# Patient Record
Sex: Female | Born: 1980 | Race: White | Hispanic: No | Smoking: Never smoker
Health system: Southern US, Community
[De-identification: ages and names within clinical notes are randomized; demographics above are authoritative.]

## PROBLEM LIST (undated history)

## (undated) DIAGNOSIS — E861 Hypovolemia: Secondary | ICD-10-CM

## (undated) DIAGNOSIS — G901 Familial dysautonomia [Riley-Day]: Secondary | ICD-10-CM

## (undated) DIAGNOSIS — E869 Volume depletion, unspecified: Secondary | ICD-10-CM

## (undated) DIAGNOSIS — K219 Gastro-esophageal reflux disease without esophagitis: Secondary | ICD-10-CM

## (undated) DIAGNOSIS — D649 Anemia, unspecified: Secondary | ICD-10-CM

## (undated) DIAGNOSIS — Z950 Presence of cardiac pacemaker: Secondary | ICD-10-CM

## (undated) DIAGNOSIS — R55 Syncope and collapse: Secondary | ICD-10-CM

## (undated) DIAGNOSIS — E538 Deficiency of other specified B group vitamins: Secondary | ICD-10-CM

## (undated) DIAGNOSIS — I10 Essential (primary) hypertension: Secondary | ICD-10-CM

## (undated) HISTORY — DX: Gastro-esophageal reflux disease without esophagitis: K21.9

## (undated) HISTORY — PX: LAPAROSCOPIC ROUX-EN-Y GASTRIC BYPASS WITH UPPER ENDOSCOPY AND REMOVAL OF LAP BAND: SHX6505

## (undated) HISTORY — DX: Essential (primary) hypertension: I10

## (undated) HISTORY — DX: Presence of cardiac pacemaker: Z95.0

## (undated) HISTORY — DX: Deficiency of other specified B group vitamins: E53.8

## (undated) HISTORY — PX: CHOLECYSTECTOMY: SHX55

## (undated) HISTORY — PX: OTHER SURGICAL HISTORY: SHX169

## (undated) HISTORY — PX: TONSILLECTOMY AND ADENOIDECTOMY: SHX28

## (undated) HISTORY — PX: LAPAROSCOPIC GASTRIC BANDING: SHX1100

## (undated) HISTORY — PX: ABDOMINAL HYSTERECTOMY: SHX81

## (undated) HISTORY — DX: Syncope and collapse: R55

## (undated) HISTORY — PX: TUBAL LIGATION: SHX77

## (undated) HISTORY — PX: PACEMAKER INSERTION: SHX728

---
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
## 2001-09-29 DIAGNOSIS — G901 Familial dysautonomia [Riley-Day]: Secondary | ICD-10-CM

## 2001-09-29 DIAGNOSIS — R55 Syncope and collapse: Secondary | ICD-10-CM

## 2001-09-29 HISTORY — DX: Syncope and collapse: R55

## 2001-09-29 HISTORY — DX: Familial dysautonomia (riley-day): G90.1

## 2002-02-02 ENCOUNTER — Emergency Department (HOSPITAL_COMMUNITY): Admission: EM | Admit: 2002-02-02 | Discharge: 2002-02-02 | Payer: Self-pay | Admitting: Emergency Medicine

## 2004-04-27 ENCOUNTER — Emergency Department (HOSPITAL_COMMUNITY): Admission: EM | Admit: 2004-04-27 | Discharge: 2004-04-27 | Payer: Self-pay | Admitting: *Deleted

## 2006-03-26 ENCOUNTER — Ambulatory Visit: Payer: Self-pay | Admitting: Internal Medicine

## 2009-12-11 ENCOUNTER — Ambulatory Visit: Payer: Self-pay | Admitting: Diagnostic Radiology

## 2009-12-11 ENCOUNTER — Emergency Department (HOSPITAL_BASED_OUTPATIENT_CLINIC_OR_DEPARTMENT_OTHER): Admission: EM | Admit: 2009-12-11 | Discharge: 2009-12-11 | Payer: Self-pay | Admitting: Emergency Medicine

## 2010-03-05 ENCOUNTER — Emergency Department (HOSPITAL_BASED_OUTPATIENT_CLINIC_OR_DEPARTMENT_OTHER): Admission: EM | Admit: 2010-03-05 | Discharge: 2010-03-05 | Payer: Self-pay | Admitting: Emergency Medicine

## 2010-03-05 ENCOUNTER — Ambulatory Visit: Payer: Self-pay | Admitting: Diagnostic Radiology

## 2010-12-16 LAB — CBC
HCT: 40.2 % (ref 36.0–46.0)
Hemoglobin: 13.7 g/dL (ref 12.0–15.0)
MCV: 89.4 fL (ref 78.0–100.0)
Platelets: 188 10*3/uL (ref 150–400)
RBC: 4.49 MIL/uL (ref 3.87–5.11)
RDW: 11.7 % (ref 11.5–15.5)
WBC: 9 10*3/uL (ref 4.0–10.5)

## 2010-12-16 LAB — DIFFERENTIAL
Basophils Absolute: 0.2 10*3/uL — ABNORMAL HIGH (ref 0.0–0.1)
Lymphs Abs: 3.2 10*3/uL (ref 0.7–4.0)

## 2010-12-16 LAB — BASIC METABOLIC PANEL
BUN: 13 mg/dL (ref 6–23)
CO2: 28 mEq/L (ref 19–32)
Calcium: 9.1 mg/dL (ref 8.4–10.5)
Chloride: 104 mEq/L (ref 96–112)
Potassium: 3.8 mEq/L (ref 3.5–5.1)

## 2010-12-16 LAB — D-DIMER, QUANTITATIVE: D-Dimer, Quant: 0.43 ug/mL-FEU (ref 0.00–0.48)

## 2010-12-23 LAB — BASIC METABOLIC PANEL WITH GFR
BUN: 11 mg/dL (ref 6–23)
GFR calc Af Amer: >60 mL/min (ref >60)
GFR calc non Af Amer: >60 mL/min (ref >60)

## 2010-12-23 LAB — URINALYSIS, ROUTINE W REFLEX MICROSCOPIC
Bilirubin Urine: NEGATIVE
Glucose, UA: NEGATIVE mg/dL
Hgb urine dipstick: NEGATIVE
Ketones, ur: NEGATIVE mg/dL
Nitrite: NEGATIVE
Protein, ur: NEGATIVE mg/dL
Specific Gravity, Urine: 1.017 (ref 1.005–1.030)
Urobilinogen, UA: 0.2 mg/dL (ref 0.0–1.0)
pH: 7 (ref 5.0–8.0)

## 2010-12-23 LAB — CBC
HCT: 42.4 % (ref 36.0–46.0)
Hemoglobin: 14.5 g/dL (ref 12.0–15.0)
MCHC: 34.2 g/dL (ref 30.0–36.0)
MCV: 90.3 fL (ref 78.0–100.0)
Platelets: 217 10*3/uL (ref 150–400)
RBC: 4.7 MIL/uL (ref 3.87–5.11)
RDW: 12.1 % (ref 11.5–15.5)
WBC: 6.7 K/uL (ref 4.0–10.5)

## 2010-12-23 LAB — DIFFERENTIAL
Basophils Absolute: 0.1 10*3/uL (ref 0.0–0.1)
Basophils Relative: 1 % (ref 0–1)
Eosinophils Absolute: 0.2 K/uL (ref 0.0–0.7)
Eosinophils Relative: 3 % (ref 0–5)
Lymphocytes Relative: 41 % (ref 12–46)
Lymphs Abs: 2.7 K/uL (ref 0.7–4.0)
Monocytes Absolute: 0.4 K/uL (ref 0.1–1.0)
Monocytes Relative: 6 % (ref 3–12)
Neutro Abs: 3.2 10*3/uL (ref 1.7–7.7)
Neutrophils Relative %: 50 % (ref 43–77)

## 2010-12-23 LAB — BASIC METABOLIC PANEL
CO2: 30 mEq/L (ref 19–32)
Calcium: 8.6 mg/dL (ref 8.4–10.5)
Chloride: 107 mEq/L (ref 96–112)
Creatinine, Ser: 0.6 mg/dL (ref 0.4–1.2)
Glucose, Bld: 111 mg/dL — ABNORMAL HIGH (ref 70–99)
Potassium: 4 mEq/L (ref 3.5–5.1)
Sodium: 146 mEq/L — ABNORMAL HIGH (ref 135–145)

## 2011-02-12 ENCOUNTER — Emergency Department (HOSPITAL_BASED_OUTPATIENT_CLINIC_OR_DEPARTMENT_OTHER)
Admission: EM | Admit: 2011-02-12 | Discharge: 2011-02-12 | Disposition: A | Discharge status: Home / Self Care | Payer: 59 | Attending: Emergency Medicine | Admitting: Emergency Medicine

## 2011-02-12 ENCOUNTER — Emergency Department (INDEPENDENT_AMBULATORY_CARE_PROVIDER_SITE_OTHER): Payer: 59

## 2011-02-12 DIAGNOSIS — N390 Urinary tract infection, site not specified: Secondary | ICD-10-CM | POA: Insufficient documentation

## 2011-02-12 DIAGNOSIS — R142 Eructation: Secondary | ICD-10-CM

## 2011-02-12 DIAGNOSIS — R110 Nausea: Secondary | ICD-10-CM

## 2011-02-12 DIAGNOSIS — E669 Obesity, unspecified: Secondary | ICD-10-CM | POA: Insufficient documentation

## 2011-02-12 DIAGNOSIS — R509 Fever, unspecified: Secondary | ICD-10-CM

## 2011-02-12 DIAGNOSIS — R109 Unspecified abdominal pain: Secondary | ICD-10-CM | POA: Insufficient documentation

## 2011-02-12 DIAGNOSIS — I4891 Unspecified atrial fibrillation: Secondary | ICD-10-CM | POA: Insufficient documentation

## 2011-02-12 LAB — DIFFERENTIAL
Basophils Absolute: 0 10*3/uL (ref 0.0–0.1)
Basophils Relative: 0 % (ref 0–1)
Eosinophils Absolute: 0.1 10*3/uL (ref 0.0–0.7)
Monocytes Relative: 8 % (ref 3–12)
Neutrophils Relative %: 46 % (ref 43–77)

## 2011-02-12 LAB — BASIC METABOLIC PANEL
Calcium: 9.1 mg/dL (ref 8.4–10.5)
Chloride: 102 mEq/L (ref 96–112)
Potassium: 4 mEq/L (ref 3.5–5.1)

## 2011-02-12 LAB — CBC
Hemoglobin: 14.2 g/dL (ref 12.0–15.0)
WBC: 6.2 10*3/uL (ref 4.0–10.5)

## 2011-02-12 LAB — URINE MICROSCOPIC-ADD ON

## 2011-02-12 LAB — URINALYSIS, ROUTINE W REFLEX MICROSCOPIC
Bilirubin Urine: NEGATIVE
Ketones, ur: NEGATIVE mg/dL
Nitrite: NEGATIVE
Protein, ur: 30 mg/dL — AB
Urobilinogen, UA: 0.2 mg/dL (ref 0.0–1.0)

## 2011-02-12 MED ORDER — IOHEXOL 300 MG/ML  SOLN
100.0000 mL | Freq: Once | INTRAMUSCULAR | Status: AC | PRN
  Administered 2011-02-12: 100 mL via INTRAVENOUS

## 2011-05-03 ENCOUNTER — Encounter: Payer: Self-pay | Admitting: Emergency Medicine

## 2011-05-03 ENCOUNTER — Emergency Department (HOSPITAL_BASED_OUTPATIENT_CLINIC_OR_DEPARTMENT_OTHER)
Admission: EM | Admit: 2011-05-03 | Discharge: 2011-05-03 | Disposition: A | Discharge status: Home / Self Care | Payer: 59 | Attending: Emergency Medicine | Admitting: Emergency Medicine

## 2011-05-03 DIAGNOSIS — L089 Local infection of the skin and subcutaneous tissue, unspecified: Secondary | ICD-10-CM | POA: Insufficient documentation

## 2011-05-03 DIAGNOSIS — IMO0002 Reserved for concepts with insufficient information to code with codable children: Secondary | ICD-10-CM | POA: Insufficient documentation

## 2011-05-03 DIAGNOSIS — L0390 Cellulitis, unspecified: Secondary | ICD-10-CM

## 2011-05-03 HISTORY — DX: Syncope and collapse: R55

## 2011-05-03 HISTORY — DX: Hypovolemia: E86.1

## 2011-05-03 HISTORY — DX: Volume depletion, unspecified: E86.9

## 2011-05-03 MED ORDER — DOXYCYCLINE HYCLATE 100 MG PO CAPS: 100.0000 mg | ORAL_CAPSULE | Freq: Two times a day (BID) | ORAL | Status: AC

## 2011-05-03 NOTE — ED Provider Notes (Signed)
History     CSN: [id number] Arrival date & time: 05/03/2011  1:01 PM  Chief Complaint  Patient presents with  . Wound Infection   HPI Comments: Pt states that her husband was recently hospitalized with mrsa/pt states that she was seen at her at her pcp office 2 days ago and they told her not anbiotics were needed at this time:pt states there was a white head on top on the area, but she picked it  Patient is a 30 y.o. female presenting with abscess. The history is provided by the patient. No language interpreter was used.  Abscess  This is a new problem. The current episode started less than one week ago. The onset was gradual. The problem has been gradually worsening. Affected Location: left elbow. The problem is moderate. The abscess is characterized by painfulness, redness and swelling. The abscess first occurred at home. Pertinent negatives include no fever. Her past medical history does not include atopy in family. Recently, medical care has been given by the PCP.    Past Medical History  Diagnosis Date  . Neurologic cardiac syncope   . Depletion of volume of extracellular fluid     History reviewed. No pertinent past surgical history.  History reviewed. No pertinent family history.  History  Substance Use Topics  . Smoking status: Never Smoker   . Smokeless tobacco: Not on file  . Alcohol Use: No    OB History    Grav Para Term Preterm Abortions TAB SAB Ect Mult Living                  Review of Systems  Constitutional: Negative for fever.  All other systems reviewed and are negative.    Physical Exam  BP 126/82  Pulse 96  Temp(Src) 99.6 F (37.6 C) (Oral)  Resp 16  SpO2 100%  Physical Exam  Nursing note and vitals reviewed. Constitutional: She is oriented to person, place, and time. She appears well-nourished.  HENT:  Head: Normocephalic.  Cardiovascular: Normal rate and regular rhythm.   Pulmonary/Chest: Effort normal and breath sounds normal.    Neurological: She is alert and oriented to person, place, and time. Coordination normal.  Skin:       ED Course  Procedures  MDM Pt requesting antibiotics without opening the area at this time      Teressa Lower, NP 05/03/11 1336

## 2011-05-03 NOTE — ED Provider Notes (Signed)
Medical screening examination/treatment/procedure(s) were performed by non-physician practitioner and as supervising physician I was immediately available for consultation/collaboration.   Yago Ludvigsen B. Bernette Mayers, MD 05/03/11 1339

## 2011-05-03 NOTE — ED Notes (Signed)
Pt has wound to left elbow.  Pt having pain and swelling to area.  No known fever.

## 2012-03-29 DIAGNOSIS — R9409 Abnormal results of other function studies of central nervous system: Secondary | ICD-10-CM | POA: Insufficient documentation

## 2012-04-05 DIAGNOSIS — Z950 Presence of cardiac pacemaker: Secondary | ICD-10-CM

## 2012-04-05 HISTORY — DX: Presence of cardiac pacemaker: Z95.0

## 2012-05-20 ENCOUNTER — Telehealth: Payer: Self-pay | Admitting: Internal Medicine

## 2012-05-20 NOTE — Telephone Encounter (Signed)
Forwarding to Dr. Klein. 

## 2012-05-20 NOTE — Telephone Encounter (Signed)
New msg Pt works for [employer] and wants to talk to you about her pacemaker. She cant get into her cardiologist until [phone]. She said her hr is in 140's please call

## 2012-05-28 NOTE — Telephone Encounter (Signed)
Dr. Klein spoke with the patient. 

## 2013-04-18 ENCOUNTER — Encounter: Payer: Self-pay | Admitting: Internal Medicine

## 2013-04-18 ENCOUNTER — Ambulatory Visit (INDEPENDENT_AMBULATORY_CARE_PROVIDER_SITE_OTHER): Payer: 59 | Admitting: Internal Medicine

## 2013-04-18 VITALS — BP 141/91 | HR 95 | Ht 70.5 in | Wt 257.2 lb

## 2013-04-18 DIAGNOSIS — Z950 Presence of cardiac pacemaker: Secondary | ICD-10-CM

## 2013-04-18 DIAGNOSIS — I10 Essential (primary) hypertension: Secondary | ICD-10-CM

## 2013-04-18 DIAGNOSIS — R55 Syncope and collapse: Secondary | ICD-10-CM

## 2013-04-18 NOTE — Progress Notes (Signed)
Slight dizziness.

## 2013-04-18 NOTE — Progress Notes (Signed)
Little dizziness @ 3 mins.

## 2013-04-18 NOTE — Patient Instructions (Addendum)
Your physician wants you to follow-up in: 4 MONTHS WITH DR Logan Bores will receive a reminder letter in the mail two months in advance. If you don't receive a letter, please call our office to schedule the follow-up appointment.

## 2013-04-18 NOTE — Progress Notes (Signed)
Little dizzy 

## 2013-04-18 NOTE — Progress Notes (Signed)
Little dizziness @ 2 mins.

## 2013-04-20 ENCOUNTER — Encounter: Payer: Self-pay | Admitting: Internal Medicine

## 2013-04-20 DIAGNOSIS — R55 Syncope and collapse: Secondary | ICD-10-CM | POA: Insufficient documentation

## 2013-04-20 DIAGNOSIS — Z950 Presence of cardiac pacemaker: Secondary | ICD-10-CM | POA: Insufficient documentation

## 2013-04-20 DIAGNOSIS — I10 Essential (primary) hypertension: Secondary | ICD-10-CM

## 2013-04-20 HISTORY — DX: Essential (primary) hypertension: I10

## 2013-04-20 NOTE — Assessment & Plan Note (Signed)
Long standing problem;  Will continue current course and arrange for consultation with the autonomic neurology service at [facility]

## 2013-04-20 NOTE — Assessment & Plan Note (Signed)
Will discontinue to infusion protocol; follow for now

## 2013-04-20 NOTE — Progress Notes (Signed)
ELECTROPHYSIOLOGY CONSULT NOTE  Patient ID: Erin Casey, MRN: [id number], DOB/AGE: 1981-08-14 32 y.o. Admit date: (Not on file) Date of Consult: 04/20/2013  Primary Physician: No primary provider on file. Primary Cardiologist: prev [facility]  Chief Complaint:  Neurocardiogenic syncope   HPI Erin Casey is a 32 y.o. female  witrh a 10 yr history of syncope with + tHUT 10 yrs ago and repeated last year with mixed response. She has been on multiple medications over the years as well as monthly Hespan+ 2L infusions She has shower intolerance heat intolerance; s/p hysterectomy ( I think i remember correctly) She underwent Biotronik CLS pacing last year following the HUT which showed significant bradycardia.  This was assoc with transient elimination of symptoms,  Even now with recurrence, however she has sufficient prodrome that she is able to lie herself down and avoid syncope  BP have become elevated  Past Medical History  Diagnosis Date  . Neurologic cardiac syncope   . Depletion of volume of extracellular fluid       Surgical History: No past surgical history on file.   Home Meds: Prior to Admission medications   Medication Sig Start Date End Date Taking? Authorizing Provider  methylphenidate (CONCERTA) 54 MG CR tablet Take 54 mg by mouth every morning.     Yes Historical Provider, MD  pantoprazole (PROTONIX) 20 MG tablet Take 20 mg by mouth daily.     Yes Historical Provider, MD    Allergies:  Allergies  Allergen Reactions  . Sulfa Antibiotics Rash    History   Social History  . Marital Status: Married    Spouse Name: N/A    Number of Children: N/A  . Years of Education: N/A   Occupational History  . Not on file.   Social History Main Topics  . Smoking status: Never Smoker   . Smokeless tobacco: Not on file  . Alcohol Use: No  . Drug Use:   . Sexually Active:    Other Topics Concern  . Not on file   Social History Narrative  . No narrative on file      No family history on file.   ROS:  Please see the history of present illness.     All other systems reviewed and negative.    Physical Exam:   Blood pressure 141/91, pulse 95, height 5' 10.5" (1.791 m), weight 257 lb 3.2 oz (116.665 kg). General: Well developed, well nourished female in no acute distress. Head: Normocephalic, atraumatic, sclera non-icteric, no xanthomas, nares are without discharge. EENT: normal Lymph Nodes:  none Back: without scoliosis/kyphosis , no CVA tendersness Neck: Negative for carotid bruits. JVD not elevated. Lungs: Clear bilaterally to auscultation without wheezes, rales, or rhonchi. Breathing is unlabored. Device pocket well healed; without hematoma or erythema.  There is no tethering Heart: RRR with S1 S2. No   murmur , rubs, or gallops appreciated. Abdomen: Soft, non-tender, non-distended with normoactive bowel sounds. No hepatomegaly. No rebound/guarding. No obvious abdominal masses. Msk:  Strength and tone appear normal for age. Extremities: No clubbing or cyanosis. No  edema.  Distal pedal pulses are 2+ and equal bilaterally. Skin: Warm and Dry Neuro: Alert and oriented X 3. CN III-XII intact Grossly normal sensory and motor function . Psych:  Responds to questions appropriately with a normal affect.      Labs: Cardiac Enzymes No results found for this basename: CKTOTAL, CKMB, TROPONINI,  in the last 72 hours CBC Lab Results  Component Value Date  WBC 6.2 02/12/2011   HGB 14.2 02/12/2011   HCT 40.2 02/12/2011   MCV 86.3 02/12/2011   PLT 202 02/12/2011   Chemistry   Radiology/Studies:  No results found.  EKG:  Normal sinus   Assessment and Plan:    Erin Casey

## 2013-04-20 NOTE — Assessment & Plan Note (Signed)
The patient's device was interrogated.  The information was reviewed. No changes were made in the programming.    

## 2013-04-21 LAB — PACEMAKER DEVICE OBSERVATION
AL AMPLITUDE: 5.8 mv
AL IMPEDENCE PM: 526 Ohm
AL THRESHOLD: 0.6 V
ATRIAL PACING PM: 51
DEVICE MODEL PM: 66303845
RV LEAD IMPEDENCE PM: 624 Ohm
VENTRICULAR PACING PM: 0

## 2013-04-22 ENCOUNTER — Other Ambulatory Visit: Payer: Self-pay | Admitting: *Deleted

## 2013-04-22 DIAGNOSIS — R55 Syncope and collapse: Secondary | ICD-10-CM

## 2013-05-09 ENCOUNTER — Encounter: Payer: 59 | Admitting: Internal Medicine

## 2013-05-10 ENCOUNTER — Encounter: Payer: Self-pay | Admitting: Cardiovascular Disease

## 2013-05-31 ENCOUNTER — Telehealth: Payer: Self-pay | Admitting: *Deleted

## 2013-05-31 NOTE — Telephone Encounter (Signed)
Spoke with Dr. Purnell Shoemaker office - pt having EEG on 10/1, after that they will determine scheduling office visit

## 2013-06-13 ENCOUNTER — Telehealth: Payer: Self-pay | Admitting: *Deleted

## 2013-06-13 NOTE — Telephone Encounter (Signed)
Patient states a cardiologist at [facility], Luella Cook, MD ordered her concerta 54 mg cr 3 years ago and she's been on it since then, she is no longer seeing that MD and is in process of having records transferred here, she is seeing Dr Graciela Husbands, she states she takes this medication to increase B/P and heart rate NOT for ADHD, she has 2 pills left and asks if Dr Graciela Husbands will refill this medication for her, I will route note to him.  Dr Graciela Husbands let me know and I will call her, order med if approved. Thanks, Addison Lank, RN Patient Advocate

## 2013-06-13 NOTE — Telephone Encounter (Signed)
Patient called in requesting refill on Concerta. She stated that she spoke to a nurse last week and was told that Dr. Graciela Husbands normally doesn't prescribe this, but that they would ask him and see. She never heard anything back, and she is down to 2 pills.

## 2013-06-14 NOTE — Telephone Encounter (Signed)
Hand written prescriptions left at front desk per pt request.

## 2013-06-30 ENCOUNTER — Telehealth: Payer: Self-pay | Admitting: Internal Medicine

## 2013-06-30 NOTE — Telephone Encounter (Signed)
New problem   Pt need a note stated she couldn't take her meds on 9/29 and 9/30 to prep for test she had done on 10/1. Please fax to [phone].

## 2013-07-01 ENCOUNTER — Encounter: Payer: Self-pay | Admitting: *Deleted

## 2013-07-01 NOTE — Telephone Encounter (Signed)
Follow up    Status of letter to be fax ,.

## 2013-07-01 NOTE — Telephone Encounter (Signed)
Faxed work excuse letter per request. Patient aware.

## 2013-07-04 ENCOUNTER — Telehealth: Payer: Self-pay | Admitting: *Deleted

## 2013-07-04 NOTE — Telephone Encounter (Signed)
Faxed work excuse to [phone] per patient request

## 2013-07-12 ENCOUNTER — Telehealth: Payer: Self-pay | Admitting: Internal Medicine

## 2013-07-12 NOTE — Telephone Encounter (Signed)
New Problem  Pt wants to make sure that the test results are in from [facility].

## 2013-07-14 NOTE — Telephone Encounter (Signed)
Let patient know that Dr Graciela Husbands will take a look at the records next week and we would be in touch. Patient agreeable with plan.

## 2013-07-20 ENCOUNTER — Telehealth: Payer: Self-pay | Admitting: *Deleted

## 2013-07-20 NOTE — Telephone Encounter (Signed)
I called patient to let her know that we received her test results from [facility]. Patient shared with me her disappointment in the doctor and his attitude. She states that he was very condescending to her and didn't even ask her about her medications. She states that when he first entered the room the first thing he says to her is that she is too young for a pacemaker. She says that she is cancelling her appointment on 10/31 because she does not want to visit with this doctor again. I explained that I would pass this along to Dr. Graciela Husbands and see about scheduling her an appointment for next month for follow up. Patient verbalized understanding and agreeable to plan.

## 2013-07-26 NOTE — Telephone Encounter (Signed)
LMTCB to schedule follow up office visit with Dr. Graciela Husbands in November.

## 2013-08-05 ENCOUNTER — Telehealth: Payer: Self-pay | Admitting: Internal Medicine

## 2013-08-05 NOTE — Telephone Encounter (Signed)
ROI faxed to [facility] at [phone]  08/05/13/Km

## 2013-08-09 ENCOUNTER — Encounter: Payer: Self-pay | Admitting: *Deleted

## 2013-08-18 ENCOUNTER — Ambulatory Visit (INDEPENDENT_AMBULATORY_CARE_PROVIDER_SITE_OTHER): Payer: 59 | Admitting: Internal Medicine

## 2013-08-18 ENCOUNTER — Encounter: Payer: Self-pay | Admitting: Internal Medicine

## 2013-08-18 ENCOUNTER — Encounter (INDEPENDENT_AMBULATORY_CARE_PROVIDER_SITE_OTHER): Payer: Self-pay

## 2013-08-18 ENCOUNTER — Encounter: Payer: Self-pay | Admitting: *Deleted

## 2013-08-18 VITALS — BP 117/79 | HR 88 | Ht 70.0 in | Wt 257.0 lb

## 2013-08-18 DIAGNOSIS — R55 Syncope and collapse: Secondary | ICD-10-CM

## 2013-08-18 DIAGNOSIS — Z950 Presence of cardiac pacemaker: Secondary | ICD-10-CM

## 2013-08-18 DIAGNOSIS — I10 Essential (primary) hypertension: Secondary | ICD-10-CM

## 2013-08-18 LAB — MDC_IDC_ENUM_SESS_TYPE_INCLINIC
Brady Statistic RV Percent Paced: 0 %
Lead Channel Impedance Value: 565 Ohm
Lead Channel Impedance Value: 604 Ohm
Lead Channel Pacing Threshold Amplitude: 0.5 V
Lead Channel Pacing Threshold Amplitude: 0.9 V
Lead Channel Pacing Threshold Pulse Width: 0.4 ms
Lead Channel Pacing Threshold Pulse Width: 0.4 ms

## 2013-08-18 NOTE — Progress Notes (Signed)
.  sk   ELECTROPHYSIOLOGY CONSULT NOTE  Patient ID: Erin Casey, MRN: [id number], DOB/AGE: 03-10-1981 32 y.o. Admit date: (Not on file) Date of Consult: 08/18/2013  Primary Physician: Brooke Bonito, MD Primary Cardiologist: prev [facility]  Chief Complaint:  Neurocardiogenic syncope   HPI Erin Casey is a 32 y.o. female  Seen in followup for neurocardiogenic syncope for which she recently underwent Biotronik pacemaker implantation. She has been treated with multiple different medications. She notes recently that she has developed alopecia which is identified and 10% of patients taking methylphenidate.  She is also significant problems with worsening dizziness without obvious extrinsic trigger. There has been no problems with intercurrent infection, psychosocial stress etc.  Her urine volume is copious and clear. She is not sure about her salt intake. It is her impression that she feels better with her blood pressure 130-40 range and most recently has been in the 110 range.   Past Medical History  Diagnosis Date  . Neurologic cardiac syncope   . Depletion of volume of extracellular fluid   . 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 Essential hypertension 04/20/2013      Surgical History: No past surgical history on file.   Home Meds: Prior to Admission medications   Medication Sig Start Date End Date Taking? Authorizing Provider  methylphenidate (CONCERTA) 54 MG CR tablet Take 54 mg by mouth every morning.     Yes Historical Provider, MD  pantoprazole (PROTONIX) 20 MG tablet Take 20 mg by mouth daily.     Yes Historical Provider, MD    Allergies:  Allergies  Allergen Reactions  . Sulfa Antibiotics Rash    History   Social History  . Marital Status: Married    Spouse Name: N/A    Number of Children: N/A  . Years of Education: N/A   Occupational History  . Not on file.   Social History Main Topics  . Smoking status: Never Smoker   . Smokeless tobacco: Not on file  .  Alcohol Use: No  . Drug Use:   . Sexual Activity:    Other Topics Concern  . Not on file   Social History Narrative  . No narrative on file     No family history on file.   ROS:  Please see the history of present illness.     All other systems reviewed and negative.    Physical Exam:   Blood pressure 117/79, pulse 88, height 5\' 10"  (1.778 m), weight 257 lb (116.574 kg). Well developed and nourished in no acute distress HENT normal Neck supple with JVP-flat Clear Regular rate and rhythm, no murmurs or gallops Abd-soft with active BS No Clubbing cyanosis edema Skin-warm and dry A & Oriented  Grossly normal sensory and motor function     Labs: Cardiac Enzymes No results found for this basename: CKTOTAL, CKMB, TROPONINI,  in the last 72 hours CBC Lab Results  Component Value Date   WBC 6.2 02/12/2011   HGB 14.2 02/12/2011   HCT 40.2 02/12/2011   MCV 86.3 02/12/2011   PLT 202 02/12/2011   Chemistry   Radiology/Studies:  No results found.  EKG:  Normal sinus   Assessment and Plan:    Sherryl Manges

## 2013-08-18 NOTE — Assessment & Plan Note (Signed)
As above.

## 2013-08-18 NOTE — Assessment & Plan Note (Signed)
The patient's device was interrogated.  The information was reviewed. No changes were made in the programming.    

## 2013-08-18 NOTE — Assessment & Plan Note (Signed)
She has been having more symptoms of lightheadedness. She notes in association with her blood pressure is in the 110 range versus the 1 30 range. She is taking methylphenidate for blood pressure as well as heart rate. Her pacemaker with CLS obviously need for heart rate. Alternatives may be used for methylphenidate as it seems to be associated with alopecia. We will leave her off of it.  We'll begin her ProAmatine 2.5 mg 3 times a day. We will also measure urine sodium excretion. She sounds like she is well-hydrated.

## 2013-08-18 NOTE — Patient Instructions (Addendum)
Your physician has recommended you make the following change in your medication:  1) Decrease Methylphenidate to 36 mg daily 2) Start Proamatine 2.5 mg three times a day   Remote monitoring is used to monitor your Pacemaker of ICD from home. This monitoring reduces the number of office visits required to check your device to one time per year. It allows [place] to keep an eye on the functioning of your device to ensure it is working properly. You are scheduled for a device check from home on 11/24/2013. You may send your transmission at any time that day. If you have a wireless device, the transmission will be sent automatically. After your physician reviews your transmission, you will receive a postcard with your next transmission date.  Your physician wants you to follow-up in: one year with Dr. Graciela Husbands.  You will receive a reminder letter in the mail two months in advance. If you don't receive a letter, please call our office to schedule the follow-up appointment.   Your physician recommends that you return for lab work in: 24 hour urine sodium

## 2013-08-19 ENCOUNTER — Telehealth: Payer: Self-pay | Admitting: Internal Medicine

## 2013-08-19 NOTE — Telephone Encounter (Signed)
Records REc From [facility] gave to scheduling Dept 08/19/13/KM

## 2013-08-23 ENCOUNTER — Other Ambulatory Visit: Payer: 59

## 2013-08-31 ENCOUNTER — Telehealth: Payer: Self-pay | Admitting: Internal Medicine

## 2013-08-31 NOTE — Telephone Encounter (Signed)
Pt was told that dr/nurse were out of the office today and will receive a call back tomorrow. Pt agreed to plan.

## 2013-08-31 NOTE — Telephone Encounter (Signed)
New Message   Pt called states that she was being weaned off of her medication/// She believes that she should stay on her medication because with out it she feels sick most of the time/// please call back to discuss.

## 2013-09-02 NOTE — Telephone Encounter (Signed)
Spoke with patient and advised her to resume previous dosage of Methylphenidate 54 mg daily. Patient states she began resumption of this two days ago. She is requesting new prescriptions to be written. I explained that we would get these to her next week. Patient agreeable to plan.

## 2013-09-02 NOTE — Telephone Encounter (Signed)
Follow Up:  Pt states she is still waiting on Sherri's call.

## 2013-09-09 NOTE — Telephone Encounter (Signed)
3 handwritten prescriptions mailed to patient. Dated 09/07/13, 10/08/2013, & 11/08/2013. Patient requested I mail them.

## 2013-10-04 ENCOUNTER — Telehealth: Payer: Self-pay | Admitting: *Deleted

## 2013-10-04 NOTE — Telephone Encounter (Signed)
Called pt to discuss 24 hour urine Dr. Caryl Comes recommended as last office visit. She states she will do it on a Sunday and drop off at our office Monday.

## 2013-11-03 ENCOUNTER — Telehealth: Payer: Self-pay | Admitting: Internal Medicine

## 2013-11-03 NOTE — Telephone Encounter (Signed)
Left message informing pt that short stay orders faxed over for 2L NS (1000cc/hr) once monthly prn.

## 2013-11-03 NOTE — Telephone Encounter (Signed)
New Problem:  Pt is requesting to have IV Fluids. Pt would like to speak to Sherri.

## 2013-11-21 ENCOUNTER — Encounter: Payer: 59 | Admitting: *Deleted

## 2013-12-06 ENCOUNTER — Encounter: Payer: Self-pay | Admitting: *Deleted

## 2014-03-01 ENCOUNTER — Encounter: Payer: Self-pay | Admitting: Cardiology

## 2014-07-07 ENCOUNTER — Encounter: Payer: Self-pay | Admitting: Cardiology

## 2014-08-23 ENCOUNTER — Encounter: Payer: Self-pay | Admitting: *Deleted

## 2014-09-26 ENCOUNTER — Encounter: Payer: Self-pay | Admitting: *Deleted

## 2014-10-27 ENCOUNTER — Encounter: Payer: Self-pay | Admitting: *Deleted

## 2014-11-23 ENCOUNTER — Encounter: Payer: Self-pay | Admitting: *Deleted

## 2014-12-28 ENCOUNTER — Encounter: Payer: Self-pay | Admitting: *Deleted

## 2015-01-24 ENCOUNTER — Encounter: Payer: Self-pay | Admitting: *Deleted

## 2015-11-16 ENCOUNTER — Encounter: Payer: Self-pay | Admitting: Physician Assistant

## 2015-11-16 DIAGNOSIS — G43009 Migraine without aura, not intractable, without status migrainosus: Secondary | ICD-10-CM | POA: Insufficient documentation

## 2015-11-16 DIAGNOSIS — E6609 Other obesity due to excess calories: Secondary | ICD-10-CM | POA: Insufficient documentation

## 2015-12-29 ENCOUNTER — Encounter: Payer: Self-pay | Admitting: Physician Assistant

## 2016-01-01 ENCOUNTER — Telehealth: Payer: Self-pay | Admitting: Internal Medicine

## 2016-01-01 NOTE — Telephone Encounter (Signed)
New Message  Pt stated that her pacer is keeping her HR very high  And SOB and wanted to know if she can come in before her appt w/ Renee. Please call back and discuss.

## 2016-01-01 NOTE — Telephone Encounter (Signed)
Spoke with [name]- she says that she has been SOB and having an increased HR since Saturday. She reports that she went to [facility] on Saturday where they checked her PPM and she says that they reported normal PPM function/ no changes made. Our office has not seen her since 07/2013. She says that she works for [employer] and coworkers put her on the monitor and she noticed a 3 second pause. She is working on getting the pause episode printed and will fax it to [place] and I can review it with Dr. Lovena Le. I have advised her that if her symptoms persist she should go to the ED- she is concerned that ED staff are not educated as to how to treat her PPM- I informed her that they can always contact a Biotron[employer] representative to the ED. She has an appt 01/03/16 at 8am with Tommye Standard, PA.  I will call her back if any recommendations are made after pause reviewed. She verbalizes understanding.

## 2016-01-02 NOTE — Progress Notes (Signed)
Cardiology Office Note Date:  01/03/2016  Patient ID:  Erin Casey, DOB 29-Dec-1980, MRN [id number] PCP:  Reita Cliche, MD  Cardiologist/Electrophysiologist: Dr. Caryl Comes    Chief Complaint:  Palpitations, reports of pause on a monitor, SOB  History of Present Illness: Erin Casey is a 35 y.o. female with history of neurocardiogenic syncopeawith PPM/CLS device, nd HTN comes to the office to seen for Dr. Caryl Comes.  She last saw him Nov 2014, at that time she was started on proamatine with worsening dizziness.  2.5mg  TID, though was subsequently stopped. Records note historically treated with many different medications including monthly Hespan and IVF infusions.  She has been followed with Dr. Tobie Poet since her last visit here secondary to her insurance coverage but would prefer to resume care here.  She was being seen and having her pacer checks regularly, last seen there sometime in the fall and has been doing quite well until this past weekend.  She comes to the office today c/o fast heart rates, increased dizziness and a near syncopal event.  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  She noted when she would feel like her heart was racing she put the monitor on and was atrial paced in 130's, this would persistent then she would feel lightheaded and SOB.  It is her perception that this is much different then her historical symptoms, in that the tachycardia is the provoking or first symptom and has noted this being pace rhythm when it happens.  She was evaluated last weekend at [facility] and reportedly had her pacer checked with normal function. She has not had CP and no syncope.  She keeps adequately hydrated, and until this past weekend had been doing very well, no changes in any medications, foods, no illness or identifiable trigger.  Past Medical History  Diagnosis Date  . Neurologic  cardiac syncope   . Depletion of volume of extracellular fluid   . 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 Essential hypertension 04/20/2013    Past Surgical History  Procedure Laterality Date  . Hysterectomy      Current Outpatient Prescriptions  Medication Sig Dispense Refill  . ketorolac (TORADOL) 10 MG tablet Take 10 mg by mouth daily as needed. Migraine    . methylphenidate (CONCERTA) 54 MG CR tablet Take 54 mg by mouth every morning.      . pantoprazole (PROTONIX) 20 MG tablet Take 20 mg by mouth daily.      . prochlorperazine (COMPAZINE) 10 MG tablet Take 10 mg by mouth daily as needed. Migraine     No current facility-administered medications for this visit.    Allergies:   Sulfa antibiotics; Amoxicillin-pot clavulanate; and Sulfamethoxazole   Social History:  The patient  reports that she has never smoked. She does not have any smokeless tobacco history on file. She reports that she does not drink alcohol.   Family History:  The patient's Grandparents with heart disease, her Mom hs DM, her father has HTN  ROS:  Please see the history of present illness.    All other systems are reviewed and otherwise negative.   PHYSICAL EXAM:  VS:  BP 124/70 mmHg  Pulse 78  Ht 5\' 10"  (1.778 m)  Wt 224 lb 3.2 oz (101.696 kg)  BMI 32.17 kg/m2 BMI: Body mass index is 32.17 kg/(m^2). Well nourished, well developed, in no acute distress HEENT: normocephalic, atraumatic Neck:  no JVD, carotid bruits or masses Cardiac:  normal S1, S2; RRR; no significant murmurs, no rubs, or gallops Lungs:  clear to auscultation bilaterally, no wheezing, rhonchi or rales Abd: soft, nontender MS: no deformity or atrophy Ext: no edema Skin: warm and dry, no rash Neuro:  No gross deficits appreciated Psych: euthymic mood, full affect  PPM site is stable, no tethering or discomfort   EKG:  Done today and reviewed by myself shows Atrial paced rhythm PPM interrogation today shows: done by industry, normal  device function, she had an SVT in February that she does not recall any symptoms with and in Dec one NSVT of 11 beats, also without reported symptoms.  Battery status is OK.  Recent Labs: LABS from from [facility]: 11/16/15: UA wnl, lipid profile OK, Trop <0.006, K+ 3.9, BUN/Creat 11/0.69, BS 103, CBC OK, TSH wnl and CXR without acute process   Wt Readings from Last 3 Encounters:  01/03/16 224 lb 3.2 oz (101.696 kg)  08/18/13 257 lb (116.574 kg)  04/18/13 257 lb 3.2 oz (116.665 kg)     Other studies reviewed: Additional studies/records reviewed today include: summarized above  DEVICE information: [employer] Sci, dual chamber PPM, CLS device, implanted 04/05/12 by Dr. Rosita Fire  ASSESSMENT AND PLAN:  1. Neurocardiogenic syncope      hx of very remote + HUT with significant bradycardic response     Orthostatic vitals are negative today     Pacer check today shows normal function, as noted above  2. HTN ?     Normotensive today  3. Palpitations, as decribed above     Plan as below   Disposition: She is asked to keep a BP log/symtptom diary to try and identify any triggers/patterns, reviewed the case with Dr. Curt Bears, her CLS response is on very high setting, recommended decrease to high and monitor her symptoms.  We will get an echo doppler and see her back in 4-6 weeks, sooner if needed.  Current medicines are reviewed at length with the patient today.  The patient did not have any concerns regarding medicines.  Haywood Lasso, PA-C 01/03/2016 8:53 AM     [address] [place] [address] [phone] (office)  [phone] (fax)

## 2016-01-03 ENCOUNTER — Ambulatory Visit (INDEPENDENT_AMBULATORY_CARE_PROVIDER_SITE_OTHER): Payer: 59 | Admitting: Physician Assistant

## 2016-01-03 ENCOUNTER — Encounter: Payer: Self-pay | Admitting: Physician Assistant

## 2016-01-03 VITALS — BP 124/70 | HR 78 | Ht 70.0 in | Wt 224.2 lb

## 2016-01-03 DIAGNOSIS — R55 Syncope and collapse: Secondary | ICD-10-CM

## 2016-01-03 DIAGNOSIS — R002 Palpitations: Secondary | ICD-10-CM | POA: Diagnosis not present

## 2016-01-03 NOTE — Patient Instructions (Signed)
Medication Instructions:   Your physician recommends that you continue on your current medications as directed. Please refer to the Current Medication list given to you today.   If you need a refill on your cardiac medications before your next appointment, please call your pharmacy.  Labwork: NONE ORDER TODAY    Testing/Procedures:   Your physician has requested that you have an echocardiogram. Echocardiography is a painless test that uses sound waves to create images of your heart. It provides your doctor with information about the size and shape of your heart and how well your heart's chambers and valves are working. This procedure takes approximately one hour. There are no restrictions for this procedure.   Follow-Up: IN 4 TO 6 WEEKS WITH KLEIN OR RENEE ON A DAY KLEIN IN OFFICE   Any Other Special Instructions Will Be Listed Below (If Applicable).

## 2016-01-18 ENCOUNTER — Other Ambulatory Visit: Payer: Self-pay

## 2016-01-18 ENCOUNTER — Ambulatory Visit (HOSPITAL_COMMUNITY): Payer: 59 | Attending: Cardiology

## 2016-01-18 DIAGNOSIS — I071 Rheumatic tricuspid insufficiency: Secondary | ICD-10-CM | POA: Diagnosis not present

## 2016-01-18 DIAGNOSIS — I10 Essential (primary) hypertension: Secondary | ICD-10-CM | POA: Diagnosis not present

## 2016-01-18 DIAGNOSIS — R002 Palpitations: Secondary | ICD-10-CM | POA: Diagnosis not present

## 2016-01-22 ENCOUNTER — Telehealth: Payer: Self-pay | Admitting: Internal Medicine

## 2016-01-22 NOTE — Telephone Encounter (Signed)
New Message Pt calling to discuss ECHO results. Please call back and discuss.

## 2016-01-22 NOTE — Telephone Encounter (Signed)
Echo was ordered by Joseph Art, will forward to her to please review.

## 2016-01-24 ENCOUNTER — Telehealth: Payer: Self-pay | Admitting: *Deleted

## 2016-01-24 NOTE — Telephone Encounter (Signed)
spoke to patient about results and pt verbalized understanding

## 2016-02-05 ENCOUNTER — Encounter: Payer: Self-pay | Admitting: Internal Medicine

## 2016-02-05 ENCOUNTER — Ambulatory Visit (INDEPENDENT_AMBULATORY_CARE_PROVIDER_SITE_OTHER): Payer: 59 | Admitting: Internal Medicine

## 2016-02-05 VITALS — BP 110/60 | HR 89 | Ht 70.0 in | Wt 229.6 lb

## 2016-02-05 DIAGNOSIS — R55 Syncope and collapse: Secondary | ICD-10-CM

## 2016-02-05 DIAGNOSIS — R002 Palpitations: Secondary | ICD-10-CM

## 2016-02-05 LAB — CUP PACEART INCLINIC DEVICE CHECK
Date Time Interrogation Session: 20170509180800
Implantable Lead Implant Date: 20130708
Implantable Lead Implant Date: 20130708
Implantable Lead Location: 753859
Implantable Lead Model: 350
Implantable Lead Serial Number: 29231638
Lead Channel Impedance Value: 546 Ohm
Lead Channel Pacing Threshold Amplitude: 1.1 V
Lead Channel Pacing Threshold Amplitude: 1.1 V
Lead Channel Pacing Threshold Pulse Width: 0.4 ms
Lead Channel Sensing Intrinsic Amplitude: 15.7 mV
Lead Channel Sensing Intrinsic Amplitude: 3.9 mV
Lead Channel Sensing Intrinsic Amplitude: 3.9 mV
Lead Channel Setting Pacing Amplitude: 2 V
Lead Channel Setting Pacing Amplitude: 2.4 V
Lead Channel Setting Pacing Pulse Width: 0.4 ms
MDC IDC LEAD LOCATION: 753860
MDC IDC LEAD SERIAL: 29222139
MDC IDC MSMT LEADCHNL RA PACING THRESHOLD AMPLITUDE: 0.5 V
MDC IDC MSMT LEADCHNL RA PACING THRESHOLD AMPLITUDE: 0.5 V
MDC IDC MSMT LEADCHNL RA PACING THRESHOLD PULSEWIDTH: 0.4 ms
MDC IDC MSMT LEADCHNL RA PACING THRESHOLD PULSEWIDTH: 0.4 ms
MDC IDC MSMT LEADCHNL RV IMPEDANCE VALUE: 546 Ohm
MDC IDC MSMT LEADCHNL RV PACING THRESHOLD PULSEWIDTH: 0.4 ms
MDC IDC PG SERIAL: 66303845

## 2016-02-05 NOTE — Patient Instructions (Signed)
Your physician recommends that you continue on your current medications as directed. Please refer to the Current Medication list given to you today.  Your physician wants you to follow-up in: 6 months with Marinus Maw.   You will receive a reminder letter in the mail two months in advance. If you don't receive a letter, please call our office to schedule the follow-up appointment.  Your physician wants you to follow-up in: 12 months with Dr. Caryl Comes.   You will receive a reminder letter in the mail two months in advance. If you don't receive a letter, please call our office to schedule the follow-up appointment.  Remote monitoring is used to monitor your Pacemaker of ICD from home. This monitoring reduces the number of office visits required to check your device to one time per year. It allows [place] to keep an eye on the functioning of your device to ensure it is working properly. You are scheduled for a device check from home on 05/06/16. You may send your transmission at any time that day. If you have a wireless device, the transmission will be sent automatically. After your physician reviews your transmission, you will receive a postcard with your next transmission date.

## 2016-02-05 NOTE — Progress Notes (Signed)
      Patient Care Team: Reita Cliche, MD as PCP - General (Internal Medicine)   HPI  Erin Casey is a 35 y.o. 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  She has struggled variably in the past with fluid responsive hypotension.  She's been seen mostly by Dr. Tobie Poet.  She is transferring her care here  Dizziness has not been too much of an issue. She has been exercising. This has helped significantly. She is down 40+ pounds.  Fluid can be a come and go problem sometimes she feels volume overloaded;  Echo 4/17 was normal  Records and Results Reviewed from [facility]  Past Medical History  Diagnosis Date  . Neurologic cardiac syncope   . Depletion of volume of extracellular fluid   . 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 Essential hypertension 04/20/2013    Past Surgical History  Procedure Laterality Date  . Hysterectomy      Current Outpatient Prescriptions  Medication Sig Dispense Refill  . ketorolac (TORADOL) 10 MG tablet Take 10 mg by mouth daily as needed. Migraine    . methylphenidate (CONCERTA) 54 MG CR tablet Take 54 mg by mouth every morning.      . pantoprazole (PROTONIX) 40 MG tablet Take 40 mg by mouth daily.     . prochlorperazine (COMPAZINE) 10 MG tablet Take 10 mg by mouth daily as needed. Migraine     No current facility-administered medications for this visit.    Allergies  Allergen Reactions  . Sulfa Antibiotics Rash and Hives    Nausea  . Amoxicillin-Pot Clavulanate Other (See Comments)    [Derm] [GI]  . Sulfamethoxazole Rash    vomiting      Review of Systems negative except from HPI and PMH  Physical Exam BP 110/60 mmHg  Pulse 89  Ht 5\' 10"  (1.778 m)  Wt 229 lb 9.6 oz (104.146 kg)  BMI 32.94 kg/m2  SpO2 97% Well developed and well nourished in no acute distress HENT normal E scleral and icterus clear Neck Supple JVP flat; carotids brisk and full Clear  to ausculation Device pocket well healed; without hematoma or erythema.  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 There is no VA linking; this is most consistent with sinus versus atrial tachycardia. Electrograms were quite close to sinus. Likely sinus tachycardia and not withstanding our  Blood pressures are reasonably controlled on labetalol  She manages her salt and water quite well  She is continuing to exercise with weight loss  We'll arrange for the transfer of remote monitoring from [facility]

## 2016-05-06 ENCOUNTER — Encounter: Payer: 59 | Admitting: *Deleted

## 2016-05-12 ENCOUNTER — Telehealth: Payer: Self-pay | Admitting: *Deleted

## 2016-05-12 NOTE — Telephone Encounter (Signed)
I guess the concerat was started at [facility]  It can be refilled thanks

## 2016-05-12 NOTE — Telephone Encounter (Signed)
Pt requesting written prescription for concerta, she states she takes it for low BP. Pt states she took her last pill today and needs written prescription today.  I reviewed with Dr Marlou Porch ([facility])-he would not be able to provide prescription for concerta.  Pt advised I will forward to Dr Caryl Comes for review, pt is aware Dr Caryl Comes not in office today.

## 2016-05-12 NOTE — Telephone Encounter (Signed)
No answer, no voicemail.

## 2016-05-12 NOTE — Telephone Encounter (Signed)
Patient called and requested a hand written/printed script for the concerta. She would like to pick this up today. She can be reached at [phone] or [phone]. Thanks, MI

## 2016-05-13 NOTE — Telephone Encounter (Signed)
Pt advised Dr Caryl Comes will refill Concerta. Pt advised she can come to office this afternoon and pick up written prescription.

## 2016-05-13 NOTE — Telephone Encounter (Signed)
RX refilled by Dr. Caryl Comes- Concerta CR 54 mg one tab by mouth once daily. 3 separtate RX's written and placed at the front desk.

## 2016-08-14 ENCOUNTER — Other Ambulatory Visit: Payer: Self-pay | Admitting: *Deleted

## 2016-08-14 MED ORDER — METHYLPHENIDATE HCL ER (OSM) 54 MG PO TBCR: 54.0000 mg | EXTENDED_RELEASE_TABLET | ORAL | Status: DC

## 2016-08-14 NOTE — Telephone Encounter (Signed)
Prescriptions for Concerta CR 54 mg one tab po daily written #30 w/ no refills for November, December, & January. The patient is aware that these will be placed at the front desk for pick up.

## 2016-08-14 NOTE — Telephone Encounter (Signed)
Patient called and requested a refill on the concerta. She states that she is out of the medication and would like to pick this up today. She can be reached at [phone] or [phone]. Thanks, MI

## 2016-10-13 ENCOUNTER — Ambulatory Visit (INDEPENDENT_AMBULATORY_CARE_PROVIDER_SITE_OTHER): Payer: 59 | Admitting: Family Medicine

## 2016-10-13 VITALS — BP 120/82 | HR 117 | Temp 97.9°F | Resp 18 | Ht 70.0 in | Wt 236.0 lb

## 2016-10-13 DIAGNOSIS — R059 Cough, unspecified: Secondary | ICD-10-CM

## 2016-10-13 DIAGNOSIS — R69 Illness, unspecified: Secondary | ICD-10-CM | POA: Diagnosis not present

## 2016-10-13 DIAGNOSIS — J111 Influenza due to unidentified influenza virus with other respiratory manifestations: Secondary | ICD-10-CM

## 2016-10-13 DIAGNOSIS — R05 Cough: Secondary | ICD-10-CM | POA: Diagnosis not present

## 2016-10-13 DIAGNOSIS — R197 Diarrhea, unspecified: Secondary | ICD-10-CM

## 2016-10-13 MED ORDER — BENZONATATE 100 MG PO CAPS: 100.0000 mg | ORAL_CAPSULE | Freq: Three times a day (TID) | ORAL | 0 refills | Status: DC | PRN

## 2016-10-13 MED ORDER — HYDROCODONE-HOMATROPINE 5-1.5 MG/5ML PO SYRP: ORAL_SOLUTION | ORAL | 0 refills | Status: DC

## 2016-10-13 NOTE — Patient Instructions (Addendum)
Your symptoms are likely due to a virus. Tessalon Perles up to 3 times per day as needed for cough, or hydrocodone up to every 6 hours, but typically use this at bedtime. Small sips of fluids frequently, and if diarrhea is not improving in the next 2-3 days, may need to look at other testing.  If your cough is not improving by the end of the week, let me know and I can possibly send in an antibiotic at that time.  Sign up for my chart today for easiest way to email [place] with your updates.  Return to the clinic or go to the nearest emergency room if any of your symptoms worsen or new symptoms occur.   Diarrhea, Adult Diarrhea is frequent loose and watery bowel movements. Diarrhea can make you feel weak and cause you to become dehydrated. Dehydration can make you tired and thirsty, cause you to have a dry mouth, and decrease how often you urinate. Diarrhea typically lasts 2-3 days. However, it can last longer if it is a sign of something more serious. It is important to treat your diarrhea as told by your health care provider. Follow these instructions at home: Eating and drinking Follow these recommendations as told by your health care provider:  Take an oral rehydration solution (ORS). This is a drink that is sold at pharmacies and retail stores.  Drink clear fluids, such as water, ice chips, diluted fruit juice, and low-calorie sports drinks.  Eat bland, easy-to-digest foods in small amounts as you are able. These foods include bananas, applesauce, rice, lean meats, toast, and crackers.  Avoid drinking fluids that contain a lot of sugar or caffeine, such as energy drinks, sports drinks, and soda.  Avoid alcohol.  Avoid spicy or fatty foods. General instructions  Drink enough fluid to keep your urine clear or pale yellow.  Wash your hands often. If soap and water are not available, use hand sanitizer.  Make sure that all people in your household wash their hands well and often.  Take  over-the-counter and prescription medicines only as told by your health care provider.  Rest at home while you recover.  Watch your condition for any changes.  Take a warm bath to relieve any burning or pain from frequent diarrhea episodes.  Keep all follow-up visits as told by your health care provider. This is important. Contact a health care provider if:  You have a fever.  Your diarrhea gets worse.  You have new symptoms.  You cannot keep fluids down.  You feel light-headed or dizzy.  You have a headache  You have muscle cramps. Get help right away if:  You have chest pain.  You feel extremely weak or you faint.  You have bloody or black stools or stools that look like tar.  You have severe pain, cramping, or bloating in your abdomen.  You have trouble breathing or you are breathing very quickly.  Your heart is beating very quickly.  Your skin feels cold and clammy.  You feel confused.  You have signs of dehydration, such as:  Dark urine, very little urine, or no urine.  Cracked lips.  Dry mouth.  Sunken eyes.  Sleepiness.  Weakness. This information is not intended to replace advice given to you by your health care provider. Make sure you discuss any questions you have with your health care provider. Document Released: 09/05/2002 Document Revised: 01/24/2016 Document Reviewed: 05/22/2015 Elsevier Interactive Patient Education  2017 [employer]  Influenza, Adult Influenza,  more commonly known as "the flu," is a viral infection that primarily affects the respiratory tract. The respiratory tract includes organs that help you breathe, such as the lungs, nose, and throat. The flu causes many common cold symptoms, as well as a high fever and body aches. The flu spreads easily from person to person (is contagious). Getting a flu shot (influenza vaccination) every year is the best way to prevent influenza. What are the causes? Influenza is caused by a  virus. You can catch the virus by:  Breathing in droplets from an infected person's cough or sneeze.  Touching something that was recently contaminated with the virus and then touching your mouth, nose, or eyes. What increases the risk? The following factors may make you more likely to get the flu:  Not cleaning your hands frequently with soap and water or alcohol-based hand sanitizer.  Having close contact with many people during cold and flu season.  Touching your mouth, eyes, or nose without washing or sanitizing your hands first.  Not drinking enough fluids or not eating a healthy diet.  Not getting enough sleep or exercise.  Being under a high amount of stress.  Not getting a yearly (annual) flu shot. You may be at a higher risk of complications from the flu, such as a severe lung infection (pneumonia), if you:  Are over the age of 97.  Are pregnant.  Have a weakened disease-fighting system (immune system). You may have a weakened immune system if you:  Have HIV or AIDS.  Are undergoing chemotherapy.  Aretaking medicines that reduce the activity of (suppress) the immune system.  Have a long-term (chronic) illness, such as heart disease, kidney disease, diabetes, or lung disease.  Have a liver disorder.  Are obese.  Have anemia. What are the signs or symptoms? Symptoms of this condition typically last 4-10 days and may include:  Fever.  Chills.  Headache, body aches, or muscle aches.  Sore throat.  Cough.  Runny or congested nose.  Chest discomfort and cough.  Poor appetite.  Weakness or tiredness (fatigue).  Dizziness.  Nausea or vomiting. How is this diagnosed? This condition may be diagnosed based on your medical history and a physical exam. Your health care provider may do a nose or throat swab test to confirm the diagnosis. How is this treated? If influenza is detected early, you can be treated with antiviral medicine that can reduce the  length of your illness and the severity of your symptoms. This medicine may be given by mouth (orally) or through an IV tube that is inserted in one of your veins. The goal of treatment is to relieve symptoms by taking care of yourself at home. This may include taking over-the-counter medicines, drinking plenty of fluids, and adding humidity to the air in your home. In some cases, influenza goes away on its own. Severe influenza or complications from influenza may be treated in a hospital. Follow these instructions at home:  Take over-the-counter and prescription medicines only as told by your health care provider.  Use a cool mist humidifier to add humidity to the air in your home. This can make breathing easier.  Rest as needed.  Drink enough fluid to keep your urine clear or pale yellow.  Cover your mouth and nose when you cough or sneeze.  Wash your hands with soap and water often, especially after you cough or sneeze. If soap and water are not available, use hand sanitizer.  Stay home from work or  school as told by your health care provider. Unless you are visiting your health care provider, try to avoid leaving home until your fever has been gone for 24 hours without the use of medicine.  Keep all follow-up visits as told by your health care provider. This is important. How is this prevented?  Getting an annual flu shot is the best way to avoid getting the flu. You may get the flu shot in late summer, fall, or winter. Ask your health care provider when you should get your flu shot.  Wash your hands often or use hand sanitizer often.  Avoid contact with people who are sick during cold and flu season.  Eat a healthy diet, drink plenty of fluids, get enough sleep, and exercise regularly. Contact a health care provider if:  You develop new symptoms.  You have:  Chest pain.  Diarrhea.  A fever.  Your cough gets worse.  You produce more mucus.  You feel nauseous or you  vomit. Get help right away if:  You develop shortness of breath or difficulty breathing.  Your skin or nails turn a bluish color.  You have severe pain or stiffness in your neck.  You develop a sudden headache or sudden pain in your face or ear.  You cannot stop vomiting. This information is not intended to replace advice given to you by your health care provider. Make sure you discuss any questions you have with your health care provider. Document Released: 09/12/2000 Document Revised: 02/21/2016 Document Reviewed: 07/10/2015 Elsevier Interactive Patient Education  2017 [employer].    IF you received an x-ray today, you will receive an invoice from [facility]. Please contact [facility] at [phone] with questions or concerns regarding your invoice.   IF you received labwork today, you will receive an invoice from [facility]. Please contact [facility] at [phone] with questions or concerns regarding your invoice.   Our billing staff will not be able to assist you with questions regarding bills from these companies.  You will be contacted with the lab results as soon as they are available. The fastest way to get your results is to activate your My Chart account. Instructions are located on the last page of this paperwork. If you have not heard from [place] regarding the results in 2 weeks, please contact this office.

## 2016-10-13 NOTE — Progress Notes (Addendum)
Subjective:  By signing my name below, I, Erin Casey, attest that this documentation has been prepared under the direction and in the presence of Erin Ray, MD.  Electronically Signed: Thea Alken, ED Scribe. 10/13/2016. 2:00 PM.   Patient ID: Erin Casey, female    DOB: Jul 15, 1981, 36 y.o.   MRN: [id number]  HPI Chief Complaint  Patient presents with  . Cough    x 1 week   . Chest Pain    congestion   . Nasal Congestion    hardly anything comes up     HPI Comments: Erin Casey is a 36 y.o. female who presents to the [facility] complaining of a cough onset 1 week. Pt reports symptoms started with dry cough, nasal congestion, HA, body weakness and feeling as if her ears are clogged 6 days ago. Cough gradually worsened 2-3 days ago with productive cough consisting of green yellow sputum, chest tightness, SOB and  5-6 episodes of diarrhea. Today, she reports some improvement with cough and diarrhea decreasing to 3 episodes. She tried mucinex and delsym without relief to symptoms. She reports some sick contacts at work. She denies recent abx use, hospitalization and recent travel. She denies measured fever  Pt has been prescribed hydrocodone cough syrup in the past. She denies trouble coming off medication or past hx of addiction.    Patient Active Problem List   Diagnosis Date Noted  . Heart palpitations 01/03/2016  . 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 Essential hypertension 04/20/2013  . 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 Depletion of volume of extracellular fluid   . Essential hypertension 04/20/2013  . Neurologic cardiac syncope   . 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 hysterectomy    . PACEMAKER INSERTION     Allergies  Allergen Reactions  . Sulfa Antibiotics Rash and Hives    Nausea  . Amoxicillin-Pot Clavulanate Other (See Comments)    [Derm] [GI]  .  Sulfamethoxazole Rash    vomiting   Prior to Admission medications   Medication Sig Start Date End Date Taking? Authorizing Provider  ketorolac (TORADOL) 10 MG tablet Take 10 mg by mouth daily as needed. Migraine 10/14/15   Historical Provider, MD  methylphenidate 54 MG PO CR tablet Take 1 tablet (54 mg total) by mouth every morning. 08/14/16   Deboraha Sprang, MD  pantoprazole (PROTONIX) 40 MG tablet Take 40 mg by mouth daily.  02/03/16   Historical Provider, MD  prochlorperazine (COMPAZINE) 10 MG tablet Take 10 mg by mouth daily as needed. Migraine 10/14/15   Historical Provider, MD   Social History   Social History  . Marital status: Married    Spouse name: N/A  . Number of children: N/A  . Years of education: N/A   Occupational History  . Not on file.   Social History Main Topics  . Smoking status: Never Smoker  . Smokeless tobacco: Never Used  . Alcohol use No  . Drug use: Unknown  . Sexual activity: Not on file   Other Topics Concern  . Not on file   Social History Narrative  . No narrative on file   Review of Systems  Constitutional: Negative for fever.  HENT: Positive for congestion.   Respiratory: Positive for cough, chest tightness and shortness of breath.   Gastrointestinal: Positive for diarrhea and nausea. Negative for vomiting.  Neurological: Positive for headaches.   Objective:  Physical Exam  Constitutional: She is oriented to person, place, and time. She appears well-developed and well-nourished. No distress.  HENT:  Head: Normocephalic and atraumatic.  Right Ear: Hearing, tympanic membrane, external ear and ear canal normal.  Left Ear: Hearing, tympanic membrane, external ear and ear canal normal.  Nose: Nose normal.  Mouth/Throat: Oropharynx is clear and moist. No oropharyngeal exudate.  Eyes: Conjunctivae and EOM are normal. Pupils are equal, round, and reactive to light.  Cardiovascular: Normal rate, regular rhythm, normal heart sounds and intact  distal pulses.   No murmur heard. Pulmonary/Chest: Effort normal and breath sounds normal. No respiratory distress. She has no wheezes. She has no rhonchi. She has no rales.  Neurological: She is alert and oriented to person, place, and time.  Skin: Skin is warm and dry. No rash noted.  Psychiatric: She has a normal mood and affect. Her behavior is normal.  Vitals reviewed.  Vitals:   10/13/16 1347  BP: 120/82  Pulse: (!) 117  Resp: 18  Temp: 97.9 F (36.6 C)  TempSrc: Oral  SpO2: 97%  Weight: 236 lb (107 kg)  Height: 5\' 10"  (1.778 m)    Assessment & Plan:    Erin Casey is a 36 y.o. female Cough - Plan: HYDROcodone-homatropine (HYCODAN) 5-1.5 MG/5ML syrup, benzonatate (TESSALON) 100 MG capsule  Influenza-like illness  Diarrhea, unspecified type  Suspected viral illness, somewhat improved today. Symptomatic care discussed, RTC precautions if persistent diarrhea. If cough not improving later this week, consider azithromycin to cover for atypicals including pertussis, but unlikely at this time.  -Hycodan for cough if needed, Tessalon Perles. Casey sips of fluids frequently, slow resumption of normal diet. RTC precautions   Meds ordered this encounter  Medications  . HYDROcodone-homatropine (HYCODAN) 5-1.5 MG/5ML syrup    Sig: 52m by mouth a bedtime as needed for cough.    Dispense:  120 mL    Refill:  0  . benzonatate (TESSALON) 100 MG capsule    Sig: Take 1 capsule (100 mg total) by mouth 3 (three) times daily as needed for cough.    Dispense:  20 capsule    Refill:  0   Patient Instructions   Your symptoms are likely due to a virus. Tessalon Perles up to 3 times per day as needed for cough, or hydrocodone up to every 6 hours, but typically use this at bedtime. Casey sips of fluids frequently, and if diarrhea is not improving in the next 2-3 days, may need to look at other testing.  If your cough is not improving by the end of the week, let me know and I can possibly  send in an antibiotic at that time.  Sign up for my chart today for easiest way to email [place] with your updates.  Return to the clinic or go to the nearest emergency room if any of your symptoms worsen or new symptoms occur.   Diarrhea, Adult Diarrhea is frequent loose and watery bowel movements. Diarrhea can make you feel weak and cause you to become dehydrated. Dehydration can make you tired and thirsty, cause you to have a dry mouth, and decrease how often you urinate. Diarrhea typically lasts 2-3 days. However, it can last longer if it is a sign of something more serious. It is important to treat your diarrhea as told by your health care provider. Follow these instructions at home: Eating and drinking Follow these recommendations as told by your health care provider:  Take an oral rehydration solution (ORS).  This is a drink that is sold at pharmacies and retail stores.  Drink clear fluids, such as water, ice chips, diluted fruit juice, and low-calorie sports drinks.  Eat bland, easy-to-digest foods in Casey amounts as you are able. These foods include bananas, applesauce, rice, lean meats, toast, and crackers.  Avoid drinking fluids that contain a lot of sugar or caffeine, such as energy drinks, sports drinks, and soda.  Avoid alcohol.  Avoid spicy or fatty foods. General instructions  Drink enough fluid to keep your urine clear or pale yellow.  Wash your hands often. If soap and water are not available, use hand sanitizer.  Make sure that all people in your household wash their hands well and often.  Take over-the-counter and prescription medicines only as told by your health care provider.  Rest at home while you recover.  Watch your condition for any changes.  Take a warm bath to relieve any burning or pain from frequent diarrhea episodes.  Keep all follow-up visits as told by your health care provider. This is important. Contact a health care provider if:  You have a  fever.  Your diarrhea gets worse.  You have new symptoms.  You cannot keep fluids down.  You feel light-headed or dizzy.  You have a headache  You have muscle cramps. Get help right away if:  You have chest pain.  You feel extremely weak or you faint.  You have bloody or black stools or stools that look like tar.  You have severe pain, cramping, or bloating in your abdomen.  You have trouble breathing or you are breathing very quickly.  Your heart is beating very quickly.  Your skin feels cold and clammy.  You feel confused.  You have signs of dehydration, such as:  Dark urine, very little urine, or no urine.  Cracked lips.  Dry mouth.  Sunken eyes.  Sleepiness.  Weakness. This information is not intended to replace advice given to you by your health care provider. Make sure you discuss any questions you have with your health care provider. Document Released: 09/05/2002 Document Revised: 01/24/2016 Document Reviewed: 05/22/2015 Elsevier Interactive Patient Education  2017 [place].  Influenza, Adult Influenza, more commonly known as "the flu," is a viral infection that primarily affects the respiratory tract. The respiratory tract includes organs that help you breathe, such as the lungs, nose, and throat. The flu causes many common cold symptoms, as well as a high fever and body aches. The flu spreads easily from person to person (is contagious). Getting a flu shot (influenza vaccination) every year is the best way to prevent influenza. What are the causes? Influenza is caused by a virus. You can catch the virus by:  Breathing in droplets from an infected person's cough or sneeze.  Touching something that was recently contaminated with the virus and then touching your mouth, nose, or eyes. What increases the risk? The following factors may make you more likely to get the flu:  Not cleaning your hands frequently with soap and water or alcohol-based hand  sanitizer.  Having close contact with many people during cold and flu season.  Touching your mouth, eyes, or nose without washing or sanitizing your hands first.  Not drinking enough fluids or not eating a healthy diet.  Not getting enough sleep or exercise.  Being under a high amount of stress.  Not getting a yearly (annual) flu shot. You may be at a higher risk of complications from the flu, such as  a severe lung infection (pneumonia), if you:  Are over the age of 35.  Are pregnant.  Have a weakened disease-fighting system (immune system). You may have a weakened immune system if you:  Have HIV or AIDS.  Are undergoing chemotherapy.  Aretaking medicines that reduce the activity of (suppress) the immune system.  Have a long-term (chronic) illness, such as heart disease, kidney disease, diabetes, or lung disease.  Have a liver disorder.  Are obese.  Have anemia. What are the signs or symptoms? Symptoms of this condition typically last 4-10 days and may include:  Fever.  Chills.  Headache, body aches, or muscle aches.  Sore throat.  Cough.  Runny or congested nose.  Chest discomfort and cough.  Poor appetite.  Weakness or tiredness (fatigue).  Dizziness.  Nausea or vomiting. How is this diagnosed? This condition may be diagnosed based on your medical history and a physical exam. Your health care provider may do a nose or throat swab test to confirm the diagnosis. How is this treated? If influenza is detected early, you can be treated with antiviral medicine that can reduce the length of your illness and the severity of your symptoms. This medicine may be given by mouth (orally) or through an IV tube that is inserted in one of your veins. The goal of treatment is to relieve symptoms by taking care of yourself at home. This may include taking over-the-counter medicines, drinking plenty of fluids, and adding humidity to the air in your home. In some cases,  influenza goes away on its own. Severe influenza or complications from influenza may be treated in a hospital. Follow these instructions at home:  Take over-the-counter and prescription medicines only as told by your health care provider.  Use a cool mist humidifier to add humidity to the air in your home. This can make breathing easier.  Rest as needed.  Drink enough fluid to keep your urine clear or pale yellow.  Cover your mouth and nose when you cough or sneeze.  Wash your hands with soap and water often, especially after you cough or sneeze. If soap and water are not available, use hand sanitizer.  Stay home from work or school as told by your health care provider. Unless you are visiting your health care provider, try to avoid leaving home until your fever has been gone for 24 hours without the use of medicine.  Keep all follow-up visits as told by your health care provider. This is important. How is this prevented?  Getting an annual flu shot is the best way to avoid getting the flu. You may get the flu shot in late summer, fall, or winter. Ask your health care provider when you should get your flu shot.  Wash your hands often or use hand sanitizer often.  Avoid contact with people who are sick during cold and flu season.  Eat a healthy diet, drink plenty of fluids, get enough sleep, and exercise regularly. Contact a health care provider if:  You develop new symptoms.  You have:  Chest pain.  Diarrhea.  A fever.  Your cough gets worse.  You produce more mucus.  You feel nauseous or you vomit. Get help right away if:  You develop shortness of breath or difficulty breathing.  Your skin or nails turn a bluish color.  You have severe pain or stiffness in your neck.  You develop a sudden headache or sudden pain in your face or ear.  You cannot stop vomiting. This  information is not intended to replace advice given to you by your health care provider. Make sure  you discuss any questions you have with your health care provider. Document Released: 09/12/2000 Document Revised: 02/21/2016 Document Reviewed: 07/10/2015 Elsevier Interactive Patient Education  2017 [employer].    IF you received an x-Casey today, you will receive an invoice from [facility]. Please contact [facility] at [phone] with questions or concerns regarding your invoice.   IF you received labwork today, you will receive an invoice from [facility]. Please contact [facility] at [phone] with questions or concerns regarding your invoice.   Our billing staff will not be able to assist you with questions regarding bills from these companies.  You will be contacted with the lab results as soon as they are available. The fastest way to get your results is to activate your My Chart account. Instructions are located on the last page of this paperwork. If you have not heard from [place] regarding the results in 2 weeks, please contact this office.      I personally performed the services described in this documentation, which was scribed in my presence. The recorded information has been reviewed and considered, and addended by me as needed.   Signed,   Erin Ray, MD [facility] [place].  10/13/16 3:35 PM

## 2016-10-17 ENCOUNTER — Telehealth: Payer: Self-pay

## 2016-10-17 MED ORDER — AZITHROMYCIN 250 MG PO TABS: ORAL_TABLET | ORAL | 0 refills | Status: DC

## 2016-10-17 NOTE — Telephone Encounter (Signed)
Z-Pak sent to pharmacy. Please advise her and return to clinic if worsening.

## 2016-10-17 NOTE — Telephone Encounter (Signed)
Pt was told that if she was not any better by Friday to call back and ask for a antobodic   Best number [phone]

## 2016-10-17 NOTE — Telephone Encounter (Signed)
See  Ov note and advised

## 2016-10-20 ENCOUNTER — Telehealth: Payer: Self-pay | Admitting: Family Medicine

## 2016-10-20 NOTE — Telephone Encounter (Signed)
Pt called stating that her lt ear has been throbbing since Friday nad the pain is getting worst plus she started the antibiotic that day please give pt a call back

## 2016-10-22 NOTE — Telephone Encounter (Signed)
Azithromycin is only taken for 5 days, but is a very long acting medication so is in the blood stream at a treatment level for at least 10 days. It is similar to taking a different antibiotic for a full 10 days. She is still under treatment with azithromycin, so would not add any other medications at this time. If fevers or other worsening symptoms, can return to clinic for recheck.

## 2016-10-22 NOTE — Telephone Encounter (Signed)
Spoke with patient today she states she doesn't feel like she has the flu anymore, but the pressure in her sinus is still there.  States she is not blowing anything out her nose.  Has no cough.  She just finished her last zpack pill.    Wanted another prescription called in Please advise

## 2016-10-23 NOTE — Telephone Encounter (Signed)
Pt advised.

## 2016-11-19 ENCOUNTER — Telehealth: Payer: Self-pay | Admitting: *Deleted

## 2016-11-19 NOTE — Telephone Encounter (Signed)
Patient requesting a prescription be written for concerta. Patient made aware that Dr. Caryl Comes will be in the office tomorrow and message will be routed to his RN. Patient verbalized understanding.

## 2016-11-19 NOTE — Telephone Encounter (Signed)
Patient called and requested a refill on concerta. She stated that she needs to pick this up today as she is out of the medication. Please call patient at [phone] when the rx is ready. Thanks, MI

## 2016-11-20 NOTE — Telephone Encounter (Signed)
The patient is aware that her RX's for her Concerta CR 54 mg- take one tablet by mouth once daily for the next 3 months are ready and will be at the front desk for pick up.

## 2017-02-20 ENCOUNTER — Encounter: Payer: Self-pay | Admitting: Internal Medicine

## 2017-02-20 ENCOUNTER — Ambulatory Visit (INDEPENDENT_AMBULATORY_CARE_PROVIDER_SITE_OTHER): Payer: 59 | Admitting: Internal Medicine

## 2017-02-20 ENCOUNTER — Encounter (INDEPENDENT_AMBULATORY_CARE_PROVIDER_SITE_OTHER): Payer: Self-pay

## 2017-02-20 VITALS — BP 127/86 | HR 81 | Ht 70.0 in | Wt 244.0 lb

## 2017-02-20 DIAGNOSIS — Z950 Presence of cardiac pacemaker: Secondary | ICD-10-CM | POA: Diagnosis not present

## 2017-02-20 DIAGNOSIS — R55 Syncope and collapse: Secondary | ICD-10-CM | POA: Diagnosis not present

## 2017-02-20 DIAGNOSIS — I472 Ventricular tachycardia: Secondary | ICD-10-CM

## 2017-02-20 DIAGNOSIS — I4729 Other ventricular tachycardia: Secondary | ICD-10-CM

## 2017-02-20 DIAGNOSIS — I471 Supraventricular tachycardia, unspecified: Secondary | ICD-10-CM

## 2017-02-20 LAB — CUP PACEART INCLINIC DEVICE CHECK
Implantable Lead Implant Date: 20130708
Implantable Lead Location: 753859
Implantable Lead Model: 350
Implantable Pulse Generator Implant Date: 20130708
MDC IDC LEAD IMPLANT DT: 20130708
MDC IDC LEAD LOCATION: 753860
MDC IDC LEAD SERIAL: 29222139
MDC IDC LEAD SERIAL: 29231638
MDC IDC SESS DTM: 20180525133124
Pulse Gen Serial Number: 66303845

## 2017-02-20 NOTE — Progress Notes (Signed)
      Patient Care Team: Reita Cliche, MD as PCP - General (Internal Medicine)   HPI  Erin Casey is a 36 y.o. female Seen in follow-up for a Biotronik CLS pacemaker([facility]) implanted for symptomatic neurocardiogenic syncope.  She's had no interval dizziness. She's had no interval syncope.  Most recently she has had a day of episodic chest pain that she describes as heaviness with radiation to the left chest. Spells lasted 5-15 minutes. They've not been associated with exercise.  Exercises been going pretty well. Apart from running and elliptical, she is able to do what she wants with her trainer.   Echo 4/17 was normal  Records and Results Reviewed from [facility]  Past Medical History:  Diagnosis Date  . Depletion of volume of extracellular fluid   . Essential hypertension 04/20/2013  . GERD (gastroesophageal reflux disease)   . Neurologic cardiac syncope   . 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 ABDOMINAL HYSTERECTOMY    . CESAREAN SECTION    . CHOLECYSTECTOMY    . hysterectomy    . PACEMAKER INSERTION    . TUBAL LIGATION      Current Outpatient Prescriptions  Medication Sig Dispense Refill  . amoxicillin (AMOXIL) 500 MG tablet Take 500 mg by mouth every 6 (six) hours.    Marland Kitchen ketorolac (TORADOL) 10 MG tablet Take 10 mg by mouth daily as needed. Migraine    . methylphenidate 54 MG PO CR tablet Take 1 tablet (54 mg total) by mouth every morning.    . pantoprazole (PROTONIX) 40 MG tablet Take 40 mg by mouth daily.     . prochlorperazine (COMPAZINE) 10 MG tablet Take 10 mg by mouth daily as needed. Migraine     No current facility-administered medications for this visit.     Allergies  Allergen Reactions  . Sulfa Antibiotics Rash and Hives    Nausea  . Amoxicillin-Pot Clavulanate Other (See Comments)    [Derm] [GI]  . Sulfamethoxazole Rash    vomiting      Review of Systems negative except from HPI and  PMH  Physical Exam BP 127/86   Pulse 81   Ht 5\' 10"  (1.778 m)   Wt 244 lb (110.7 kg)   SpO2 98%   BMI 35.01 kg/m  Well developed and nourished in no acute distress HENT normal Neck supple with JVP-flat Device pocket well healed; without hematoma or erythema.  There is no tethering  Clear Regular rate and rhythm, no murmurs or gallops Abd-soft with active BS without hepatomegaly No Clubbing cyanosis edema Skin-warm and dry A & Oriented  Grossly normal sensory and motor function   ECG demonstrates atrial pacing at 99 intervals 16/09/36 Otherwise normal  Assessment and  Plan  SVT  VT- NS  Pacemaker -Biotronik/CLS The patient's device was interrogated.  The information was reviewed. No changes were made in the programming except to change the mode switch rate  Neurocardiogenic syncope/dysautonomia  Chest pain   Brief episodes of interval tachycardia. Mechanism is not clear. Not to discombobulated. We'll hold off on medications.  No interval ventricular tachycardia  No interval syncope. Dysautonomic symptoms are relatively quiescent.  Chest pain syndrome is unlikely cardiac given the patient's age and degree of fitness. It is likely related to dysautonomia. She will let us know if she has more symptoms particularly she gets back to exercise. In the event that this is true, we will undertake standard treadmill testing

## 2017-02-20 NOTE — Patient Instructions (Signed)

## 2017-05-04 ENCOUNTER — Telehealth: Payer: Self-pay | Admitting: Internal Medicine

## 2017-05-04 NOTE — Telephone Encounter (Signed)
Erin Casey is calling because the quanity was left off the prescription for Concerta and the pharmacy needs to know how many pills is  Needed . Please call the pharmacy ( [facility] on [address] [phone] . She has been out of medication since Friday . Thanks

## 2017-05-12 ENCOUNTER — Telehealth: Payer: Self-pay | Admitting: Internal Medicine

## 2017-05-12 NOTE — Telephone Encounter (Signed)
I spoke with the patient.  She states that over the last 2 weeks she is up 12-15 lbs. She denies swelling to her lower extremities, feels like she is carrying this in her belly.  Today she woke up with swelling in her face/ eyes/ lips. She denies any medication/ dietary changes.  She is able to breath/ swallow without difficulty, although when she exercises, she does feel somewhat SOB.  BP this morning was 148/110. She feels like she has had decreased urine output since late last week/ through the weekend, but her urine is not concentrated.  I advised I will review with Dr. Caryl Comes and call her back.  She is agreeable.

## 2017-05-12 NOTE — Telephone Encounter (Signed)
New message    Pt c/o swelling: STAT is pt has developed SOB within 24 hours  1. How long have you been experiencing swelling? A week  2. Where is the swelling located? Face, stomach  3.  Are you currently taking a "fluid pill"? No-pt states that she can't  4.  Are you currently SOB? No   5.  Have you traveled recently? Went to [place] a month ago.

## 2017-05-12 NOTE — Telephone Encounter (Signed)
I spoke with Dr. Caryl Comes regarding the patient's symptoms. He advised that the patient's reported symptoms do not seen cardiac in nature- especially with swelling of the facial area. He advises that the patient follow up with her PCP for further evaluation and testing. I have notified the patient and she voices understanding.

## 2017-05-25 ENCOUNTER — Ambulatory Visit (INDEPENDENT_AMBULATORY_CARE_PROVIDER_SITE_OTHER): Payer: 59 | Admitting: *Deleted

## 2017-05-25 DIAGNOSIS — R55 Syncope and collapse: Secondary | ICD-10-CM

## 2017-05-25 NOTE — Progress Notes (Signed)
Remote pacemaker transmission.   

## 2017-06-02 ENCOUNTER — Telehealth: Payer: Self-pay | Admitting: Internal Medicine

## 2017-06-02 NOTE — Telephone Encounter (Signed)
°*  STAT* If patient is at the pharmacy, call can be transferred to refill team.   1. Which medications need to be refilled? (please list name of each medication and dose if known) Concerta-needs a written prescription for this-please call when it is ready  2. Which pharmacy/location (including street and city if local pharmacy) is medication to be sent to? 3. Do they need a 30 day or 90 day supply?  She needs 3 different prescriptions for #30 please

## 2017-06-04 NOTE — Telephone Encounter (Signed)
Late entry- 3 separate prescriptions for Concerta CR 54 mg once daily mailed to the patient on 06/02/17.

## 2017-06-05 ENCOUNTER — Encounter: Payer: Self-pay | Admitting: Cardiology

## 2017-06-17 LAB — CUP PACEART REMOTE DEVICE CHECK
Implantable Lead Implant Date: 20130708
Implantable Lead Location: 753859
Implantable Lead Model: 350
Implantable Lead Serial Number: 29222139
Implantable Lead Serial Number: 29231638
Implantable Pulse Generator Implant Date: 20130708
MDC IDC LEAD IMPLANT DT: 20130708
MDC IDC LEAD LOCATION: 753860
MDC IDC PG SERIAL: 66303845
MDC IDC SESS DTM: 20180919082636

## 2017-08-17 ENCOUNTER — Telehealth: Payer: Self-pay | Admitting: Internal Medicine

## 2017-08-17 NOTE — Telephone Encounter (Signed)
Spoke with pt informed her that a transmission had been received over the night and there were no high heart rates, pt would like device checked in clinic b/c last time " there was some VT that didn't transmit and no one knew about it until the device was checked in office". Pt agreeable to apt on 08/18/2017 at 9:00am. Industry aware of apt.

## 2017-08-17 NOTE — Telephone Encounter (Signed)
Erin Casey is calling because she went through a [employer] on Friday 08/14/17 and has been having weird heart rates . On Friday she was having a lot of PVC's and last night it felt like her heart was shaking. She wants to come in and have her pacemaker checked  to see if her Pacemaker is still programmed correctly.

## 2017-08-24 ENCOUNTER — Ambulatory Visit: Payer: 59 | Admitting: *Deleted

## 2017-08-25 NOTE — Progress Notes (Signed)
Remote pacemaker transmission.   

## 2017-08-28 ENCOUNTER — Encounter: Payer: Self-pay | Admitting: Cardiology

## 2017-09-01 DIAGNOSIS — I493 Ventricular premature depolarization: Secondary | ICD-10-CM | POA: Insufficient documentation

## 2017-12-01 DIAGNOSIS — E778 Other disorders of glycoprotein metabolism: Secondary | ICD-10-CM | POA: Insufficient documentation

## 2017-12-01 DIAGNOSIS — K219 Gastro-esophageal reflux disease without esophagitis: Secondary | ICD-10-CM | POA: Insufficient documentation

## 2018-01-25 ENCOUNTER — Encounter: Payer: Self-pay | Admitting: Internal Medicine

## 2018-01-25 ENCOUNTER — Ambulatory Visit: Payer: 59 | Admitting: Internal Medicine

## 2018-01-25 VITALS — BP 120/70 | HR 77 | Ht 70.0 in | Wt 258.0 lb

## 2018-01-25 DIAGNOSIS — Z950 Presence of cardiac pacemaker: Secondary | ICD-10-CM

## 2018-01-25 DIAGNOSIS — I471 Supraventricular tachycardia: Secondary | ICD-10-CM | POA: Diagnosis not present

## 2018-01-25 DIAGNOSIS — R55 Syncope and collapse: Secondary | ICD-10-CM | POA: Diagnosis not present

## 2018-01-25 DIAGNOSIS — I472 Ventricular tachycardia: Secondary | ICD-10-CM | POA: Diagnosis not present

## 2018-01-25 DIAGNOSIS — I4729 Other ventricular tachycardia: Secondary | ICD-10-CM

## 2018-01-25 MED ORDER — DRONEDARONE HCL 400 MG PO TABS: 400.0000 mg | ORAL_TABLET | Freq: Two times a day (BID) | ORAL | 6 refills | Status: DC

## 2018-01-25 NOTE — Progress Notes (Signed)
Patient Care Team: Reita Cliche, MD as PCP - General (Internal Medicine)   HPI  Erin Casey is a 37 y.o. female Seen in follow-up for a Biotronik CLS pacemaker(WF B with [facility]) implanted for symptomatic neurocardiogenic syncope.  With this, she has significant improvement in her overall symptoms.  There is no interval dizziness or syncope.  Last fall she developed lightheadedness and palpitations.  She was seen at [facility] by Dr. Rosita Fire who noted PVCs.  There are a couple of arrhythmic sequelae which she tried to address by decreasing the PVC triggers.  She has not tolerated the flecainide well.  She underwent Holter monitoring which demonstrated "multifocal PVCs "however, on reading the monitor today all the triggering beats remain unifocal.  These arrhythmic sequela I included 2 different arrhythmias-the first appeared to be retrograde block into the AV node associated with AV pacing this would fatigue and conduction resumed.  The other was a slow wide-complex rhythm without discernible P waves that I thought was slow ventricular tachycardia; Dr. Rosita Fire apparently saw the same thing.  She has developed a left upper extremity DVT related to infusion of incompatible drugs apparently.  She is on Xarelto and tolerating it well.  It is been about 2 months.  Echo 4/17 was normal     Past Medical History:  Diagnosis Date  . Depletion of volume of extracellular fluid   . Essential hypertension 04/20/2013  . GERD (gastroesophageal reflux disease)   . Neurologic cardiac syncope   . 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 ABDOMINAL HYSTERECTOMY    . CESAREAN SECTION    . CHOLECYSTECTOMY    . hysterectomy    . PACEMAKER INSERTION    . TUBAL LIGATION      Current Outpatient Medications  Medication Sig Dispense Refill  . methylphenidate 54 MG PO CR tablet Take 1 tablet (54 mg total) by mouth every morning.    . pantoprazole  (PROTONIX) 40 MG tablet Take 40 mg by mouth daily.     . rivaroxaban (XARELTO) 20 MG TABS tablet Take 1 tablet by mouth daily.    Marland Kitchen dronedarone (MULTAQ) 400 MG tablet Take 1 tablet (400 mg total) by mouth 2 (two) times daily with a meal. 60 tablet 6   No current facility-administered medications for this visit.     Allergies  Allergen Reactions  . Sulfa Antibiotics Rash and Hives    Nausea  . Amoxicillin-Pot Clavulanate Other (See Comments)    [Derm] [GI]  . Sulfamethoxazole Rash    vomiting      Review of Systems negative except from HPI and PMH  Physical Exam BP 120/70   Pulse 77   Ht 5\' 10"  (1.778 m)   Wt 258 lb (117 kg)   SpO2 98%   BMI 37.02 kg/m  Well developed and nourished in no acute distress HENT normal Neck supple with JVP-flat Clear Regular rate and rhythm, no murmurs or gallops Abd-soft with active BS No Clubbing cyanosis edema Skin-warm and dry A & Oriented  Grossly normal sensory and motor function   Assessment and  Plan  SVT  VT- NS  Pacemaker -Biotronik/CLS The patient's device was interrogated and the information was fully reviewed.  The device was reprogrammed to make more aggressive CLS  Neurocardiogenic syncope/dysautonomia  Chest pain  Left upper extremity DVT  Recurrent interval syncope since her device was reprogrammed.  We have reprogrammed the more aggressive CLS algorithm.  Interval ventricular tachycardia-nonsustained as well as AV pacing consequential to the PVCs.  She has not tolerated the flecainide.  We will plan to stop it.  We have started her on dronaderone.  She would however, rather than seeking drugs, undergoing ablative procedure.  I will arrange consultation with Dr. Elliot Cousin for consideration of ablation   On Anticoagulation;  No bleeding issues   Normally for upper extremity DVT with use anticoagulation for 3 months.  More than 50% of 40 min was spent in counseling related to the above

## 2018-01-25 NOTE — Patient Instructions (Addendum)
Medication Instructions: Your physician has recommended you make the following change in your medication:  -1) STOP Flecainide -2) START MULTAQ 400 mg - Take 1 tablet by mouth twice daily - RX SENT  Labwork: None Ordered  Procedures/Testing: None Ordered  Follow-Up: You have been referred to Dr. Cristopher Peru for a consultation for a PVC ablation   If you need a refill on your cardiac medications before your next appointment, please call your pharmacy.

## 2018-01-26 ENCOUNTER — Telehealth: Payer: Self-pay | Admitting: Internal Medicine

## 2018-01-26 LAB — CUP PACEART INCLINIC DEVICE CHECK
Date Time Interrogation Session: 20190430140448
Implantable Lead Implant Date: 20130708
Implantable Lead Location: 753859
Implantable Lead Location: 753860
Implantable Lead Serial Number: 29222139
Implantable Lead Serial Number: 29231638
Implantable Pulse Generator Implant Date: 20130708
Lead Channel Impedance Value: 546 Ohm
Lead Channel Pacing Threshold Amplitude: 0.6 V
Lead Channel Pacing Threshold Pulse Width: 0.4 ms
Lead Channel Sensing Intrinsic Amplitude: 14.7 mV
Lead Channel Sensing Intrinsic Amplitude: 3.6 mV
Lead Channel Setting Pacing Pulse Width: 0.4 ms
MDC IDC LEAD IMPLANT DT: 20130708
MDC IDC MSMT LEADCHNL RA IMPEDANCE VALUE: 546 Ohm
MDC IDC MSMT LEADCHNL RV PACING THRESHOLD AMPLITUDE: 1.1 V
MDC IDC MSMT LEADCHNL RV PACING THRESHOLD PULSEWIDTH: 0.4 ms
MDC IDC SET LEADCHNL RA PACING AMPLITUDE: 2 V
MDC IDC SET LEADCHNL RV PACING AMPLITUDE: 2.4 V
MDC IDC STAT BRADY RA PERCENT PACED: 4 %
MDC IDC STAT BRADY RV PERCENT PACED: 0 %
Pulse Gen Serial Number: 66303845

## 2018-01-26 NOTE — Telephone Encounter (Signed)
New message  Pt verbalzied that she is calling for RN  She need a work excuse letter for 01/25/2018  To [employer], please fax to attention to : To whom it may concern: [phone]

## 2018-01-26 NOTE — Telephone Encounter (Signed)
Spoke with pt stated that she felt great after changes made yesterday to her device but that today about lunch time she started feeling like she was having PVCs that was causing VP, pt is still enrolled in [facility] home monitoring so was unable to review remote transmission offered pt an apt with device clinic on Thursday to ascertain if she needed to be changed back to the other pacemaker setting with SK in office, pt stated that she would like to wait and see if things calm down before changes are made.

## 2018-01-26 NOTE — Telephone Encounter (Signed)
New Message   Patient states that when she is moving around or sitting she has symptoms of Vtach. Please call to discuss.

## 2018-02-17 ENCOUNTER — Telehealth: Payer: Self-pay | Admitting: Internal Medicine

## 2018-02-17 MED ORDER — METHYLPHENIDATE HCL ER (OSM) 54 MG PO TBCR: 54.0000 mg | EXTENDED_RELEASE_TABLET | ORAL | 0 refills | Status: DC

## 2018-02-17 NOTE — Telephone Encounter (Signed)
Pt pharmacy is requesting a refill on Methylphenidate. Would Dr. Caryl Comes like to refill this medication? Please advise

## 2018-02-17 NOTE — Telephone Encounter (Signed)
Pt will be in the office tomorrow to see GT. Script refills have been placed at the front for pt to pick up while in the office on 5/23.

## 2018-02-18 ENCOUNTER — Encounter

## 2018-02-18 ENCOUNTER — Encounter: Payer: Self-pay | Admitting: Internal Medicine

## 2018-02-18 ENCOUNTER — Ambulatory Visit: Payer: 59 | Admitting: Internal Medicine

## 2018-02-18 VITALS — BP 124/80 | HR 83 | Ht 70.5 in | Wt 261.0 lb

## 2018-02-18 DIAGNOSIS — R55 Syncope and collapse: Secondary | ICD-10-CM | POA: Diagnosis not present

## 2018-02-18 DIAGNOSIS — I471 Supraventricular tachycardia: Secondary | ICD-10-CM

## 2018-02-18 DIAGNOSIS — I493 Ventricular premature depolarization: Secondary | ICD-10-CM

## 2018-02-18 NOTE — Progress Notes (Signed)
HPI [name] is referred today by Dr. Caryl Comes to consider catheter ablation of her symptomatic PVC's. She is a pleasant 37 yo woman who has neurally mediated syncope, s/p PPM insertion several years ago. She has had recurrent palpitations due to what appears to be a mostly monomorphic PVC's. She has been treated with AA meds with minimal improvement. Review of her heart monitor/ppm  suggests that she has had about 2% of her beats as PVC's. She has been fairly intolerant of her AA drugs including flecainide and multaq. Her arrhythmia counters include her use of these drugs. No additional syncope.  Allergies  Allergen Reactions  . Sulfa Antibiotics Rash and Hives    Nausea  . Amoxicillin-Pot Clavulanate Other (See Comments)    [Derm] [GI]  . Sulfamethoxazole Rash    vomiting     Current Outpatient Medications  Medication Sig Dispense Refill  . dronedarone (MULTAQ) 400 MG tablet Take 1 tablet (400 mg total) by mouth 2 (two) times daily with a meal. 60 tablet 6  . methylphenidate 54 MG PO CR tablet Take 1 tablet (54 mg total) by mouth every morning. 90 tablet 0  . pantoprazole (PROTONIX) 40 MG tablet Take 40 mg by mouth daily.      No current facility-administered medications for this visit.      Past Medical History:  Diagnosis Date  . Depletion of volume of extracellular fluid   . Essential hypertension 04/20/2013  . GERD (gastroesophageal reflux disease)   . Neurologic cardiac syncope   . 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   Past Surgical History:  Procedure Laterality Date  . ABDOMINAL HYSTERECTOMY    . CESAREAN SECTION    . CHOLECYSTECTOMY    . hysterectomy    . PACEMAKER INSERTION    . TUBAL LIGATION       Family History  Problem Relation Age of Onset  . Diabetes Mother   . Hyperlipidemia Mother   . Hypertension Father   . Heart disease Father   . Hyperlipidemia Father   . Mental  illness Sister   . Cancer Maternal Grandmother   . Hyperlipidemia Maternal Grandmother   . Heart disease Maternal Grandfather   . Hyperlipidemia Maternal Grandfather   . Hyperlipidemia Paternal Grandmother   . Hypertension Paternal Grandmother   . Heart disease Paternal Grandfather   . Hyperlipidemia Paternal Grandfather      Social History   Socioeconomic History  . Marital status: Married    Spouse name: Not on file  . Number of children: Not on file  . Years of education: Not on file  . Highest education level: Not on file  Occupational History  . Not on file  Social Needs  . Financial resource strain: Not on file  . Food insecurity:    Worry: Not on file    Inability: Not on file  . Transportation needs:    Medical: Not on file    Non-medical: Not on file  Tobacco Use  . Smoking status: Never Smoker  . Smokeless tobacco: Never Used  Substance and Sexual Activity  . Alcohol use: No  . Drug use: Not on file  . Sexual activity: Not on file  Lifestyle  . Physical activity:    Days per week: Not on file    Minutes per session: Not on file  . Stress: Not on file  Relationships  . Social connections:  Talks on phone: Not on file    Gets together: Not on file    Attends religious service: Not on file    Active member of club or organization: Not on file    Attends meetings of clubs or organizations: Not on file    Relationship status: Not on file  . Intimate partner violence:    Fear of current or ex partner: Not on file    Emotionally abused: Not on file    Physically abused: Not on file    Forced sexual activity: Not on file  Other Topics Concern  . Not on file  Social History Narrative  . Not on file     BP 124/80   Pulse 83   Ht 5' 10.5" (1.791 m)   Wt 261 lb (118.4 kg)   SpO2 97%   BMI 36.92 kg/m   Physical Exam:  Well appearing 37 yo woman, NAD HEENT: Unremarkable Neck:  6 cm JVD, no thyromegally Lymphatics:  No adenopathy Back:  No CVA  tenderness Lungs:  Clear with no wheezes HEART:  Regular rate rhythm, no murmurs, no rubs, no clicks Abd:  soft, positive bowel sounds, no organomegally, no rebound, no guarding Ext:  2 plus pulses, no edema, no cyanosis, no clubbing Skin:  No rashes no nodules Neuro:  CN II through XII intact, motor grossly intact  EKG - NSR with frequent PVC's.   DEVICE  Normal device function.  See PaceArt for details. Her biotronik PPM is working normally.  Assess/Plan: 1. PVC's - I have discussed the treatment options. I have some concern that despite her symptoms, the density of her PVC's is going to make ablation more difficulty especially with any sedation. Also, the relative narrowness of the QRS duration of her PVC's suggests that they are originating near her His purkinje system. I warned her that successful ablation could result in her using her PPM more often.  2. Autonomic dysfunction - she appears to be tolerating her current meds.  I spent over 25 minutes including 50% face to face time producing this note. Mikle Bosworth.D.

## 2018-02-18 NOTE — Patient Instructions (Addendum)
Medication Instructions:  Your physician recommends that you continue on your current medications as directed. Please refer to the Current Medication list given to you today.  Labwork: You will get lab work on February 28, 2018 at the [facility] office.  You can come in any time between 8:00 am and 5:00 pm.  You do not need to be fasting.  Testing/Procedures: Your physician has recommended that you have an ablation. Catheter ablation is a medical procedure used to treat some cardiac arrhythmias (irregular heartbeats). During catheter ablation, a long, thin, flexible tube is put into a blood vessel in your groin (upper thigh), or neck. This tube is called an ablation catheter. It is then guided to your heart through the blood vessel. Radio frequency waves destroy small areas of heart tissue where abnormal heartbeats may cause an arrhythmia to start. Please see the instruction sheet given to you today.  Follow-Up:  You will follow up with Dr. Lovena Le 4 weeks after your procedure.   Any Other Special Instructions Will Be Listed Below (If Applicable).  Ablation instructions:  Please arrive at the [facility] main entrance of [place] hospital at:  5:30 am on April 15, 2018 Do not eat or drink after midnight prior to procedure Hold your Multaq for 3 days prior to your procedure-last dose will be on April 11, 2018 Do not take any  Morning medicines Plan for one night stay-but you may go home the same day You will need someone to drive you home at discharge   If you need a refill on your cardiac medications before your next appointment, please call your pharmacy.    Cardiac Ablation Cardiac ablation is a procedure to disable (ablate) a small amount of heart tissue in very specific places. The heart has many electrical connections. Sometimes these connections are abnormal and can cause the heart to beat very fast or irregularly. Ablating some of the problem areas can improve the heart rhythm or return  it to normal. Ablation may be done for people who:  Have Wolff-Parkinson-White syndrome.  Have fast heart rhythms (tachycardia).  Have taken medicines for an abnormal heart rhythm (arrhythmia) that were not effective or caused side effects.  Have a high-risk heartbeat that may be life-threatening.  During the procedure, a small incision is made in the neck or the groin, and a long, thin, flexible tube (catheter) is inserted into the incision and moved to the heart. Small devices (electrodes) on the tip of the catheter will send out electrical currents. A type of X-ray (fluoroscopy) will be used to help guide the catheter and to provide images of the heart. Tell a health care provider about:  Any allergies you have.  All medicines you are taking, including vitamins, herbs, eye drops, creams, and over-the-counter medicines.  Any problems you or family members have had with anesthetic medicines.  Any blood disorders you have.  Any surgeries you have had.  Any medical conditions you have, such as kidney failure.  Whether you are pregnant or may be pregnant. What are the risks? Generally, this is a safe procedure. However, problems may occur, including:  Infection.  Bruising and bleeding at the catheter insertion site.  Bleeding into the chest, especially into the sac that surrounds the heart. This is a serious complication.  Stroke or blood clots.  Damage to other structures or organs.  Allergic reaction to medicines or dyes.  Need for a permanent pacemaker if the normal electrical system is damaged. A pacemaker is  a small computer that sends electrical signals to the heart and helps your heart beat normally.  The procedure not being fully effective. This may not be recognized until months later. Repeat ablation procedures are sometimes required.  What happens before the procedure?  Follow instructions from your health care provider about eating or drinking  restrictions.  Ask your health care provider about: ? Changing or stopping your regular medicines. This is especially important if you are taking diabetes medicines or blood thinners. ? Taking medicines such as aspirin and ibuprofen. These medicines can thin your blood. Do not take these medicines before your procedure if your health care provider instructs you not to.  Plan to have someone take you home from the hospital or clinic.  If you will be going home right after the procedure, plan to have someone with you for 24 hours. What happens during the procedure?  To lower your risk of infection: ? Your health care team will wash or sanitize their hands. ? Your skin will be washed with soap. ? Hair may be removed from the incision area.  An IV tube will be inserted into one of your veins.  You will be given a medicine to help you relax (sedative).  The skin on your neck or groin will be numbed.  An incision will be made in your neck or your groin.  A needle will be inserted through the incision and into a large vein in your neck or groin.  A catheter will be inserted into the needle and moved to your heart.  Dye may be injected through the catheter to help your surgeon see the area of the heart that needs treatment.  Electrical currents will be sent from the catheter to ablate heart tissue in desired areas. There are three types of energy that may be used to ablate heart tissue: ? Heat (radiofrequency energy). ? Laser energy. ? Extreme cold (cryoablation).  When the necessary tissue has been ablated, the catheter will be removed.  Pressure will be held on the catheter insertion area to prevent excessive bleeding.  A bandage (dressing) will be placed over the catheter insertion area. The procedure may vary among health care providers and hospitals. What happens after the procedure?  Your blood pressure, heart rate, breathing rate, and blood oxygen level will be monitored  until the medicines you were given have worn off.  Your catheter insertion area will be monitored for bleeding. You will need to lie still for a few hours to ensure that you do not bleed from the catheter insertion area.  Do not drive for 24 hours or as long as directed by your health care provider. Summary  Cardiac ablation is a procedure to disable (ablate) a small amount of heart tissue in very specific places. Ablating some of the problem areas can improve the heart rhythm or return it to normal.  During the procedure, electrical currents will be sent from the catheter to ablate heart tissue in desired areas. This information is not intended to replace advice given to you by your health care provider. Make sure you discuss any questions you have with your health care provider. Document Released: 02/01/2009 Document Revised: 08/04/2016 Document Reviewed: 08/04/2016 Elsevier Interactive Patient Education  [employer].

## 2018-02-26 ENCOUNTER — Encounter: Payer: Self-pay | Admitting: Internal Medicine

## 2018-03-04 ENCOUNTER — Telehealth: Payer: Self-pay

## 2018-03-04 NOTE — Telephone Encounter (Signed)
Call placed to Pt.  Notified Pt we could move her ablation to June 14 if she would like. Pt would like to reschedule. Rescheduled procedure and labs. Pt indicates understanding.

## 2018-03-08 ENCOUNTER — Other Ambulatory Visit: Payer: 59 | Admitting: *Deleted

## 2018-03-08 DIAGNOSIS — I493 Ventricular premature depolarization: Secondary | ICD-10-CM

## 2018-03-08 LAB — CBC WITH DIFFERENTIAL/PLATELET
BASOS ABS: 0 10*3/uL (ref 0.0–0.2)
Basos: 1 %
EOS (ABSOLUTE): 0.2 10*3/uL (ref 0.0–0.4)
Eos: 4 %
Hematocrit: 43.1 % (ref 34.0–46.6)
Hemoglobin: 14.8 g/dL (ref 11.1–15.9)
Immature Grans (Abs): 0 10*3/uL (ref 0.0–0.1)
Immature Granulocytes: 0 %
LYMPHS ABS: 2.5 10*3/uL (ref 0.7–3.1)
LYMPHS: 46 %
MCH: 31.6 pg (ref 26.6–33.0)
MCHC: 34.3 g/dL (ref 31.5–35.7)
MCV: 92 fL (ref 79–97)
MONOCYTES: 8 %
Monocytes Absolute: 0.5 10*3/uL (ref 0.1–0.9)
NEUTROS ABS: 2.2 10*3/uL (ref 1.4–7.0)
Neutrophils: 41 %
PLATELETS: 206 10*3/uL (ref 150–450)
RBC: 4.69 x10E6/uL (ref 3.77–5.28)
RDW: 13.2 % (ref 12.3–15.4)
WBC: 5.5 10*3/uL (ref 3.4–10.8)

## 2018-03-08 LAB — BASIC METABOLIC PANEL
BUN / CREAT RATIO: 13 (ref 9–23)
BUN: 12 mg/dL (ref 6–20)
CALCIUM: 8.8 mg/dL (ref 8.7–10.2)
CHLORIDE: 105 mmol/L (ref 96–106)
CO2: 23 mmol/L (ref 20–29)
Creatinine, Ser: 0.89 mg/dL (ref 0.57–1.00)
GFR calc non Af Amer: 83 mL/min/{1.73_m2} (ref >59)
GFR, EST AFRICAN AMERICAN: 96 mL/min/{1.73_m2} (ref >59)
GLUCOSE: 97 mg/dL (ref 65–99)
POTASSIUM: 4.4 mmol/L (ref 3.5–5.2)
Sodium: 139 mmol/L (ref 134–144)

## 2018-03-09 ENCOUNTER — Other Ambulatory Visit: Payer: 59

## 2018-03-12 ENCOUNTER — Encounter (HOSPITAL_COMMUNITY)
Admission: RE | Disposition: A | Discharge status: Home / Self Care | Payer: Self-pay | Source: Ambulatory Visit | Attending: Internal Medicine

## 2018-03-12 ENCOUNTER — Ambulatory Visit (HOSPITAL_COMMUNITY)
Admission: RE | Admit: 2018-03-12 | Discharge: 2018-03-12 | Disposition: A | Discharge status: Home / Self Care | Payer: 59 | Source: Ambulatory Visit | Attending: Internal Medicine | Admitting: Internal Medicine

## 2018-03-12 DIAGNOSIS — Z8249 Family history of ischemic heart disease and other diseases of the circulatory system: Secondary | ICD-10-CM | POA: Diagnosis not present

## 2018-03-12 DIAGNOSIS — I10 Essential (primary) hypertension: Secondary | ICD-10-CM | POA: Diagnosis not present

## 2018-03-12 DIAGNOSIS — Z950 Presence of cardiac pacemaker: Secondary | ICD-10-CM | POA: Insufficient documentation

## 2018-03-12 DIAGNOSIS — Z882 Allergy status to sulfonamides status: Secondary | ICD-10-CM | POA: Insufficient documentation

## 2018-03-12 DIAGNOSIS — G43909 Migraine, unspecified, not intractable, without status migrainosus: Secondary | ICD-10-CM | POA: Insufficient documentation

## 2018-03-12 DIAGNOSIS — I493 Ventricular premature depolarization: Secondary | ICD-10-CM

## 2018-03-12 DIAGNOSIS — K219 Gastro-esophageal reflux disease without esophagitis: Secondary | ICD-10-CM | POA: Diagnosis not present

## 2018-03-12 DIAGNOSIS — Z880 Allergy status to penicillin: Secondary | ICD-10-CM | POA: Insufficient documentation

## 2018-03-12 HISTORY — PX: PVC ABLATION: EP1236

## 2018-03-12 SURGERY — PVC ABLATION

## 2018-03-12 MED ORDER — FENTANYL CITRATE (PF) 100 MCG/2ML IJ SOLN
INTRAMUSCULAR | Status: AC
  Filled 2018-03-12: qty 2

## 2018-03-12 MED ORDER — BUPIVACAINE HCL (PF) 0.25 % IJ SOLN
INTRAMUSCULAR | Status: AC
  Filled 2018-03-12: qty 60

## 2018-03-12 MED ORDER — ONDANSETRON HCL 4 MG/2ML IJ SOLN
INTRAMUSCULAR | Status: AC
  Filled 2018-03-12: qty 2

## 2018-03-12 MED ORDER — ONDANSETRON HCL 4 MG/2ML IJ SOLN
INTRAMUSCULAR | Status: DC | PRN
  Administered 2018-03-12: 4 mg via INTRAVENOUS

## 2018-03-12 MED ORDER — HEPARIN (PORCINE) IN NACL 1000-0.9 UT/500ML-% IV SOLN
INTRAVENOUS | Status: AC
  Filled 2018-03-12: qty 1000

## 2018-03-12 MED ORDER — SODIUM CHLORIDE 0.9 % IV SOLN
INTRAVENOUS | Status: DC
  Administered 2018-03-12: 12:00:00 via INTRAVENOUS

## 2018-03-12 MED ORDER — FENTANYL CITRATE (PF) 100 MCG/2ML IJ SOLN
INTRAMUSCULAR | Status: DC | PRN
  Administered 2018-03-12: 25 ug via INTRAVENOUS

## 2018-03-12 MED ORDER — HEPARIN SODIUM (PORCINE) 1000 UNIT/ML IJ SOLN
INTRAMUSCULAR | Status: AC
  Filled 2018-03-12: qty 1

## 2018-03-12 MED ORDER — ISOPROTERENOL HCL 0.2 MG/ML IJ SOLN
INTRAVENOUS | Status: DC | PRN
  Administered 2018-03-12: 1 ug/min via INTRAVENOUS

## 2018-03-12 MED ORDER — ISOPROTERENOL HCL 0.2 MG/ML IJ SOLN
INTRAMUSCULAR | Status: AC
  Filled 2018-03-12: qty 5

## 2018-03-12 SURGICAL SUPPLY — 10 items
BAG SNAP BAND KOVER 36X36 (MISCELLANEOUS) IMPLANT
CATH HEX JOSEPH 2-5-2 65CM 6F (CATHETERS) IMPLANT
CATH JOSEPHSON QUAD-ALLRED 6FR (CATHETERS) IMPLANT
PACK EP LATEX FREE (CUSTOM PROCEDURE TRAY)
PACK EP LF (CUSTOM PROCEDURE TRAY) IMPLANT
PAD DEFIB LIFELINK (PAD) IMPLANT
SHEATH PINNACLE 6F 10CM (SHEATH) IMPLANT
SHEATH PINNACLE 7F 10CM (SHEATH) IMPLANT
SHEATH PINNACLE 8F 10CM (SHEATH) IMPLANT
SHIELD RADPAD SCOOP 12X17 (MISCELLANEOUS) IMPLANT

## 2018-03-12 NOTE — Progress Notes (Signed)
Dr Lovena Le notified of client with continued c/o migraine headache and per Dr Lovena Le ok to d/c home

## 2018-03-13 NOTE — H&P (Signed)
HPI Erin Casey is referred today by Dr. Caryl Comes to consider catheter ablation of her symptomatic PVC's. She is a pleasant 37 yo woman who has neurally mediated syncope, s/p PPM insertion several years ago. She has had recurrent palpitations due to what appears to be a mostly monomorphic PVC's. She has been treated with AA meds with minimal improvement. Review of her heart monitor/ppm  suggests that she has had about 2% of her beats as PVC's. She has been fairly intolerant of her AA drugs including flecainide and multaq. Her arrhythmia counters include her use of these drugs. No additional syncope.  Allergies  Allergen Reactions  . Sulfa Antibiotics Rash and Hives    Nausea  . Amoxicillin-Pot Clavulanate Other (See Comments)    [Derm] [GI]  . Sulfamethoxazole Rash    vomiting           Current Outpatient Medications  Medication Sig Dispense Refill  . dronedarone (MULTAQ) 400 MG tablet Take 1 tablet (400 mg total) by mouth 2 (two) times daily with a meal. 60 tablet 6  . methylphenidate 54 MG PO CR tablet Take 1 tablet (54 mg total) by mouth every morning. 90 tablet 0  . pantoprazole (PROTONIX) 40 MG tablet Take 40 mg by mouth daily.      No current facility-administered medications for this visit.          Past Medical History:  Diagnosis Date  . Depletion of volume of extracellular fluid   . Essential hypertension 04/20/2013  . GERD (gastroesophageal reflux disease)   . Neurologic cardiac syncope   . 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        Past Surgical History:  Procedure Laterality Date  . ABDOMINAL HYSTERECTOMY    . CESAREAN SECTION    . CHOLECYSTECTOMY    . hysterectomy    . PACEMAKER INSERTION    . TUBAL LIGATION            Family History  Problem Relation Age of Onset  . Diabetes Mother   . Hyperlipidemia Mother   . Hypertension Father   . Heart  disease Father   . Hyperlipidemia Father   . Mental illness Sister   . Cancer Maternal Grandmother   . Hyperlipidemia Maternal Grandmother   . Heart disease Maternal Grandfather   . Hyperlipidemia Maternal Grandfather   . Hyperlipidemia Paternal Grandmother   . Hypertension Paternal Grandmother   . Heart disease Paternal Grandfather   . Hyperlipidemia Paternal Grandfather      Social History        Socioeconomic History  . Marital status: Married    Spouse name: Not on file  . Number of children: Not on file  . Years of education: Not on file  . Highest education level: Not on file  Occupational History  . Not on file  Social Needs  . Financial resource strain: Not on file  . Food insecurity:    Worry: Not on file    Inability: Not on file  . Transportation needs:    Medical: Not on file    Non-medical: Not on file  Tobacco Use  . Smoking status: Never Smoker  . Smokeless tobacco: Never Used  Substance and Sexual Activity  . Alcohol use: No  . Drug use: Not on file  . Sexual activity: Not on file  Lifestyle  . Physical activity:    Days per week: Not on file  Minutes per session: Not on file  . Stress: Not on file  Relationships  . Social connections:    Talks on phone: Not on file    Gets together: Not on file    Attends religious service: Not on file    Active member of club or organization: Not on file    Attends meetings of clubs or organizations: Not on file    Relationship status: Not on file  . Intimate partner violence:    Fear of current or ex partner: Not on file    Emotionally abused: Not on file    Physically abused: Not on file    Forced sexual activity: Not on file  Other Topics Concern  . Not on file  Social History Narrative  . Not on file     BP 124/80   Pulse 83   Ht 5' 10.5" (1.791 m)   Wt 261 lb (118.4 kg)   SpO2 97%   BMI 36.92 kg/m   Physical Exam:  Well appearing  37 yo woman, NAD HEENT: Unremarkable Neck:  6 cm JVD, no thyromegally Lymphatics:  No adenopathy Back:  No CVA tenderness Lungs:  Clear with no wheezes HEART:  Regular rate rhythm, no murmurs, no rubs, no clicks Abd:  soft, positive bowel sounds, no organomegally, no rebound, no guarding Ext:  2 plus pulses, no edema, no cyanosis, no clubbing Skin:  No rashes no nodules Neuro:  CN II through XII intact, motor grossly intact  EKG - NSR with frequent PVC's.   DEVICE  Normal device function.  See PaceArt for details. Her biotronik PPM is working normally.  Assess/Plan: 1. PVC's - I have discussed the treatment options. I have some concern that despite her symptoms, the density of her PVC's is going to make ablation more difficulty especially with any sedation. Also, the relative narrowness of the QRS duration of her PVC's suggests that they are originating near her His purkinje system. I warned her that successful ablation could result in her using her PPM more often.  2. Autonomic dysfunction - she appears to be tolerating her current meds.  I spent over 25 minutes including 50% face to face time producing this note. Erin Casey,M.D   Ep Attending  Patient seen and examine. Agree with above. The patient will undergo EP study and ablation of PVC's.  Mikle Bosworth.D.

## 2018-03-15 ENCOUNTER — Encounter (HOSPITAL_COMMUNITY): Payer: Self-pay | Admitting: Internal Medicine

## 2018-03-30 ENCOUNTER — Other Ambulatory Visit: Payer: 59

## 2018-04-09 ENCOUNTER — Ambulatory Visit: Payer: 59 | Admitting: Internal Medicine

## 2018-04-12 ENCOUNTER — Telehealth: Payer: Self-pay

## 2018-04-12 NOTE — Telephone Encounter (Signed)
New Message   Pt c/o BP issue: STAT if pt c/o blurred vision, one-sided weakness or slurred speech  1. What are your last 5 BP readings? 80/60, when she stands up it doesn't register because its so low  2. Are you having any other symptoms (ex. Dizziness, headache, blurred vision, passed out)? Arhythmia, dizziness and feels faint when she stand  3. What is your BP issue?  pts states that whenever she stand her bp drops

## 2018-04-12 NOTE — Telephone Encounter (Signed)
Made appt with Dr. Lovena Le for this Wednesday 04/12/2018 at 3:30 pm.  Advised Pt to increase fluids, salt and stand slowly.  Pt states she has already been doing that and can't she see Dr. Caryl Comes?  Will send to Dr. Olin Pia nurse to see if Pt can be seen sooner by him.  Otherwise she will see Dr. Lovena Le on 04/14/2018.

## 2018-04-13 NOTE — Telephone Encounter (Signed)
Spoke with pt; she will keep her appt with GT and f/up with SK based off her appointment interventions.

## 2018-04-14 ENCOUNTER — Encounter: Payer: Self-pay | Admitting: Internal Medicine

## 2018-04-14 ENCOUNTER — Ambulatory Visit (INDEPENDENT_AMBULATORY_CARE_PROVIDER_SITE_OTHER): Payer: 59 | Admitting: Internal Medicine

## 2018-04-14 VITALS — BP 118/70 | HR 101 | Ht 70.5 in | Wt 260.0 lb

## 2018-04-14 DIAGNOSIS — I493 Ventricular premature depolarization: Secondary | ICD-10-CM | POA: Diagnosis not present

## 2018-04-14 DIAGNOSIS — R55 Syncope and collapse: Secondary | ICD-10-CM

## 2018-04-14 DIAGNOSIS — Z950 Presence of cardiac pacemaker: Secondary | ICD-10-CM

## 2018-04-14 LAB — CUP PACEART INCLINIC DEVICE CHECK
Brady Statistic RA Percent Paced: 55 %
Brady Statistic RV Percent Paced: 2 %
Date Time Interrogation Session: 20190717165729
Implantable Lead Implant Date: 20130708
Implantable Lead Location: 753859
Implantable Lead Model: 350
Implantable Lead Serial Number: 29222139
Implantable Lead Serial Number: 29231638
Lead Channel Pacing Threshold Amplitude: 0.6 V
Lead Channel Pacing Threshold Pulse Width: 0.4 ms
Lead Channel Sensing Intrinsic Amplitude: 3.4 mV
Lead Channel Setting Pacing Amplitude: 2.4 V
Lead Channel Setting Pacing Pulse Width: 0.4 ms
MDC IDC LEAD IMPLANT DT: 20130708
MDC IDC LEAD LOCATION: 753860
MDC IDC MSMT LEADCHNL RA IMPEDANCE VALUE: 546 Ohm
MDC IDC MSMT LEADCHNL RV IMPEDANCE VALUE: 546 Ohm
MDC IDC MSMT LEADCHNL RV PACING THRESHOLD AMPLITUDE: 1.1 V
MDC IDC MSMT LEADCHNL RV PACING THRESHOLD PULSEWIDTH: 0.4 ms
MDC IDC MSMT LEADCHNL RV SENSING INTR AMPL: 17.1 mV
MDC IDC PG IMPLANT DT: 20130708
MDC IDC SET LEADCHNL RA PACING AMPLITUDE: 2 V
Pulse Gen Serial Number: 66303845

## 2018-04-14 NOTE — Progress Notes (Signed)
HPI Ms. Pellegrino returns today for followup of her PPM and PVC's. She is a pleasant 37 yo woman with autonomic dysfunction who was noted to have PVC's but underwent EP testing. She was placed on the table and monitored for 30 minutes, was paced thru her PPM and given isuprel but had no PVC's. No ablation was attempted. She returns today for followup. She has orthostatic symptoms. She has not had frank syncope.  Allergies  Allergen Reactions  . Sulfa Antibiotics Hives, Nausea Only and Rash  . Amoxicillin-Pot Clavulanate Other (See Comments)    [Derm] [GI]  . Sulfamethoxazole Nausea And Vomiting and Rash     Current Outpatient Medications  Medication Sig Dispense Refill  . methylphenidate 54 MG PO CR tablet Take 1 tablet (54 mg total) by mouth every morning. 90 tablet 0  . pantoprazole (PROTONIX) 40 MG tablet Take 40 mg by mouth 2 (two) times daily.     . sennosides-docusate sodium (SENOKOT-S) 8.6-50 MG tablet Take 4 tablets by mouth at bedtime.     No current facility-administered medications for this visit.      Past Medical History:  Diagnosis Date  . Depletion of volume of extracellular fluid   . Essential hypertension 04/20/2013  . GERD (gastroesophageal reflux disease)   . Neurologic cardiac syncope   . 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   Past Surgical History:  Procedure Laterality Date  . ABDOMINAL HYSTERECTOMY    . CESAREAN SECTION    . CHOLECYSTECTOMY    . hysterectomy    . PACEMAKER INSERTION    . PVC ABLATION N/A 03/12/2018   Procedure: PVC ABLATION;  Surgeon: Evans Lance, MD;  Location: [facility];  Service: Cardiovascular;  Laterality: N/A;  . TUBAL LIGATION       Family History  Problem Relation Age of Onset  . Diabetes Mother   . Hyperlipidemia Mother   . Hypertension Father   . Heart disease Father   . Hyperlipidemia Father   . Mental illness Sister   . Cancer  Maternal Grandmother   . Hyperlipidemia Maternal Grandmother   . Heart disease Maternal Grandfather   . Hyperlipidemia Maternal Grandfather   . Hyperlipidemia Paternal Grandmother   . Hypertension Paternal Grandmother   . Heart disease Paternal Grandfather   . Hyperlipidemia Paternal Grandfather      Social History   Socioeconomic History  . Marital status: Divorced    Spouse name: Not on file  . Number of children: Not on file  . Years of education: Not on file  . Highest education level: Not on file  Occupational History  . Not on file  Social Needs  . Financial resource strain: Not on file  . Food insecurity:    Worry: Not on file    Inability: Not on file  . Transportation needs:    Medical: Not on file    Non-medical: Not on file  Tobacco Use  . Smoking status: Never Smoker  . Smokeless tobacco: Never Used  Substance and Sexual Activity  . Alcohol use: No  . Drug use: Not on file  . Sexual activity: Not on file  Lifestyle  . Physical activity:    Days per week: Not on file    Minutes per session: Not on file  . Stress: Not on file  Relationships  . Social connections:    Talks on phone: Not  on file    Gets together: Not on file    Attends religious service: Not on file    Active member of club or organization: Not on file    Attends meetings of clubs or organizations: Not on file    Relationship status: Not on file  . Intimate partner violence:    Fear of current or ex partner: Not on file    Emotionally abused: Not on file    Physically abused: Not on file    Forced sexual activity: Not on file  Other Topics Concern  . Not on file  Social History Narrative  . Not on file     BP 118/70   Pulse (!) 101   Ht 5' 10.5" (1.791 m)   Wt 260 lb (117.9 kg)   BMI 36.78 kg/m   Physical Exam:  Well appearing NAD HEENT: Unremarkable Neck:  No JVD, no thyromegally Lymphatics:  No adenopathy Back:  No CVA tenderness Lungs:  Clear HEART:  Regular rate  rhythm, no murmurs, no rubs, no clicks Abd:  soft, positive bowel sounds, no organomegally, no rebound, no guarding Ext:  2 plus pulses, no edema, no cyanosis, no clubbing Skin:  No rashes no nodules Neuro:  CN II through XII intact, motor grossly intact  EKG - sinus tachycardia  DEVICE  Normal device function.  See PaceArt for details. 3% PVC's are noted  Assess/Plan: 1. orthostasis - she is still having dizzy spells. She is encouraged to increase her salt and fluid intake.  2. PVC's - she has had 3% since we had her in for attempted EPS/RFA. I considered adding some beta blocker. I will defer to Dr. Renaldo Reel 3. Obesity - she needs to lose weight and to exercise.   Mikle Bosworth.D.

## 2018-04-14 NOTE — Patient Instructions (Signed)
Medication Instructions:  Your physician recommends that you continue on your current medications as directed. Please refer to the Current Medication list given to you today.  Drink more fluids. Increase your salt intake. Avoid the heat. Avoid caffeine. Avoid alcohol.  If your symptoms worsen call the office and will review need to start Florinef.  Labwork: None ordered.  Testing/Procedures: None ordered.  Follow-Up: Your physician wants you to follow-up in: 3 months with Dr. Caryl Comes.     Remote monitoring is used to monitor your Pacemaker from home. This monitoring reduces the number of office visits required to check your device to one time per year. It allows [place] to keep an eye on the functioning of your device to ensure it is working properly. You are scheduled for a device check from home on 05/20/2018. You may send your transmission at any time that day. If you have a wireless device, the transmission will be sent automatically. After your physician reviews your transmission, you will receive a postcard with your next transmission date.  Any Other Special Instructions Will Be Listed Below (If Applicable).  If you need a refill on your cardiac medications before your next appointment, please call your pharmacy.

## 2018-04-15 ENCOUNTER — Encounter: Payer: Self-pay | Admitting: Internal Medicine

## 2018-04-23 ENCOUNTER — Ambulatory Visit: Payer: 59 | Admitting: Internal Medicine

## 2018-04-27 ENCOUNTER — Ambulatory Visit: Payer: 59 | Admitting: Internal Medicine

## 2018-05-14 ENCOUNTER — Ambulatory Visit: Payer: 59 | Admitting: Internal Medicine

## 2018-05-20 ENCOUNTER — Ambulatory Visit (INDEPENDENT_AMBULATORY_CARE_PROVIDER_SITE_OTHER): Payer: 59 | Admitting: *Deleted

## 2018-05-20 DIAGNOSIS — I471 Supraventricular tachycardia: Secondary | ICD-10-CM

## 2018-05-20 DIAGNOSIS — R55 Syncope and collapse: Secondary | ICD-10-CM | POA: Diagnosis not present

## 2018-05-21 ENCOUNTER — Encounter: Payer: Self-pay | Admitting: Cardiology

## 2018-05-21 NOTE — Progress Notes (Signed)
Remote pacemaker transmission.   

## 2018-06-07 ENCOUNTER — Other Ambulatory Visit: Payer: Self-pay

## 2018-06-08 ENCOUNTER — Emergency Department (HOSPITAL_BASED_OUTPATIENT_CLINIC_OR_DEPARTMENT_OTHER): Payer: 59

## 2018-06-08 ENCOUNTER — Other Ambulatory Visit: Payer: Self-pay

## 2018-06-08 ENCOUNTER — Emergency Department (HOSPITAL_BASED_OUTPATIENT_CLINIC_OR_DEPARTMENT_OTHER)
Admission: EM | Admit: 2018-06-08 | Discharge: 2018-06-08 | Disposition: A | Discharge status: Home / Self Care | Payer: 59 | Attending: Emergency Medicine | Admitting: Emergency Medicine

## 2018-06-08 ENCOUNTER — Encounter (HOSPITAL_BASED_OUTPATIENT_CLINIC_OR_DEPARTMENT_OTHER): Payer: Self-pay | Admitting: Emergency Medicine

## 2018-06-08 DIAGNOSIS — I10 Essential (primary) hypertension: Secondary | ICD-10-CM | POA: Insufficient documentation

## 2018-06-08 DIAGNOSIS — T782XXA Anaphylactic shock, unspecified, initial encounter: Secondary | ICD-10-CM

## 2018-06-08 DIAGNOSIS — Y69 Unspecified misadventure during surgical and medical care: Secondary | ICD-10-CM | POA: Diagnosis not present

## 2018-06-08 DIAGNOSIS — T8069XA Other serum reaction due to other serum, initial encounter: Secondary | ICD-10-CM | POA: Diagnosis present

## 2018-06-08 DIAGNOSIS — T886XXA Anaphylactic reaction due to adverse effect of correct drug or medicament properly administered, initial encounter: Secondary | ICD-10-CM | POA: Diagnosis not present

## 2018-06-08 DIAGNOSIS — Z950 Presence of cardiac pacemaker: Secondary | ICD-10-CM | POA: Diagnosis not present

## 2018-06-08 MED ORDER — SODIUM CHLORIDE 0.9 % IV SOLN: INTRAVENOUS | Status: DC | PRN

## 2018-06-08 MED ORDER — ALBUTEROL SULFATE HFA 108 (90 BASE) MCG/ACT IN AERS
2.0000 | INHALATION_SPRAY | Freq: Once | RESPIRATORY_TRACT | Status: AC
  Administered 2018-06-08: 2 via RESPIRATORY_TRACT
  Filled 2018-06-08: qty 6.7

## 2018-06-08 MED ORDER — EPINEPHRINE 0.3 MG/0.3ML IJ SOAJ: 0.3000 mg | INTRAMUSCULAR | 1 refills | Status: DC | PRN

## 2018-06-08 MED ORDER — PREDNISONE 20 MG PO TABS: ORAL_TABLET | ORAL | 0 refills | Status: DC

## 2018-06-08 MED ORDER — DEXAMETHASONE SODIUM PHOSPHATE 10 MG/ML IJ SOLN
10.0000 mg | Freq: Once | INTRAMUSCULAR | Status: AC
  Administered 2018-06-08: 10 mg via INTRAVENOUS
  Filled 2018-06-08: qty 1

## 2018-06-08 MED ORDER — LACTATED RINGERS IV BOLUS
1000.0000 mL | Freq: Once | INTRAVENOUS | Status: AC
  Administered 2018-06-08: 1000 mL via INTRAVENOUS

## 2018-06-08 MED ORDER — FAMOTIDINE IN NACL 20-0.9 MG/50ML-% IV SOLN
20.0000 mg | Freq: Once | INTRAVENOUS | Status: AC
  Administered 2018-06-08: 20 mg via INTRAVENOUS
  Filled 2018-06-08: qty 50

## 2018-06-08 MED ORDER — DIPHENHYDRAMINE HCL 50 MG/ML IJ SOLN
25.0000 mg | Freq: Once | INTRAMUSCULAR | Status: AC
  Administered 2018-06-08: 25 mg via INTRAVENOUS
  Filled 2018-06-08: qty 1

## 2018-06-08 MED ORDER — EPINEPHRINE 0.3 MG/0.3ML IJ SOAJ
0.3000 mg | Freq: Once | INTRAMUSCULAR | Status: AC
  Administered 2018-06-08: 0.3 mg via INTRAMUSCULAR
  Filled 2018-06-08: qty 0.3

## 2018-06-08 MED FILL — EPINEPHRINE 0.3 MG AUTO-INJ: 0.3 | 2 days supply | Qty: 2 | Fill #0

## 2018-06-08 MED FILL — predniSONE 20 MG TABS: 20 | 12 days supply | Qty: 27 | Fill #0

## 2018-06-08 NOTE — ED Notes (Signed)
ED Provider at bedside. 

## 2018-06-08 NOTE — ED Triage Notes (Signed)
Pt states she got a flu shot about 30 min ago and immediately began feeling badly. She reports SOB, tongue itching and tingling. Also reports swelling to the arm where she received the shot.

## 2018-06-08 NOTE — ED Provider Notes (Signed)
Emergency Department Provider Note   I have reviewed the triage vital signs and the nursing notes.   HISTORY  Chief Complaint Allergic Reaction   HPI Erin Casey is a 37 y.o. female who received a flu shot earlier today and approximately 5 to 7 minutes later she started having significant swelling and pain around the area of the flu shot and subsequently started having shortness of breath, throat tingling, itching and hives over her torso.  No syncope or near syncopal episodes.  Never had a reaction of this before.  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  She states she also has episodes of V. tach but this is initiated by her pacemaker somehow and generally has a healthy heart otherwise.  She does state that she had an episode of pretty significant nausea but this seemed to pass.  No smoking history.  No other associated symptoms. No other associated or modifying symptoms.    Past Medical History:  Diagnosis Date  . Depletion of volume of extracellular fluid   . Essential hypertension 04/20/2013  . GERD (gastroesophageal reflux disease)   . Neurologic cardiac syncope   . 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 Heart palpitations 01/03/2016  . 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 Essential hypertension 04/20/2013  . 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 ABDOMINAL HYSTERECTOMY    . CESAREAN SECTION    . CHOLECYSTECTOMY    . hysterectomy    . PACEMAKER INSERTION    . PVC ABLATION N/A 03/12/2018   Procedure: PVC ABLATION;  Surgeon: Evans Lance, MD;  Location: [place] [facility];  Service: Cardiovascular;  Laterality: N/A;  . TUBAL LIGATION      Current Outpatient Rx  . Order #: [id number] Class: Print  . Order #: [id number] Class: Print  . Order #: [id number] Class: Historical Med  . Order #: [id number] Class: Print  .  Order #: [id number] Class: Historical Med    Allergies Sulfa antibiotics; Amoxicillin-pot clavulanate; and Sulfamethoxazole  Family History  Problem Relation Age of Onset  . Diabetes Mother   . Hyperlipidemia Mother   . Hypertension Father   . Heart disease Father   . Hyperlipidemia Father   . Mental illness Sister   . Cancer Maternal Grandmother   . Hyperlipidemia Maternal Grandmother   . Heart disease Maternal Grandfather   . Hyperlipidemia Maternal Grandfather   . Hyperlipidemia Paternal Grandmother   . Hypertension Paternal Grandmother   . Heart disease Paternal Grandfather   . Hyperlipidemia Paternal Grandfather     Social History Social History   Tobacco Use  . Smoking status: Never Smoker  . Smokeless tobacco: Never Used  Substance Use Topics  . Alcohol use: No  . Drug use: Not on file    Review of Systems  All other systems negative except as documented in the HPI. All pertinent positives and negatives as reviewed in the HPI. ____________________________________________   PHYSICAL EXAM:  VITAL SIGNS: ED Triage Vitals  Enc Vitals Group     BP 06/08/18 1152 117/66     Pulse Rate 06/08/18 1152 92     Resp 06/08/18 1152 18     Temp 06/08/18 1152 97.8 F (36.6 C)     Temp Source 06/08/18 1152 Oral     SpO2 06/08/18 1152 98 %     Weight 06/08/18 1151 255 lb (115.7 kg)  Height 06/08/18 1151 5\' 10"  (1.778 m)    Constitutional: Alert and oriented. Well appearing and in no acute distress. Eyes: Conjunctivae are normal. PERRL. EOMI. Head: Atraumatic. Nose: No congestion/rhinnorhea. Mouth/Throat: Mucous membranes are moist.  Oropharynx non-erythematous. No obvious angioedema Neck: No stridor.  No meningeal signs.   Cardiovascular: Normal rate, regular rhythm. Good peripheral circulation. Grossly normal heart sounds.   Respiratory: Normal respiratory effort.  No retractions. Lungs diminished bilaterally. Gastrointestinal: Soft and nontender. No distention.   Musculoskeletal: No lower extremity tenderness nor edema. No gross deformities of extremities. Neurologic:  Normal speech and language. No gross focal neurologic deficits are appreciated.  Skin:  Left shoulder with swelling and induration around shot site, hives on back   ____________________________________________   PROCEDURES  Procedure(s) performed:   Procedures  CRITICAL CARE Performed by: Merrily Pew Total critical care time: 35 minutes Critical care time was exclusive of separately billable procedures and treating other patients. Critical care was necessary to treat or prevent imminent or life-threatening deterioration. Critical care was time spent personally by me on the following activities: development of treatment plan with patient and/or surrogate as well as nursing, discussions with consultants, evaluation of patient's response to treatment, examination of patient, obtaining history from patient or surrogate, ordering and performing treatments and interventions, ordering and review of laboratory studies, ordering and review of radiographic studies, pulse oximetry and re-evaluation of patient's condition.  ____________________________________________   INITIAL IMPRESSION / ASSESSMENT AND PLAN / ED COURSE  Likely anaphylaxis with nausea, sob, hives after flu shot. Will treat for same. Requires observation afterwards.   Aggie Moats and has some episodes of shortness of breath I think it was more related to the albuterol and medications as her lungs actually improved and did not have any wheezing or diminished breath sounds subsequently.   Pertinent labs & imaging results that were available during my care of the patient were reviewed by me and considered in my medical decision making (see chart for details).  ____________________________________________  FINAL CLINICAL IMPRESSION(S) / ED DIAGNOSES  Final diagnoses:  Anaphylaxis, initial encounter     MEDICATIONS  GIVEN DURING THIS VISIT:  Medications  EPINEPHrine (EPI-PEN) injection 0.3 mg (0.3 mg Intramuscular Given 06/08/18 1235)  diphenhydrAMINE (BENADRYL) injection 25 mg (25 mg Intravenous Given 06/08/18 1236)  famotidine (PEPCID) IVPB 20 mg premix (0 mg Intravenous Stopped 06/08/18 1403)  dexamethasone (DECADRON) injection 10 mg (10 mg Intravenous Given 06/08/18 1237)  albuterol (PROVENTIL HFA;VENTOLIN HFA) 108 (90 Base) MCG/ACT inhaler 2 puff (2 puffs Inhalation Given by Other 06/08/18 1233)  lactated ringers bolus 1,000 mL (0 mLs Intravenous Stopped 06/08/18 1403)     NEW OUTPATIENT MEDICATIONS STARTED DURING THIS VISIT:  Discharge Medication List as of 06/08/2018  4:34 PM    START taking these medications   Details  EPINEPHrine 0.3 mg/0.3 mL IJ SOAJ injection Inject 0.3 mLs (0.3 mg total) into the muscle as needed (anaphylaxis)., Starting Tue 06/08/2018, Print    predniSONE (DELTASONE) 20 MG tablet 3 tabs po daily x 3 days, then 2 tabs x 3 days, then 1.5 tabs x 3 days, then 1 tab x 3 days, then 0.5 tabs x 3 days, Print        Note:  This note was prepared with assistance of Dragon voice recognition software. Occasional wrong-word or sound-a-like substitutions may have occurred due to the inherent limitations of voice recognition software.   Merrily Pew, MD 06/09/18 [phone]

## 2018-06-10 ENCOUNTER — Encounter (HOSPITAL_BASED_OUTPATIENT_CLINIC_OR_DEPARTMENT_OTHER): Payer: Self-pay

## 2018-06-10 ENCOUNTER — Other Ambulatory Visit: Payer: Self-pay

## 2018-06-10 ENCOUNTER — Emergency Department (HOSPITAL_BASED_OUTPATIENT_CLINIC_OR_DEPARTMENT_OTHER)
Admission: EM | Admit: 2018-06-10 | Discharge: 2018-06-10 | Disposition: A | Discharge status: Home / Self Care | Payer: 59 | Attending: Emergency Medicine | Admitting: Emergency Medicine

## 2018-06-10 DIAGNOSIS — Z79899 Other long term (current) drug therapy: Secondary | ICD-10-CM | POA: Diagnosis not present

## 2018-06-10 DIAGNOSIS — T782XXS Anaphylactic shock, unspecified, sequela: Secondary | ICD-10-CM | POA: Insufficient documentation

## 2018-06-10 DIAGNOSIS — Z951 Presence of aortocoronary bypass graft: Secondary | ICD-10-CM | POA: Insufficient documentation

## 2018-06-10 DIAGNOSIS — I10 Essential (primary) hypertension: Secondary | ICD-10-CM | POA: Diagnosis not present

## 2018-06-10 DIAGNOSIS — T7840XS Allergy, unspecified, sequela: Secondary | ICD-10-CM | POA: Diagnosis present

## 2018-06-10 MED ORDER — EPINEPHRINE 0.3 MG/0.3ML IJ SOAJ: 0.3000 mg | INTRAMUSCULAR | 1 refills | Status: DC | PRN

## 2018-06-10 MED ORDER — FAMOTIDINE IN NACL 20-0.9 MG/50ML-% IV SOLN
20.0000 mg | Freq: Once | INTRAVENOUS | Status: AC
  Administered 2018-06-10: 20 mg via INTRAVENOUS
  Filled 2018-06-10: qty 50

## 2018-06-10 MED ORDER — DIPHENHYDRAMINE HCL 50 MG/ML IJ SOLN
25.0000 mg | Freq: Once | INTRAMUSCULAR | Status: AC
  Administered 2018-06-10: 25 mg via INTRAVENOUS
  Filled 2018-06-10: qty 1

## 2018-06-10 MED ORDER — SODIUM CHLORIDE 0.9 % IV BOLUS
1000.0000 mL | Freq: Once | INTRAVENOUS | Status: AC
  Administered 2018-06-10: 1000 mL via INTRAVENOUS

## 2018-06-10 MED FILL — EPINEPHRINE 0.3 MG AUTO-INJ: 0.3 | 2 days supply | Qty: 2 | Fill #0

## 2018-06-10 NOTE — ED Notes (Signed)
Pt experienced tongue swelling, throat itching and drooling apprx 100pm. Pt tried albuterol- with no improvement. Felt swelling to lips and face. Pt reports improvement to symptoms post epipen

## 2018-06-10 NOTE — ED Triage Notes (Signed)
Pt reports recent visit for allergic rx to flu shot- rebounded yesterday and today. Today worse. No hives noted. No SOB noted. 10mg  albuterol, 50 benadryl, 1 epipen IM PTA. VSS.

## 2018-06-10 NOTE — Discharge Instructions (Signed)
I have refilled your Epi-Pen and you can pick it up at our pharmacy here. Please keep your appointment with your PCP for tomorrow.  If you have signs of anaphylaxis, please your Epi-Pen. If you continue to have issues after Epi-Pen use, please follow-up to the emergency room.  Continue taking the steroid taper as prescribed to you.  I hope that you begin to feel better soon.

## 2018-06-10 NOTE — ED Provider Notes (Signed)
[place] EMERGENCY DEPARTMENT Provider Note  CSN: [id number] Arrival date & time: 06/10/18  1441  History   Chief Complaint Chief Complaint  Patient presents with  . Allergic Reaction   HPI Erin Casey is a 37 y.o. female with a medical history of neurogenic cardiac syncope, HTN and GERD who presented to the ED following an anaphylactic reaction. Patient states that she was at a conference when she acutely started feeling SOB and wheezing. Then she began to have tongue and mouth swelling which prompted her to use her Epi-Pen (at ~ 1:45pm, 1 hour before ED arrival) which helped resolved the symptoms. Patient denies ingesting or being exposed to anything unusual. She reports only eating chicken breasts and chips. Denies chest pain, urticaria, palpitations, abdominal pain or N/V during this episode. Currently patient describes throat itchiness, but has no other complaints.  Past Medical History:  Diagnosis Date  . Depletion of volume of extracellular fluid   . Essential hypertension 04/20/2013  . GERD (gastroesophageal reflux disease)   . Neurologic cardiac syncope   . 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 Heart palpitations 01/03/2016  . 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 Essential hypertension 04/20/2013  . 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 ABDOMINAL HYSTERECTOMY    . CESAREAN SECTION    . CHOLECYSTECTOMY    . hysterectomy    . PACEMAKER INSERTION    . PVC ABLATION N/A 03/12/2018   Procedure: PVC ABLATION;  Surgeon: Evans Lance, MD;  Location: [place] [facility];  Service: Cardiovascular;  Laterality: N/A;  . TUBAL LIGATION       OB History   None      Home Medications    Prior to Admission medications   Medication Sig Start Date End Date Taking? Authorizing Provider  EPINEPHrine 0.3 mg/0.3 mL IJ SOAJ injection Inject 0.3 mLs (0.3 mg total)  into the muscle as needed (anaphylaxis). 06/10/18   Mortis, Alvie Heidelberg I, PA-C  methylphenidate 54 MG PO CR tablet Take 1 tablet (54 mg total) by mouth every morning. 02/17/18   Deboraha Sprang, MD  pantoprazole (PROTONIX) 40 MG tablet Take 40 mg by mouth 2 (two) times daily.  02/03/16   [provider]  predniSONE (DELTASONE) 20 MG tablet 3 tabs po daily x 3 days, then 2 tabs x 3 days, then 1.5 tabs x 3 days, then 1 tab x 3 days, then 0.5 tabs x 3 days 06/08/18   Mesner, Corene Cornea, MD  sennosides-docusate sodium (SENOKOT-S) 8.6-50 MG tablet Take 4 tablets by mouth at bedtime.    [provider]    Family History Family History  Problem Relation Age of Onset  . Diabetes Mother   . Hyperlipidemia Mother   . Hypertension Father   . Heart disease Father   . Hyperlipidemia Father   . Mental illness Sister   . Cancer Maternal Grandmother   . Hyperlipidemia Maternal Grandmother   . Heart disease Maternal Grandfather   . Hyperlipidemia Maternal Grandfather   . Hyperlipidemia Paternal Grandmother   . Hypertension Paternal Grandmother   . Heart disease Paternal Grandfather   . Hyperlipidemia Paternal Grandfather     Social History Social History   Tobacco Use  . Smoking status: Never Smoker  . Smokeless tobacco: Never Used  Substance Use Topics  . Alcohol use: No  . Drug use: Never     Allergies  Sulfa antibiotics; Amoxicillin-pot clavulanate; and Sulfamethoxazole   Review of Systems Review of Systems  Constitutional: Negative.   HENT: Negative for trouble swallowing and voice change.        Tongue swelling  Eyes: Negative.   Respiratory: Positive for chest tightness, shortness of breath and wheezing.   Cardiovascular: Negative for chest pain and palpitations.  Gastrointestinal: Negative for abdominal pain, nausea and vomiting.  Skin: Negative for rash.  Neurological: Negative for dizziness, weakness, light-headedness and numbness.   Physical Exam Updated Vital  Signs Pulse 88   Temp 98.2 F (36.8 C) (Oral)   Resp 19   Ht 5\' 10"  (1.778 m)   Wt 115.7 kg   SpO2 97%   BMI 36.59 kg/m   Physical Exam  Constitutional: She appears well-developed and well-nourished. No distress.  HENT:  Mouth/Throat: Uvula is midline and mucous membranes are normal. No trismus in the jaw. No uvula swelling. No posterior oropharyngeal edema or posterior oropharyngeal erythema.  Eyes: Pupils are equal, round, and reactive to light. Conjunctivae, EOM and lids are normal.  No periorbital edema.  Neck: Trachea normal, normal range of motion, full passive range of motion without pain and phonation normal. Neck supple.  Cardiovascular: Normal rate, regular rhythm and normal heart sounds.  No murmur heard. Pulmonary/Chest: Effort normal and breath sounds normal.  Abdominal: Soft. Normal appearance and bowel sounds are normal. There is no tenderness.  Neurological: She is alert.  Skin: Skin is warm. Capillary refill takes less than 2 seconds. No rash noted.  Psychiatric: She has a normal mood and affect. Her speech is normal and behavior is normal. Thought content normal. Cognition and memory are normal.  Nursing note and vitals reviewed.  ED Treatments / Results  Labs (all labs ordered are listed, but only abnormal results are displayed) Labs Reviewed - No data to display  EKG None  Radiology No results found.  Procedures Procedures (including critical care time)  Medications Ordered in ED Medications  sodium chloride 0.9 % bolus 1,000 mL (1,000 mLs Intravenous New Bag/Given 06/10/18 1554)  diphenhydrAMINE (BENADRYL) injection 25 mg (25 mg Intravenous Given 06/10/18 1557)  famotidine (PEPCID) IVPB 20 mg premix (0 mg Intravenous Stopped 06/10/18 1641)     Initial Impression / Assessment and Plan / ED Course  Triage vital signs and the nursing notes have been reviewed.  Pertinent labs & imaging results that were available during care of the patient were  reviewed and considered in medical decision making (see chart for details).  Patient presents ~ 1 hour after an anaphylactic event. She denies any new exposures or allergens that triggered anaphylaxis, but endorsed symptoms of throat swelling, difficulty breathing and tongue swelling which caused her to use her Epi-Pen. She reports that symptoms resolved, but still had residual sensation of throat itchiness. Physical exam is unremarkable. There are no physical exam findings consistent with active anaphylaxis. Patient states she has an appointment with her PCP tomorrow 06/11/18.  Clinical Course as of Jun 10 1701  Thu Jun 10, 2018  1558 Not currently in anaphylaxis, but continues to endorse throat tightness. Physical exam unremarkable. Will administer IV Benadryl and Pepcid for symptomatic relief. Patient currently on day 2-3 of steroid taper.   [GM]  0814 Patient re-evaluated and reports significant relief. Physical exam still unremarkable.   [GM]    Clinical Course User Index [GM] Mortis, Jonelle Sports, PA-C    Final Clinical Impressions(s) / ED Diagnoses  1. Anaphylaxis. No clear etiology. Possibly prolonged reaction from  flu shot that caused anaphylactic reaction 2 days. Refill of Epi-Pen given. Has appointment with PCP tomorrow 06/11/18 and will discuss need for follow-up with allergist. Education provided on anaphyalaxis and s/s that would warrant return to the ED.  Dispo: Home. After thorough clinical evaluation, this patient is determined to be medically stable and can be safely discharged with the previously mentioned treatment and/or outpatient follow-up/referral(s). At this time, there are no other apparent medical conditions that require further screening, evaluation or treatment.   Final diagnoses:  Anaphylaxis, sequela    ED Discharge Orders         Ordered    EPINEPHrine 0.3 mg/0.3 mL IJ SOAJ injection  As needed     06/10/18 1650            Mortis, [place] I,  PA-C 06/10/18 [place], [place], DO 06/10/18 2337

## 2018-06-13 ENCOUNTER — Other Ambulatory Visit: Payer: Self-pay

## 2018-06-13 ENCOUNTER — Emergency Department (HOSPITAL_BASED_OUTPATIENT_CLINIC_OR_DEPARTMENT_OTHER)
Admission: EM | Admit: 2018-06-13 | Discharge: 2018-06-13 | Disposition: A | Discharge status: Home / Self Care | Payer: 59 | Attending: Emergency Medicine | Admitting: Emergency Medicine

## 2018-06-13 ENCOUNTER — Encounter (HOSPITAL_BASED_OUTPATIENT_CLINIC_OR_DEPARTMENT_OTHER): Payer: Self-pay | Admitting: Emergency Medicine

## 2018-06-13 ENCOUNTER — Emergency Department (HOSPITAL_BASED_OUTPATIENT_CLINIC_OR_DEPARTMENT_OTHER): Payer: 59

## 2018-06-13 DIAGNOSIS — Z950 Presence of cardiac pacemaker: Secondary | ICD-10-CM | POA: Diagnosis not present

## 2018-06-13 DIAGNOSIS — R55 Syncope and collapse: Secondary | ICD-10-CM | POA: Insufficient documentation

## 2018-06-13 DIAGNOSIS — I10 Essential (primary) hypertension: Secondary | ICD-10-CM | POA: Insufficient documentation

## 2018-06-13 DIAGNOSIS — Z79899 Other long term (current) drug therapy: Secondary | ICD-10-CM | POA: Diagnosis not present

## 2018-06-13 DIAGNOSIS — E860 Dehydration: Secondary | ICD-10-CM | POA: Insufficient documentation

## 2018-06-13 LAB — BASIC METABOLIC PANEL
Anion gap: 7 (ref 5–15)
BUN: 14 mg/dL (ref 6–20)
CO2: 30 mmol/L (ref 22–32)
CREATININE: 1 mg/dL (ref 0.44–1.00)
Calcium: 8.3 mg/dL — ABNORMAL LOW (ref 8.9–10.3)
Chloride: 101 mmol/L (ref 98–111)
GFR calc non Af Amer: >60 mL/min (ref >60)
Glucose, Bld: 152 mg/dL — ABNORMAL HIGH (ref 70–99)
Potassium: 3.4 mmol/L — ABNORMAL LOW (ref 3.5–5.1)
Sodium: 138 mmol/L (ref 135–145)

## 2018-06-13 LAB — URINALYSIS, ROUTINE W REFLEX MICROSCOPIC
Bilirubin Urine: NEGATIVE
Glucose, UA: NEGATIVE mg/dL
Hgb urine dipstick: NEGATIVE
Ketones, ur: NEGATIVE mg/dL
Leukocytes, UA: NEGATIVE
Nitrite: NEGATIVE
PROTEIN: NEGATIVE mg/dL
Specific Gravity, Urine: 1.015 (ref 1.005–1.030)
pH: 6 (ref 5.0–8.0)

## 2018-06-13 LAB — CBC
HCT: 47.7 % — ABNORMAL HIGH (ref 36.0–46.0)
Hemoglobin: 16.3 g/dL — ABNORMAL HIGH (ref 12.0–15.0)
MCH: 30.9 pg (ref 26.0–34.0)
MCHC: 34.2 g/dL (ref 30.0–36.0)
MCV: 90.3 fL (ref 78.0–100.0)
PLATELETS: 178 10*3/uL (ref 150–400)
RBC: 5.28 MIL/uL — ABNORMAL HIGH (ref 3.87–5.11)
RDW: 12.5 % (ref 11.5–15.5)
WBC: 7.9 10*3/uL (ref 4.0–10.5)

## 2018-06-13 LAB — CBG MONITORING, ED: Glucose-Capillary: 123 mg/dL — ABNORMAL HIGH (ref 70–99)

## 2018-06-13 LAB — PREGNANCY, URINE: Preg Test, Ur: NEGATIVE

## 2018-06-13 MED ORDER — SODIUM CHLORIDE 0.9 % IV SOLN: INTRAVENOUS | Status: DC

## 2018-06-13 MED ORDER — SODIUM CHLORIDE 0.9 % IV BOLUS
500.0000 mL | Freq: Once | INTRAVENOUS | Status: AC
  Administered 2018-06-13: 500 mL via INTRAVENOUS

## 2018-06-13 NOTE — ED Notes (Signed)
Pt understood dc material. NAD noted. 

## 2018-06-13 NOTE — ED Notes (Signed)
[employer] company at bedside to interrogate pacemaker

## 2018-06-13 NOTE — ED Provider Notes (Signed)
[place] EMERGENCY DEPARTMENT Provider Note   CSN: [id number] Arrival date & time: 06/13/18  1118     History   Chief Complaint Chief Complaint  Patient presents with  . Loss of Consciousness    HPI Erin Casey is a 37 y.o. female.  Patient has a history of neurocardiogenic syncope.  Has a specialized pacemaker placed at [facility] for this.  Is to keep her heart rate up and prevent bradycardia.  Pacemaker is made by [employer].  Patient currently followed by Dr. Caryl Comes of the [facility] cardiology group.  Patient recently seen September 10 and September 12.  In the emergency department for questionable anaphylactic reaction secondary to the flu shot.  Patient still taking Benadryl and prednisone.  Patient had 3 episodes of syncope this morning.  Fairly close together at 930 and 945.  And then 1 shortly thereafter.  Patient had a lay on the floor because he tried to get up she felt as if she was going to pass out.  Patient brought in by EMS.  Patient here however is been able to get up and go to the bathroom and they also did orthostatic blood pressures and she passed that test without any symptoms.  Patient without any injuries from the syncopal events.  Patient felt that her heart rate was in the 40s when these occurred.     Past Medical History:  Diagnosis Date  . Depletion of volume of extracellular fluid   . Essential hypertension 04/20/2013  . GERD (gastroesophageal reflux disease)   . Neurologic cardiac syncope   . 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 Heart palpitations 01/03/2016  . 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 Essential hypertension 04/20/2013  . 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 ABDOMINAL HYSTERECTOMY    . CESAREAN SECTION    . CHOLECYSTECTOMY    . hysterectomy    . PACEMAKER INSERTION    . PVC ABLATION N/A 03/12/2018   Procedure: PVC ABLATION;   Surgeon: Evans Lance, MD;  Location: [facility];  Service: Cardiovascular;  Laterality: N/A;  . TUBAL LIGATION       OB History   None      Home Medications    Prior to Admission medications   Medication Sig Start Date End Date Taking? Authorizing Provider  EPINEPHrine 0.3 mg/0.3 mL IJ SOAJ injection Inject 0.3 mLs (0.3 mg total) into the muscle as needed (anaphylaxis). 06/10/18   Mortis, Alvie Heidelberg I, PA-C  methylphenidate 54 MG PO CR tablet Take 1 tablet (54 mg total) by mouth every morning. 02/17/18   Deboraha Sprang, MD  pantoprazole (PROTONIX) 40 MG tablet Take 40 mg by mouth 2 (two) times daily.  02/03/16   [provider]  predniSONE (DELTASONE) 20 MG tablet 3 tabs po daily x 3 days, then 2 tabs x 3 days, then 1.5 tabs x 3 days, then 1 tab x 3 days, then 0.5 tabs x 3 days 06/08/18   Mesner, Corene Cornea, MD  sennosides-docusate sodium (SENOKOT-S) 8.6-50 MG tablet Take 4 tablets by mouth at bedtime.    [provider]    Family History Family History  Problem Relation Age of Onset  . Diabetes Mother   . Hyperlipidemia Mother   . Hypertension Father   . Heart disease Father   . Hyperlipidemia Father   . Mental illness Sister   . Cancer Maternal Grandmother   .  Hyperlipidemia Maternal Grandmother   . Heart disease Maternal Grandfather   . Hyperlipidemia Maternal Grandfather   . Hyperlipidemia Paternal Grandmother   . Hypertension Paternal Grandmother   . Heart disease Paternal Grandfather   . Hyperlipidemia Paternal Grandfather     Social History Social History   Tobacco Use  . Smoking status: Never Smoker  . Smokeless tobacco: Never Used  Substance Use Topics  . Alcohol use: No  . Drug use: Never     Allergies   Sulfa antibiotics; Amoxicillin-pot clavulanate; and Sulfamethoxazole   Review of Systems Review of Systems  Constitutional: Negative for fever.  HENT: Negative for congestion.   Eyes: Negative for visual disturbance.    Respiratory: Negative for shortness of breath.   Cardiovascular: Negative for chest pain.  Gastrointestinal: Negative for abdominal pain, nausea and vomiting.  Genitourinary: Negative for dysuria.  Musculoskeletal: Negative for back pain and neck pain.  Skin: Negative for rash and wound.  Neurological: Positive for syncope and light-headedness. Negative for dizziness.  Hematological: Does not bruise/bleed easily.  Psychiatric/Behavioral: Negative for confusion.     Physical Exam Updated Vital Signs BP 115/77   Pulse 77   Temp 99 F (37.2 C) (Oral)   Resp 15   Ht 1.778 m (5\' 10" )   Wt 115.6 kg   SpO2 96%   BMI 36.57 kg/m   Physical Exam  Constitutional: She is oriented to person, place, and time. She appears well-developed and well-nourished. No distress.  HENT:  Head: Normocephalic and atraumatic.  Mucous membranes slightly dry.  Eyes: Pupils are equal, round, and reactive to light. Conjunctivae and EOM are normal.  Neck: Neck supple.  Cardiovascular: Normal rate and normal heart sounds.  Mostly regular but occasionally irregular  Pulmonary/Chest: Effort normal and breath sounds normal. No respiratory distress.  Abdominal: Soft. Bowel sounds are normal. There is no tenderness.  Neurological: She is alert and oriented to person, place, and time. No cranial nerve deficit or sensory deficit. She exhibits normal muscle tone. Coordination normal.  Skin: Skin is warm. Capillary refill takes less than 2 seconds. No rash noted.  Nursing note and vitals reviewed.    ED Treatments / Results  Labs (all labs ordered are listed, but only abnormal results are displayed) Labs Reviewed  CBC - Abnormal; Notable for the following components:      Result Value   RBC 5.28 (*)    Hemoglobin 16.3 (*)    HCT 47.7 (*)    All other components within normal limits  BASIC METABOLIC PANEL - Abnormal; Notable for the following components:   Potassium 3.4 (*)    Glucose, Bld 152 (*)     Calcium 8.3 (*)    All other components within normal limits  CBG MONITORING, ED - Abnormal; Notable for the following components:   Glucose-Capillary 123 (*)    All other components within normal limits  URINALYSIS, ROUTINE W REFLEX MICROSCOPIC  PREGNANCY, URINE   Results for orders placed or performed during the hospital encounter of 06/13/18  CBC  Result Value Ref Range   WBC 7.9 4.0 - 10.5 K/uL   RBC 5.28 (H) 3.87 - 5.11 MIL/uL   Hemoglobin 16.3 (H) 12.0 - 15.0 g/dL   HCT 47.7 (H) 36.0 - 46.0 %   MCV 90.3 78.0 - 100.0 fL   MCH 30.9 26.0 - 34.0 pg   MCHC 34.2 30.0 - 36.0 g/dL   RDW 12.5 11.5 - 15.5 %   Platelets 178 150 - 400 K/uL  Urinalysis, Routine w reflex microscopic  Result Value Ref Range   Color, Urine YELLOW YELLOW   APPearance CLEAR CLEAR   Specific Gravity, Urine 1.015 1.005 - 1.030   pH 6.0 5.0 - 8.0   Glucose, UA NEGATIVE NEGATIVE mg/dL   Hgb urine dipstick NEGATIVE NEGATIVE   Bilirubin Urine NEGATIVE NEGATIVE   Ketones, ur NEGATIVE NEGATIVE mg/dL   Protein, ur NEGATIVE NEGATIVE mg/dL   Nitrite NEGATIVE NEGATIVE   Leukocytes, UA NEGATIVE NEGATIVE  Pregnancy, urine  Result Value Ref Range   Preg Test, Ur NEGATIVE NEGATIVE  Basic metabolic panel  Result Value Ref Range   Sodium 138 135 - 145 mmol/L   Potassium 3.4 (L) 3.5 - 5.1 mmol/L   Chloride 101 98 - 111 mmol/L   CO2 30 22 - 32 mmol/L   Glucose, Bld 152 (H) 70 - 99 mg/dL   BUN 14 6 - 20 mg/dL   Creatinine, Ser 1.00 0.44 - 1.00 mg/dL   Calcium 8.3 (L) 8.9 - 10.3 mg/dL   GFR calc non Af Amer >60 >60 mL/min   GFR calc Af Amer >60 >60 mL/min   Anion gap 7 5 - 15  CBG monitoring, ED  Result Value Ref Range   Glucose-Capillary 123 (H) 70 - 99 mg/dL   [place]is an isolated day off I get a lot of those  EKG EKG Interpretation  Date/Time:  Sunday June 13 2018 11:28:15 EDT Ventricular Rate:  77 PR Interval:    QRS Duration: 107 QT Interval:  375 QTC Calculation: 425 R Axis:   28 Text  Interpretation:  Atrial-paced complexes Low voltage, precordial leads No significant change since last tracing Confirmed by Fredia Sorrow [phone]) on 06/13/2018 12:48:37 PM   Radiology Dg Chest 2 View  Result Date: 06/13/2018 CLINICAL DATA:  Syncope.  Hypotension. EXAM: CHEST - 2 VIEW COMPARISON:  06/08/2018 FINDINGS: Pacemaker appears the same. Heart size is normal. Mediastinal shadows are otherwise normal. The lungs are clear. The vascularity is normal. No effusions. No significant bone finding. IMPRESSION: No active disease.  Pacemaker. Electronically Signed   By: Nelson Chimes M.D.   On: 06/13/2018 13:28    Procedures Procedures (including critical care time)  Medications Ordered in ED Medications  0.9 %  sodium chloride infusion (has no administration in time range)  sodium chloride 0.9 % bolus 500 mL (0 mLs Intravenous Stopped 06/13/18 1354)  sodium chloride 0.9 % bolus 500 mL (500 mLs Intravenous New Bag/Given 06/13/18 1451)     Initial Impression / Assessment and Plan / ED Course  I have reviewed the triage vital signs and the nursing notes.  Pertinent labs & imaging results that were available during my care of the patient were reviewed by me and considered in my medical decision making (see chart for details).    Cardiac monitoring without any significant arrhythmias.  EKG here shows atrial paced rhythm.  Pacemaker was interrogated without any significant abnormalities.  No evidence of any bradycardia.  Did have some runs of tachycardia.  Patient here is now asymptomatic.  Labs suggestive of perhaps patient dehydrated.  Hemoglobin a little bit elevated.  Kidney function normal.  Patient received IV hydration here.  Patient able to ambulate fine no further symptoms.  Chest x-ray negative.  Patient will continue the prednisone I will have her stop the Benadryl.  She will give cardiology a call in the morning for close follow-up.  She will return for any new or worse  symptoms.   Final Clinical  Impressions(s) / ED Diagnoses   Final diagnoses:  Syncope, unspecified syncope type  Dehydration    ED Discharge Orders    None       Fredia Sorrow, MD 06/13/18 1614

## 2018-06-13 NOTE — Discharge Instructions (Addendum)
Interrogation of your pacemaker today showed no malfunctions.  The syncopal episodes may have been secondary to some dehydration.  No evidence of any significant bradycardia.  Call cardiology for follow-up.  Return for any new or worse symptoms.

## 2018-06-13 NOTE — ED Triage Notes (Signed)
Pt BIB [facility] for syncope. Pt was seen here Tuesday for anaphylaxis. Had several reactions through out the week. Pt has a pacemaker for neurocardiogenic syncope. Had two episodes of syncope today, denies injury.

## 2018-06-13 NOTE — ED Notes (Signed)
RN called [employer] and spoke with answering service. Rep is going to be paged by company

## 2018-06-22 ENCOUNTER — Encounter: Payer: Self-pay | Admitting: Internal Medicine

## 2018-06-22 ENCOUNTER — Ambulatory Visit (INDEPENDENT_AMBULATORY_CARE_PROVIDER_SITE_OTHER): Payer: 59 | Admitting: Internal Medicine

## 2018-06-22 VITALS — BP 120/86 | HR 99 | Ht 70.0 in | Wt 261.2 lb

## 2018-06-22 DIAGNOSIS — I471 Supraventricular tachycardia: Secondary | ICD-10-CM

## 2018-06-22 DIAGNOSIS — R55 Syncope and collapse: Secondary | ICD-10-CM | POA: Diagnosis not present

## 2018-06-22 DIAGNOSIS — Z950 Presence of cardiac pacemaker: Secondary | ICD-10-CM

## 2018-06-22 NOTE — Progress Notes (Signed)
Patient Care Team: Reita Cliche, MD as PCP - General (Internal Medicine)   HPI  Erin Casey is a 37 y.o. female Seen in follow-up for a [employer] CLS pacemaker(WF B with [facility]) implanted for symptomatic neurocardiogenic syncope.  With this, she has significant improvement in her overall symptoms.  There is no interval dizziness or syncope.  Last fall she developed lightheadedness and palpitations.  She was seen at [facility] by Dr. Rosita Fire who noted PVCs.  There are a couple of arrhythmic sequelae which she tried to address by decreasing the PVC triggers.  She has not tolerated the flecainide well.  She underwent Holter monitoring which demonstrated "multifocal PVCs "however, on reading the monitor today all the triggering beats remain unifocal.  2 she saw Dr. Elliot Cousin and underwent EP testing.  She was noninducible.  These arrhythmic sequela I included 2 different arrhythmias-the first appeared to be retrograde block into the AV node associated with AV pacing this would fatigue and conduction resumed.  The other was a slow wide-complex rhythm without discernible P waves that I thought was slow ventricular tachycardia; Dr. Rosita Fire apparently saw the same thing.  She continues to have slow episodes of ventricular tachycardia again detected now on her device with VA dissociation  She has developed a left upper extremity DVT related to infusion of incompatible drugs apparently.   Had a short-term course of Xarelto  This summer was a major struggle, poorly tolerating the heat  She got a flu shot complicated by anaphylaxis which recurred over a few days requiring epi pen  Had syncope X3 last week, occurring in cluster following the introduction of Benadryl and prednisone.  Orthostatics in the ER were notable only for a change in heart rate from 78--98  Echo 4/17 was normal     Past Medical History:  Diagnosis Date  . Depletion of volume of extracellular fluid   . Essential  hypertension 04/20/2013  . GERD (gastroesophageal reflux disease)   . Neurologic cardiac syncope   . 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 ABDOMINAL HYSTERECTOMY    . CESAREAN SECTION    . CHOLECYSTECTOMY    . hysterectomy    . PACEMAKER INSERTION    . PVC ABLATION N/A 03/12/2018   Procedure: PVC ABLATION;  Surgeon: Evans Lance, MD;  Location: [facility];  Service: Cardiovascular;  Laterality: N/A;  . TUBAL LIGATION      Current Outpatient Medications  Medication Sig Dispense Refill  . EPINEPHrine 0.3 mg/0.3 mL IJ SOAJ injection Inject 0.3 mLs (0.3 mg total) into the muscle as needed (anaphylaxis). 2 Device 1  . methylphenidate 54 MG PO CR tablet Take 1 tablet (54 mg total) by mouth every morning. 90 tablet 0  . pantoprazole (PROTONIX) 40 MG tablet Take 40 mg by mouth 2 (two) times daily.     . predniSONE (DELTASONE) 20 MG tablet 3 tabs po daily x 3 days, then 2 tabs x 3 days, then 1.5 tabs x 3 days, then 1 tab x 3 days, then 0.5 tabs x 3 days 27 tablet 0  . sennosides-docusate sodium (SENOKOT-S) 8.6-50 MG tablet Take 4 tablets by mouth at bedtime.     No current facility-administered medications for this visit.     Allergies  Allergen Reactions  . Flu Virus Vaccine Anaphylaxis  . Sulfa Antibiotics Hives, Nausea Only and Rash  . Amoxicillin-Pot Clavulanate Other (See Comments)    [Derm] [  GI]  . Sulfamethoxazole Nausea And Vomiting and Rash      Review of Systems negative except from HPI and PMH  Physical Exam BP 120/86   Pulse 99   Ht 5\' 10"  (1.778 m)   Wt 261 lb 3.2 oz (118.5 kg)   SpO2 97%   BMI 37.48 kg/m   Well developed and nourished in no acute distress HENT normal Neck supple with JVP-flat Clear Regular rate and rhythm, no murmurs or gallops Abd-soft with active BS No Clubbing cyanosis edema Skin-warm and dry A & Oriented  Grossly normal sensory and motor function   Assessment and   Plan  SVT  VT- NS/PVCs  Pacemaker -[employer]/CLS The patient's device was interrogated and the information was fully reviewed.  The device was reprogrammed to make more aggressive CLS  Neurocardiogenic syncope/dysautonomia  Chest pain    Recurrent syncope without an obvious trigger but temporally associated with the anaphylactic reactions to the flu vaccine and the accompanying therapies.  Poorly tolerated this summer.  Have encouraged her to try to pursue exercise  Continues with nonsustained ventricular tachycardia; however, The context of a normal heart we will follow.  She underwent EP testing Dr. Elliot Cousin 6/19 at which time she was noninducible  We spent more than 50% of our >25 min visit in face to face counseling regarding the above

## 2018-06-22 NOTE — Patient Instructions (Signed)

## 2018-06-25 LAB — CUP PACEART REMOTE DEVICE CHECK
Brady Statistic AP VS Percent: 48 %
Brady Statistic AS VP Percent: 0 %
Brady Statistic RA Percent Paced: 48 %
Brady Statistic RV Percent Paced: 0 %
Date Time Interrogation Session: 20190927050730
Implantable Lead Implant Date: 20130708
Implantable Lead Implant Date: 20130708
Implantable Lead Location: 753859
Implantable Lead Serial Number: 29231638
Lead Channel Pacing Threshold Amplitude: 1.1 V
Lead Channel Pacing Threshold Pulse Width: 0.4 ms
Lead Channel Pacing Threshold Pulse Width: 0.4 ms
Lead Channel Setting Pacing Amplitude: 2 V
Lead Channel Setting Pacing Amplitude: 2.4 V
MDC IDC LEAD LOCATION: 753860
MDC IDC LEAD SERIAL: 29222139
MDC IDC MSMT BATTERY REMAINING PERCENTAGE: 60 %
MDC IDC MSMT LEADCHNL RA IMPEDANCE VALUE: 517 Ohm
MDC IDC MSMT LEADCHNL RA PACING THRESHOLD AMPLITUDE: 0.6 V
MDC IDC MSMT LEADCHNL RV IMPEDANCE VALUE: 517 Ohm
MDC IDC PG IMPLANT DT: 20130708
MDC IDC SET LEADCHNL RV PACING PULSEWIDTH: 0.4 ms
MDC IDC STAT BRADY AP VP PERCENT: 0 %
MDC IDC STAT BRADY AS VS PERCENT: 52 %
Pulse Gen Serial Number: 66303845

## 2018-06-27 ENCOUNTER — Other Ambulatory Visit: Payer: Self-pay

## 2018-06-27 ENCOUNTER — Emergency Department (HOSPITAL_BASED_OUTPATIENT_CLINIC_OR_DEPARTMENT_OTHER)
Admission: EM | Admit: 2018-06-27 | Discharge: 2018-06-27 | Disposition: A | Discharge status: Home / Self Care | Payer: 59 | Attending: Emergency Medicine | Admitting: Emergency Medicine

## 2018-06-27 ENCOUNTER — Encounter (HOSPITAL_BASED_OUTPATIENT_CLINIC_OR_DEPARTMENT_OTHER): Payer: Self-pay | Admitting: Emergency Medicine

## 2018-06-27 DIAGNOSIS — I10 Essential (primary) hypertension: Secondary | ICD-10-CM | POA: Diagnosis not present

## 2018-06-27 DIAGNOSIS — T783XXA Angioneurotic edema, initial encounter: Secondary | ICD-10-CM | POA: Diagnosis not present

## 2018-06-27 DIAGNOSIS — R21 Rash and other nonspecific skin eruption: Secondary | ICD-10-CM | POA: Diagnosis present

## 2018-06-27 DIAGNOSIS — Z950 Presence of cardiac pacemaker: Secondary | ICD-10-CM | POA: Diagnosis not present

## 2018-06-27 DIAGNOSIS — Z9049 Acquired absence of other specified parts of digestive tract: Secondary | ICD-10-CM | POA: Diagnosis not present

## 2018-06-27 DIAGNOSIS — Z79899 Other long term (current) drug therapy: Secondary | ICD-10-CM | POA: Insufficient documentation

## 2018-06-27 MED ORDER — SODIUM CHLORIDE 0.9 % IV BOLUS
500.0000 mL | Freq: Once | INTRAVENOUS | Status: AC
  Administered 2018-06-27: 500 mL via INTRAVENOUS

## 2018-06-27 MED ORDER — CETIRIZINE HCL 5 MG PO TABS: 5.0000 mg | ORAL_TABLET | Freq: Every day | ORAL | 0 refills | Status: DC

## 2018-06-27 MED ORDER — FAMOTIDINE IN NACL 20-0.9 MG/50ML-% IV SOLN
20.0000 mg | Freq: Once | INTRAVENOUS | Status: AC
  Administered 2018-06-27: 20 mg via INTRAVENOUS
  Filled 2018-06-27: qty 50

## 2018-06-27 MED ORDER — METHYLPREDNISOLONE SODIUM SUCC 125 MG IJ SOLR
125.0000 mg | Freq: Once | INTRAMUSCULAR | Status: AC
  Administered 2018-06-27: 125 mg via INTRAVENOUS
  Filled 2018-06-27: qty 2

## 2018-06-27 MED ORDER — SODIUM CHLORIDE 0.9 % IV SOLN
INTRAVENOUS | Status: DC | PRN
  Administered 2018-06-27: 500 mL via INTRAVENOUS

## 2018-06-27 MED ORDER — DIPHENHYDRAMINE HCL 50 MG/ML IJ SOLN
25.0000 mg | Freq: Once | INTRAMUSCULAR | Status: AC
  Administered 2018-06-27: 25 mg via INTRAVENOUS
  Filled 2018-06-27: qty 1

## 2018-06-27 MED ORDER — PREDNISONE 10 MG (21) PO TBPK: ORAL_TABLET | Freq: Every day | ORAL | 0 refills | Status: DC

## 2018-06-27 MED ORDER — EPINEPHRINE PF 1 MG/ML IJ SOLN
0.3000 mg | Freq: Once | INTRAMUSCULAR | Status: AC
  Administered 2018-06-27: 0.3 mg via INTRAMUSCULAR
  Filled 2018-06-27: qty 1

## 2018-06-27 NOTE — ED Notes (Signed)
Hina, PA notified of pt's c/o throat feeling tight. ED Provider at bedside.

## 2018-06-27 NOTE — ED Notes (Signed)
Pt ambulated to BR without difficulty. States her Sx are improving and her throat is feeling better. Snack and gingerale given with verbal approval from PA

## 2018-06-27 NOTE — ED Triage Notes (Signed)
Patient states that she has had several reactions like this over the last 3 weeks  - the patient reports that she has tried to not eat anything that has what she thinks is causing the swelling. The patient denies any SOB  - but reports that her stomach, and face area swollen and she is having pitting edema

## 2018-06-27 NOTE — Discharge Instructions (Signed)
Return to ED for worsening symptoms, trouble breathing or trouble swallowing swelling of your lips or face, chest pain.

## 2018-06-27 NOTE — ED Provider Notes (Signed)
[facility] EMERGENCY DEPARTMENT Provider Note   CSN: [id number] Arrival date & time: 06/27/18  1128     History   Chief Complaint Chief Complaint  Patient presents with  . Facial Swelling    HPI Erin Casey is a 37 y.o. female with a past medical history of hypertension, status post pacemaker placement, who presents to ED for evaluation of possible allergic reaction that began yesterday.  States that she has had 3 reactions similar to this over the past 3 weeks.  Initially her symptoms began on 9/10 when she received the influenza vaccine.  She began developing which she describes as an anaphylactic reaction.  She was seen and evaluated here with improvement in her symptoms.  2 days later, she had a repeat reaction where she again had to use her EpiPen.  Since yesterday, she has had a rash to her face and now feels like she is having swelling of her hips and abdomen.  She did not use her EpiPen this time.  Denies any tongue swelling, trouble breathing.  She reports nausea but denies any vomiting.  She is unsure what is causing her to have the symptoms because she denies eating anything out of the ordinary for dinner last night.  She states that prior to the influenza vaccine, she had never developed an anaphylactic reaction to anything in the past.  Denies any chest pain.  HPI  Past Medical History:  Diagnosis Date  . Depletion of volume of extracellular fluid   . Essential hypertension 04/20/2013  . GERD (gastroesophageal reflux disease)   . Neurologic cardiac syncope   . 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 Heart palpitations 01/03/2016  . 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 Essential hypertension 04/20/2013  . 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 ABDOMINAL HYSTERECTOMY    . CESAREAN SECTION    . CHOLECYSTECTOMY    . hysterectomy    . PACEMAKER INSERTION      . PVC ABLATION N/A 03/12/2018   Procedure: PVC ABLATION;  Surgeon: Evans Lance, MD;  Location: [facility];  Service: Cardiovascular;  Laterality: N/A;  . TUBAL LIGATION       OB History   None      Home Medications    Prior to Admission medications   Medication Sig Start Date End Date Taking? Authorizing Provider  cetirizine (ZYRTEC) 5 MG tablet Take 1 tablet (5 mg total) by mouth daily. 06/27/18   Kashon Kraynak, PA-C  EPINEPHrine 0.3 mg/0.3 mL IJ SOAJ injection Inject 0.3 mLs (0.3 mg total) into the muscle as needed (anaphylaxis). 06/10/18   Mortis, Alvie Heidelberg I, PA-C  methylphenidate 54 MG PO CR tablet Take 1 tablet (54 mg total) by mouth every morning. 02/17/18   Deboraha Sprang, MD  pantoprazole (PROTONIX) 40 MG tablet Take 40 mg by mouth 2 (two) times daily.  02/03/16   [provider]  predniSONE (STERAPRED UNI-PAK 21 TAB) 10 MG (21) TBPK tablet Take by mouth daily. Take 6 tabs by mouth daily  for 2 days, then 5 tabs for 2 days, then 4 tabs for 2 days, then 3 tabs for 2 days, 2 tabs for 2 days, then 1 tab by mouth daily for 2 days 06/27/18   Shelly Coss, Gabrian Hoque, PA-C  sennosides-docusate sodium (SENOKOT-S) 8.6-50 MG tablet Take 4 tablets by mouth at bedtime.    [provider]  Family History Family History  Problem Relation Age of Onset  . Diabetes Mother   . Hyperlipidemia Mother   . Hypertension Father   . Heart disease Father   . Hyperlipidemia Father   . Mental illness Sister   . Cancer Maternal Grandmother   . Hyperlipidemia Maternal Grandmother   . Heart disease Maternal Grandfather   . Hyperlipidemia Maternal Grandfather   . Hyperlipidemia Paternal Grandmother   . Hypertension Paternal Grandmother   . Heart disease Paternal Grandfather   . Hyperlipidemia Paternal Grandfather     Social History Social History   Tobacco Use  . Smoking status: Never Smoker  . Smokeless tobacco: Never Used  Substance Use Topics  . Alcohol use: No  . Drug  use: Never     Allergies   Flu virus vaccine; Sulfa antibiotics; Amoxicillin-pot clavulanate; and Sulfamethoxazole   Review of Systems Review of Systems  Constitutional: Negative for appetite change, chills and fever.  HENT: Positive for facial swelling. Negative for ear pain, rhinorrhea, sneezing and sore throat.   Eyes: Negative for photophobia and visual disturbance.  Respiratory: Negative for cough, chest tightness, shortness of breath and wheezing.   Cardiovascular: Negative for chest pain and palpitations.  Gastrointestinal: Positive for abdominal distention. Negative for abdominal pain, blood in stool, constipation, diarrhea, nausea and vomiting.  Genitourinary: Negative for dysuria, hematuria and urgency.  Musculoskeletal: Negative for myalgias.  Skin: Positive for rash.  Neurological: Negative for dizziness, weakness and light-headedness.     Physical Exam Updated Vital Signs BP 120/64   Pulse 97   Temp 98.5 F (36.9 C) (Oral)   Resp 17   Ht 5\' 10"  (1.778 m)   Wt 118.5 kg   SpO2 97%   BMI 37.48 kg/m   Physical Exam  Constitutional: She appears well-developed and well-nourished. No distress.  HENT:  Head: Normocephalic and atraumatic.  Nose: Nose normal.  Mild periorbital edema noted.  Erythematous rash noted to bilateral cheeks and forehead.  No lip swelling noted.  Eyes: Conjunctivae and EOM are normal. Right eye exhibits no discharge. Left eye exhibits no discharge. No scleral icterus.  Neck: Normal range of motion. Neck supple.  Cardiovascular: Normal rate, regular rhythm, normal heart sounds and intact distal pulses. Exam reveals no gallop and no friction rub.  No murmur heard. Pulmonary/Chest: Effort normal and breath sounds normal. No respiratory distress.  Abdominal: Soft. Bowel sounds are normal. She exhibits no distension. There is no tenderness. There is no guarding.  Musculoskeletal: Normal range of motion. She exhibits no edema.  Neurological: She  is alert. She exhibits normal muscle tone. Coordination normal.  Skin: Skin is warm and dry. No rash noted.  Psychiatric: She has a normal mood and affect.  Nursing note and vitals reviewed.    ED Treatments / Results  Labs (all labs ordered are listed, but only abnormal results are displayed) Labs Reviewed - No data to display  EKG None  Radiology No results found.  Procedures Procedures (including critical care time)  CRITICAL CARE Performed by: Delia Heady   Total critical care time: 45 minutes  Critical care time was exclusive of separately billable procedures and treating other patients.  Critical care was necessary to treat or prevent imminent or life-threatening deterioration.  Critical care was time spent personally by me on the following activities: development of treatment plan with patient and/or surrogate as well as nursing, discussions with consultants, evaluation of patient's response to treatment, examination of patient, obtaining history from patient or surrogate,  ordering and performing treatments and interventions, ordering and review of laboratory studies, ordering and review of radiographic studies, pulse oximetry and re-evaluation of patient's condition.   Medications Ordered in ED Medications  0.9 %  sodium chloride infusion ( Intravenous Stopped 06/27/18 1421)  sodium chloride 0.9 % bolus 500 mL (0 mLs Intravenous Stopped 06/27/18 1421)  methylPREDNISolone sodium succinate (SOLU-MEDROL) 125 mg/2 mL injection 125 mg (125 mg Intravenous Given 06/27/18 1306)  diphenhydrAMINE (BENADRYL) injection 25 mg (25 mg Intravenous Given 06/27/18 1306)  famotidine (PEPCID) IVPB 20 mg premix (0 mg Intravenous Stopped 06/27/18 1401)  EPINEPHrine (ADRENALIN) 0.3 mg (0.3 mg Intramuscular Given 06/27/18 1355)     Initial Impression / Assessment and Plan / ED Course  I have reviewed the triage vital signs and the nursing notes.  Pertinent labs & imaging results that were  available during my care of the patient were reviewed by me and considered in my medical decision making (see chart for details).  Clinical Course as of Jun 27 1712  Sun Jun 27, 2018  1341 Allergic cocktail administering at this moment.  Patient reports beginning feeling sensation of throat closing up, scratchy throat and having trouble breathing.  No objective signs of anaphylaxis noted.  However, patient states that it has progressed to tongue swelling in the past.  She has had epinephrine IM twice in the past with significant improvement in her symptoms.  Will administer IM epinephrine and reassess.  Patient to be placed on cardiac monitor.   [HK]  1528 Patient reports improvement in her symptoms with epinephrine given.  Periorbital edema has improved.  We will continue to monitor.   [HK]  9323 Patient with continued relief in her symptoms.  No recurrence in symptoms noted.   [HK]    Clinical Course User Index [HK] Delia Heady, PA-C    37 year old female presents to ED for allergic reaction.  States that she began developing a rash and some swelling of her face last night.  No improvement with Benadryl.  She said history of similar symptoms a past since beginning of this month after she received her influenza vaccine.  Last finished a steroid Dosepak earlier in the week.  On exam there is periorbital edema noted.  No signs of lip swelling or anaphylaxis.  Patient is satting at 97 to 99% on room air.  She has a rash noted to her face.  Patient initially given allergic cocktail.  While this was running, patient began developing what she described as sensation of her throat closing, scratchy throat and trouble breathing.  Epinephrine was administered.  Patient was observed for appropriate amount of time with no recurrence of her symptoms.  I am unsure at this point what is causing her to have the symptoms if it could be residual effect of the flu vaccine that she received earlier in the month.   Nevertheless, she is scheduled to meet with a allergy specialist in the next few weeks.  I feel that she will benefit from allergy testing.  In the meantime we will encourage daily antihistamine and repeat steroid Dosepak.  Patient continues to be hemodynamically stable with improvement in her symptoms.  Advised to return to ED for any severe worsening symptoms.  Portions of this note were generated with [employer]. Dictation errors may occur despite best attempts at proofreading.   Final Clinical Impressions(s) / ED Diagnoses   Final diagnoses:  Angioedema, initial encounter    ED Discharge Orders  Ordered    cetirizine (ZYRTEC) 5 MG tablet  Daily     06/27/18 1712    predniSONE (STERAPRED UNI-PAK 21 TAB) 10 MG (21) TBPK tablet  Daily     06/27/18 1712           Delia Heady, PA-C 06/27/18 1713    Jola Schmidt, MD 06/28/18 1251

## 2018-06-27 NOTE — ED Notes (Signed)
ED Provider at bedside. 

## 2018-07-07 LAB — CUP PACEART INCLINIC DEVICE CHECK
Implantable Lead Implant Date: 20130708
Implantable Lead Model: 350
Implantable Lead Serial Number: 29222139
Implantable Pulse Generator Implant Date: 20130708
MDC IDC LEAD IMPLANT DT: 20130708
MDC IDC LEAD LOCATION: 753859
MDC IDC LEAD LOCATION: 753860
MDC IDC LEAD SERIAL: 29231638
MDC IDC PG SERIAL: 66303845
MDC IDC SESS DTM: 20191009145224

## 2018-07-12 ENCOUNTER — Encounter (HOSPITAL_BASED_OUTPATIENT_CLINIC_OR_DEPARTMENT_OTHER): Payer: Self-pay | Admitting: *Deleted

## 2018-07-12 ENCOUNTER — Emergency Department (HOSPITAL_BASED_OUTPATIENT_CLINIC_OR_DEPARTMENT_OTHER)
Admission: EM | Admit: 2018-07-12 | Discharge: 2018-07-12 | Disposition: A | Discharge status: Home / Self Care | Payer: 59 | Attending: Emergency Medicine | Admitting: Emergency Medicine

## 2018-07-12 ENCOUNTER — Other Ambulatory Visit: Payer: Self-pay

## 2018-07-12 DIAGNOSIS — I10 Essential (primary) hypertension: Secondary | ICD-10-CM | POA: Diagnosis not present

## 2018-07-12 DIAGNOSIS — R21 Rash and other nonspecific skin eruption: Secondary | ICD-10-CM | POA: Diagnosis present

## 2018-07-12 DIAGNOSIS — Z79899 Other long term (current) drug therapy: Secondary | ICD-10-CM | POA: Insufficient documentation

## 2018-07-12 DIAGNOSIS — T7840XA Allergy, unspecified, initial encounter: Secondary | ICD-10-CM | POA: Insufficient documentation

## 2018-07-12 MED ORDER — METHYLPREDNISOLONE SODIUM SUCC 125 MG IJ SOLR
125.0000 mg | Freq: Once | INTRAMUSCULAR | Status: AC
  Administered 2018-07-12: 125 mg via INTRAVENOUS
  Filled 2018-07-12: qty 2

## 2018-07-12 MED ORDER — FAMOTIDINE IN NACL 20-0.9 MG/50ML-% IV SOLN
20.0000 mg | Freq: Once | INTRAVENOUS | Status: AC
  Administered 2018-07-12: 20 mg via INTRAVENOUS
  Filled 2018-07-12: qty 50

## 2018-07-12 MED ORDER — DIPHENHYDRAMINE HCL 50 MG/ML IJ SOLN
25.0000 mg | Freq: Once | INTRAMUSCULAR | Status: AC
  Administered 2018-07-12: 25 mg via INTRAVENOUS
  Filled 2018-07-12: qty 1

## 2018-07-12 MED ORDER — PREDNISONE 20 MG PO TABS: 40.0000 mg | ORAL_TABLET | Freq: Every day | ORAL | 0 refills | Status: DC

## 2018-07-12 NOTE — ED Notes (Signed)
Family at bedside. 

## 2018-07-12 NOTE — ED Provider Notes (Signed)
[facility] Provider Note   CSN: [id number] Arrival date & time: 07/12/18  1845     History   Chief Complaint Chief Complaint  Patient presents with  . Allergic Reaction    HPI Erin Casey is a 37 y.o. female.  Patient is a 37 year old female with a history of hypertension, GERD, non-cardiogenic syncope who is presenting now for the fourth time in a month with acute allergic reaction.  She states it all started after getting the flu shot.  She has had to receive epi multiple times and has now been off prednisone for 4 days and symptoms started again this evening.  She notes a rash to her face and swelling.  She feels like her throat is itchy and she felt like she was wheezing earlier.  Symptoms started about 5 but got worse at 6.  She did take Benadryl and albuterol at home but has not used epi.  She is planning to see an allergist but has not gotten appointment yet  The history is provided by the patient.  Allergic Reaction  Presenting symptoms: difficulty breathing, difficulty swallowing, itching, rash, swelling and wheezing   Severity:  Moderate Duration:  2 hours Prior allergic episodes:  Unable to specify Relieved by:  Nothing Ineffective treatments:  Bronchodilators and antihistamines   Past Medical History:  Diagnosis Date  . Depletion of volume of extracellular fluid   . Essential hypertension 04/20/2013  . GERD (gastroesophageal reflux disease)   . Neurologic cardiac syncope   . 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 Heart palpitations 01/03/2016  . 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 Essential hypertension 04/20/2013  . 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 ABDOMINAL HYSTERECTOMY    . CESAREAN SECTION    . CHOLECYSTECTOMY    . hysterectomy    . PACEMAKER INSERTION    . PVC ABLATION N/A 03/12/2018   Procedure: PVC  ABLATION;  Surgeon: Evans Lance, MD;  Location: [facility];  Service: Cardiovascular;  Laterality: N/A;  . TUBAL LIGATION       OB History   None      Home Medications    Prior to Admission medications   Medication Sig Start Date End Date Taking? Authorizing Provider  cetirizine (ZYRTEC) 5 MG tablet Take 1 tablet (5 mg total) by mouth daily. 06/27/18   Khatri, Hina, PA-C  EPINEPHrine 0.3 mg/0.3 mL IJ SOAJ injection Inject 0.3 mLs (0.3 mg total) into the muscle as needed (anaphylaxis). 06/10/18   Mortis, Alvie Heidelberg I, PA-C  methylphenidate 54 MG PO CR tablet Take 1 tablet (54 mg total) by mouth every morning. 02/17/18   Deboraha Sprang, MD  pantoprazole (PROTONIX) 40 MG tablet Take 40 mg by mouth 2 (two) times daily.  02/03/16   [provider]  predniSONE (STERAPRED UNI-PAK 21 TAB) 10 MG (21) TBPK tablet Take by mouth daily. Take 6 tabs by mouth daily  for 2 days, then 5 tabs for 2 days, then 4 tabs for 2 days, then 3 tabs for 2 days, 2 tabs for 2 days, then 1 tab by mouth daily for 2 days 06/27/18   Shelly Coss, Hina, PA-C  sennosides-docusate sodium (SENOKOT-S) 8.6-50 MG tablet Take 4 tablets by mouth at bedtime.    [provider]    Family History Family History  Problem Relation Age of Onset  . Diabetes Mother   .  Hyperlipidemia Mother   . Hypertension Father   . Heart disease Father   . Hyperlipidemia Father   . Mental illness Sister   . Cancer Maternal Grandmother   . Hyperlipidemia Maternal Grandmother   . Heart disease Maternal Grandfather   . Hyperlipidemia Maternal Grandfather   . Hyperlipidemia Paternal Grandmother   . Hypertension Paternal Grandmother   . Heart disease Paternal Grandfather   . Hyperlipidemia Paternal Grandfather     Social History Social History   Tobacco Use  . Smoking status: Never Smoker  . Smokeless tobacco: Never Used  Substance Use Topics  . Alcohol use: No  . Drug use: Never     Allergies   Flu virus vaccine;  Sulfa antibiotics; Amoxicillin-pot clavulanate; and Sulfamethoxazole   Review of Systems Review of Systems  HENT: Positive for trouble swallowing.   Respiratory: Positive for wheezing.   Skin: Positive for itching and rash.  All other systems reviewed and are negative.    Physical Exam Updated Vital Signs BP (!) 116/45   Pulse (!) 111   Temp 98.7 F (37.1 C) (Oral)   Resp 18   Ht 5\' 10"  (1.778 m)   Wt 118 kg   SpO2 100%   BMI 37.33 kg/m   Physical Exam  Constitutional: She is oriented to person, place, and time. She appears well-developed and well-nourished. No distress.  HENT:  Head: Normocephalic and atraumatic.  Mouth/Throat: Oropharynx is clear and moist.  No uvula or tongue swelling.  Facial rash and swelling noted  Eyes: Pupils are equal, round, and reactive to light. Conjunctivae and EOM are normal.  Neck: Normal range of motion. Neck supple.  No stridor  Cardiovascular: Regular rhythm and intact distal pulses. Tachycardia present.  No murmur heard. Pulmonary/Chest: Effort normal and breath sounds normal. No respiratory distress. She has no wheezes. She has no rales.  Abdominal: Soft. She exhibits no distension. There is no tenderness. There is no rebound and no guarding.  Musculoskeletal: Normal range of motion. She exhibits no edema or tenderness.  Neurological: She is alert and oriented to person, place, and time.  Skin: Skin is warm and dry. Rash noted. No erythema.  Maculopapular erythematous lesions over the face and upper chest  Psychiatric: She has a normal mood and affect. Her behavior is normal.  Nursing note and vitals reviewed.    ED Treatments / Results  Labs (all labs ordered are listed, but only abnormal results are displayed) Labs Reviewed - No data to display  EKG None  Radiology No results found.  Procedures Procedures (including critical care time)  Medications Ordered in ED Medications  famotidine (PEPCID) IVPB 20 mg premix (20  mg Intravenous New Bag/Given 07/12/18 1904)  methylPREDNISolone sodium succinate (SOLU-MEDROL) 125 mg/2 mL injection 125 mg (125 mg Intravenous Given 07/12/18 1904)  diphenhydrAMINE (BENADRYL) injection 25 mg (25 mg Intravenous Given 07/12/18 1904)     Initial Impression / Assessment and Plan / ED Course  I have reviewed the triage vital signs and the nursing notes.  Pertinent labs & imaging results that were available during my care of the patient were reviewed by me and considered in my medical decision making (see chart for details).     Returning today for recurrent allergic reaction.  This is the fourth time this month and all started after getting the flu vaccine.  She has been very careful to stay away from anything that could possibly could be causing this.  She has now been off prednisone for  4 days and symptoms restarted tonight.  She has never had allergic reactions in the past but in the last month has required epi 3 times.  She took Benadryl at home and albuterol but it was not feeling better.  She has not used epinephrine today.  Patient has no evidence of oral swelling, stridor or wheezing.  Tachycardic but otherwise vital signs look normal.  Will give Benadryl, Solu-Medrol and Pepcid and reevaluate.  If symptoms progress will give epinephrine  8:21 PM And is feeling much better.  We will have her increase her Zyrtec and put her back on prednisone until she sees the allergist on Thursday.  Final Clinical Impressions(s) / ED Diagnoses   Final diagnoses:  Allergic reaction, initial encounter    ED Discharge Orders         Ordered    predniSONE (DELTASONE) 20 MG tablet  Daily     07/12/18 2020           Blanchie Dessert, MD 07/12/18 2022

## 2018-07-12 NOTE — ED Notes (Signed)
ED Provider at bedside. 

## 2018-07-12 NOTE — Discharge Instructions (Signed)
Increase your Zyrtec to 2 times a day until you see the allergist.  Continue taking prednisone until you see the allergist.

## 2018-07-12 NOTE — ED Triage Notes (Signed)
Pt c/o allergic reaction x 2 hrs ago , benadryl 25mg  , albuterol inhaler. Hx of same  MD at bedside

## 2018-07-13 ENCOUNTER — Encounter (HOSPITAL_COMMUNITY): Payer: Self-pay | Admitting: Emergency Medicine

## 2018-07-13 ENCOUNTER — Emergency Department (HOSPITAL_COMMUNITY)
Admission: EM | Admit: 2018-07-13 | Discharge: 2018-07-13 | Disposition: A | Discharge status: Home / Self Care | Payer: 59 | Attending: Emergency Medicine | Admitting: Emergency Medicine

## 2018-07-13 ENCOUNTER — Emergency Department (HOSPITAL_COMMUNITY): Payer: 59

## 2018-07-13 ENCOUNTER — Other Ambulatory Visit: Payer: Self-pay

## 2018-07-13 DIAGNOSIS — I10 Essential (primary) hypertension: Secondary | ICD-10-CM | POA: Diagnosis not present

## 2018-07-13 DIAGNOSIS — Z79899 Other long term (current) drug therapy: Secondary | ICD-10-CM | POA: Diagnosis not present

## 2018-07-13 DIAGNOSIS — R0789 Other chest pain: Secondary | ICD-10-CM | POA: Diagnosis not present

## 2018-07-13 DIAGNOSIS — Z950 Presence of cardiac pacemaker: Secondary | ICD-10-CM | POA: Diagnosis not present

## 2018-07-13 DIAGNOSIS — R079 Chest pain, unspecified: Secondary | ICD-10-CM | POA: Diagnosis present

## 2018-07-13 LAB — BASIC METABOLIC PANEL
ANION GAP: 8 (ref 5–15)
BUN: 8 mg/dL (ref 6–20)
CALCIUM: 9.1 mg/dL (ref 8.9–10.3)
CHLORIDE: 105 mmol/L (ref 98–111)
CO2: 25 mmol/L (ref 22–32)
Creatinine, Ser: 0.88 mg/dL (ref 0.44–1.00)
GFR calc Af Amer: >60 mL/min (ref >60)
GFR calc non Af Amer: >60 mL/min (ref >60)
GLUCOSE: 152 mg/dL — AB (ref 70–99)
POTASSIUM: 3.9 mmol/L (ref 3.5–5.1)
Sodium: 138 mmol/L (ref 135–145)

## 2018-07-13 LAB — I-STAT TROPONIN, ED
TROPONIN I, POC: 0 ng/mL (ref 0.00–0.08)
Troponin i, poc: 0 ng/mL (ref 0.00–0.08)

## 2018-07-13 LAB — CBC
HEMATOCRIT: 48.2 % — AB (ref 36.0–46.0)
HEMOGLOBIN: 15.6 g/dL — AB (ref 12.0–15.0)
MCH: 30.1 pg (ref 26.0–34.0)
MCHC: 32.4 g/dL (ref 30.0–36.0)
MCV: 92.9 fL (ref 80.0–100.0)
Platelets: 223 10*3/uL (ref 150–400)
RBC: 5.19 MIL/uL — AB (ref 3.87–5.11)
RDW: 12.2 % (ref 11.5–15.5)
WBC: 11.4 10*3/uL — ABNORMAL HIGH (ref 4.0–10.5)
nRBC: 0 % (ref 0.0–0.2)

## 2018-07-13 LAB — I-STAT BETA HCG BLOOD, ED (MC, WL, AP ONLY): I-stat hCG, quantitative: <5 m[IU]/mL (ref <5)

## 2018-07-13 LAB — D-DIMER, QUANTITATIVE (NOT AT ARMC): D DIMER QUANT: 0.53 ug{FEU}/mL — AB (ref 0.00–0.50)

## 2018-07-13 MED ORDER — OXYCODONE-ACETAMINOPHEN 5-325 MG PO TABS
1.0000 | ORAL_TABLET | Freq: Once | ORAL | Status: AC
  Administered 2018-07-13: 1 via ORAL
  Filled 2018-07-13: qty 1

## 2018-07-13 MED ORDER — IPRATROPIUM-ALBUTEROL 0.5-2.5 (3) MG/3ML IN SOLN
3.0000 mL | Freq: Once | RESPIRATORY_TRACT | Status: AC
  Administered 2018-07-13: 3 mL via RESPIRATORY_TRACT
  Filled 2018-07-13: qty 3

## 2018-07-13 MED ORDER — IOPAMIDOL (ISOVUE-370) INJECTION 76%
100.0000 mL | Freq: Once | INTRAVENOUS | Status: AC | PRN
  Administered 2018-07-13: 100 mL via INTRAVENOUS

## 2018-07-13 MED ORDER — IOPAMIDOL (ISOVUE-370) INJECTION 76%
INTRAVENOUS | Status: AC
  Filled 2018-07-13: qty 100

## 2018-07-13 NOTE — ED Notes (Signed)
Patient transported to CT 

## 2018-07-13 NOTE — ED Provider Notes (Addendum)
[facility] DEPARTMENT Provider Note   CSN: [id number] Arrival date & time: 07/13/18  1634     History   Chief Complaint Chief Complaint  Patient presents with  . Chest Pain  . Shortness of Breath  . Allergic Reaction    HPI Erin Casey is a 37 y.o. female with history of neurocardiogenic syncope with Biotronik pacemaker is here for evaluation of chest pain, onset this morning initially intermittent now constant since 3pm.  Patient states at baseline she typically gets intermittent chest pain however today her chest pain is different.  Chest pain is described as an elephant sitting on her chest.  Chest pain is in the left chest with radiation to the left shoulder, constant.  It is worse when she is sitting down or laying down. CP and SOB improve when she walks or sits up straight.  Currently the chest pain is 9/10.  Shortness of breath is described as harder to breathe however painless.  Associated symptoms include jaw and diffuse back pain, nausea.  No interventions PTA.   Additionally, pt reports recent allergic reactions since she got the flu shot last month.  She has been seen in the ER multiple times for anaphylaxis and allergic type reactions.  She has been on prednisone multiple times and has received 2-3 doses of epinephrine over the course of the last few weeks.  Last night, she developed worsening rash to her face and swelling, itchy throat and wheezing.  She went to [facility] and was observed, discharged with prednisone after receiving 125 mg Solu-Medrol IV prior to discharge.  She takes concerta for BP/HR, compliant.    Denies fevers, cough, sore throat, throat swelling, congestion, vomiting, abdominal pain, diarrhea, palpitations, LE swelilng or calf tenderness. Has h/o LUE blood clot after IV, but no DVT/PE. No recent travel, immobilization, surgery, malignancy. No h/o DM, HTN, HLD, tobacco use, family h/o young onset CAD.     HPI  Past Medical History:  Diagnosis Date  . Depletion of volume of extracellular fluid   . Essential hypertension 04/20/2013  . GERD (gastroesophageal reflux disease)   . Neurologic cardiac syncope   . 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 Heart palpitations 01/03/2016  . 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 Essential hypertension 04/20/2013  . 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 ABDOMINAL HYSTERECTOMY    . CESAREAN SECTION    . CHOLECYSTECTOMY    . hysterectomy    . PACEMAKER INSERTION    . PVC ABLATION N/A 03/12/2018   Procedure: PVC ABLATION;  Surgeon: Evans Lance, MD;  Location: [facility];  Service: Cardiovascular;  Laterality: N/A;  . TUBAL LIGATION       OB History   None      Home Medications    Prior to Admission medications   Medication Sig Start Date End Date Taking? Authorizing Provider  cetirizine (ZYRTEC) 5 MG tablet Take 1 tablet (5 mg total) by mouth daily. Patient taking differently: Take 10 mg by mouth 2 (two) times daily.  06/27/18  Yes Khatri, Hina, PA-C  EPINEPHrine 0.3 mg/0.3 mL IJ SOAJ injection Inject 0.3 mLs (0.3 mg total) into the muscle as needed (anaphylaxis). 06/10/18  Yes Mortis, Jonelle Sports, PA-C  methylphenidate 54 MG PO CR tablet Take 1 tablet (54 mg total) by mouth every morning. 02/17/18  Yes Deboraha Sprang,  MD  pantoprazole (PROTONIX) 40 MG tablet Take 40 mg by mouth 2 (two) times daily.  02/03/16  Yes [provider]  predniSONE (DELTASONE) 10 MG tablet Take 40 mg by mouth See admin instructions. Take 4 tablets by mouth daily for four days   Yes [provider]  sennosides-docusate sodium (SENOKOT-S) 8.6-50 MG tablet Take 4 tablets by mouth at bedtime.   Yes [provider]    Family History Family History  Problem Relation Age of Onset  . Diabetes Mother   . Hyperlipidemia Mother   .  Hypertension Father   . Heart disease Father   . Hyperlipidemia Father   . Mental illness Sister   . Cancer Maternal Grandmother   . Hyperlipidemia Maternal Grandmother   . Heart disease Maternal Grandfather   . Hyperlipidemia Maternal Grandfather   . Hyperlipidemia Paternal Grandmother   . Hypertension Paternal Grandmother   . Heart disease Paternal Grandfather   . Hyperlipidemia Paternal Grandfather     Social History Social History   Tobacco Use  . Smoking status: Never Smoker  . Smokeless tobacco: Never Used  Substance Use Topics  . Alcohol use: No  . Drug use: Never     Allergies   Flu virus vaccine; Sulfa antibiotics; Amoxicillin-pot clavulanate; and Sulfamethoxazole   Review of Systems Review of Systems  Respiratory: Positive for shortness of breath.   Cardiovascular: Positive for chest pain.  Gastrointestinal: Positive for nausea.  Musculoskeletal: Positive for arthralgias.  Skin: Positive for rash (face).  Neurological: Positive for headaches.  All other systems reviewed and are negative.    Physical Exam Updated Vital Signs BP 132/89   Pulse 81   Temp 97.7 F (36.5 C) (Oral)   Resp 17   Ht 5\' 10"  (1.778 m)   Wt 118 kg   SpO2 95%   BMI 37.33 kg/m   Physical Exam  Constitutional: She is oriented to person, place, and time. She appears well-developed and well-nourished. No distress.  NAD.  HENT:  Head: Normocephalic and atraumatic.  Right Ear: External ear normal.  Left Ear: External ear normal.  Nose: Nose normal.  Mild erythema across bilateral cheeks and bridge of the nose, nontender, no lesions.  Mild puffiness to area below eyes bilaterally. Mild clear rhinorrhea bilaterally. Oropharynx and tonsils visualized without edema, asymmetry, erythema, exudates.  Tongue does not appear enlarged.  No sublingual edema or fullness.  Eyes: Conjunctivae and EOM are normal. No scleral icterus.  Neck: Normal range of motion. Neck supple.   Cardiovascular: Normal rate, regular rhythm and normal heart sounds.  No murmur heard. 1+ radial and DP pulses bilaterally.  No lower extremity edema or calf tenderness.  Pulmonary/Chest: Effort normal and breath sounds normal. She has no decreased breath sounds. She has no wheezes.  Abdominal: Soft. There is no tenderness.  Musculoskeletal: Normal range of motion. She exhibits no deformity.  Neurological: She is alert and oriented to person, place, and time.  Skin: Skin is warm and dry. Capillary refill takes less than 2 seconds.  Psychiatric: She has a normal mood and affect. Her behavior is normal. Judgment and thought content normal.  Nursing note and vitals reviewed.    ED Treatments / Results  Labs (all labs ordered are listed, but only abnormal results are displayed) Labs Reviewed  BASIC METABOLIC PANEL - Abnormal; Notable for the following components:      Result Value   Glucose, Bld 152 (*)    All other components within normal limits  CBC - Abnormal; Notable for the following components:   WBC 11.4 (*)    RBC 5.19 (*)    Hemoglobin 15.6 (*)    HCT 48.2 (*)    All other components within normal limits  D-DIMER, QUANTITATIVE (NOT AT [facility]) - Abnormal; Notable for the following components:   D-Dimer, Quant 0.53 (*)    All other components within normal limits  I-STAT TROPONIN, ED  I-STAT BETA HCG BLOOD, ED ([facility], [facility], [facility] ONLY)  I-STAT TROPONIN, ED    EKG EKG Interpretation  Date/Time:  Tuesday July 13 2018 16:42:10 EDT Ventricular Rate:  107 PR Interval:  148 QRS Duration: 82 QT Interval:  324 QTC Calculation: 432 R Axis:   29 Text Interpretation:  Sinus tachycardia Anterior infarct , age undetermined Abnormal ECG No significant change since last tracing Confirmed by Merrily Pew [phone]) on 07/13/2018 10:29:10 PM   Radiology Dg Chest 2 View  Result Date: 07/13/2018 CLINICAL DATA:  Chest pain and shortness of Breath EXAM: CHEST - 2 VIEW COMPARISON:   06/13/2018 FINDINGS: Cardiac shadow is within normal limits. Pacing device is again seen and stable. The lungs are well aerated bilaterally. No focal infiltrate or sizable effusion is seen. No acute bony abnormality is noted. IMPRESSION: No acute abnormality noted. Electronically Signed   By: Inez Catalina M.D.   On: 07/13/2018 18:40   Ct Angio Chest Pe W And/or Wo Contrast  Result Date: 07/13/2018 CLINICAL DATA:  Chest pain and recent allergic reactions EXAM: CT ANGIOGRAPHY CHEST WITH CONTRAST TECHNIQUE: Multidetector CT imaging of the chest was performed using the standard protocol during bolus administration of intravenous contrast. Multiplanar CT image reconstructions and MIPs were obtained to evaluate the vascular anatomy. CONTRAST:  151mL ISOVUE-370 IOPAMIDOL (ISOVUE-370) INJECTION 76% COMPARISON:  07/13/18 FINDINGS: Cardiovascular: Thoracic aorta is well visualized and within normal limits. No significant atherosclerotic change is seen. Pulmonary artery demonstrates a normal branching pattern without evidence of intraluminal filling defect to suggest pulmonary embolism. The heart is at the upper limits of normal in size. No significant coronary calcifications are noted. Pacing device is noted. Mediastinum/Nodes: The thoracic inlet is within normal limits. No hilar or mediastinal adenopathy is noted. The esophagus as visualized is within normal limits. Lungs/Pleura: The lungs are well aerated bilaterally. No focal infiltrate or sizable effusion is seen. Mild dependent atelectatic changes are noted. No sizable parenchymal nodule is noted. Upper Abdomen: Changes of prior cholecystectomy are noted. The upper abdomen is otherwise within normal limits. Musculoskeletal: No acute bony abnormality is noted. Review of the MIP images confirms the above findings. IMPRESSION: No evidence of pulmonary emboli. Mild dependent atelectatic changes. No other focal abnormality is seen. Electronically Signed   By: Inez Catalina M.D.   On: 07/13/2018 21:14    Procedures Procedures (including critical care time)  Medications Ordered in ED Medications  iopamidol (ISOVUE-370) 76 % injection (has no administration in time range)  oxyCODONE-acetaminophen (PERCOCET/ROXICET) 5-325 MG per tablet 1 tablet (1 tablet Oral Given 07/13/18 1859)  ipratropium-albuterol (DUONEB) 0.5-2.5 (3) MG/3ML nebulizer solution 3 mL (3 mLs Nebulization Given 07/13/18 1859)  iopamidol (ISOVUE-370) 76 % injection 100 mL (100 mLs Intravenous Contrast Given 07/13/18 2052)     Initial Impression / Assessment and Plan / ED Course  I have reviewed the triage vital signs and the nursing notes.  Pertinent labs & imaging results that were available during my care of the patient were reviewed by me and considered in my medical decision making (see chart for  details).  Clinical Course as of Jul 13 2353  Tue Jul 13, 2018  1807 Went to evaluate patient, she is not in room.  Will return.   [CG]  1807 WBC(!): 11.4 [CG]  1808 Pulse Rate(!): 107 [CG]  1808 Pulse Rate(!): 102 [CG]  1808 BP(!): 166/115 [CG]  1808 BP(!): 166/84 [CG]  3845 F/u with PCP tomorrow    [CG]  3646 Had a LUE clot from Iv, no PE/DVT. No birth control. No recent travel, prolonged trip, surgery. NO calf pain or sweling.    [CG]    Clinical Course User Index [CG] Kinnie Feil, PA-C   37 year old with history of neurocardiogenic syncope with pacemaker is here for chest pain.    Chest pain constant since 3pm, central with radiation to the left shoulder.  Nonexertional, non-pleuritic.    Suspicion for PE is low however she is tachycardic on arrival and complaining of shortness of breath, she has had one left upper extremity superficial DVT after IV.  No other risk factors for PE or DVT however can't PERC her out.  We will obtain d-dimer to rule this out.  Her heart score is less than 3.  We will plan on delta troponin.  Patient has had recent, intermittent and  allergic type reaction since having flu shot.  Has had multiple flu shots in the past w/o similar reactions.  Has been on multiple rounds of steroids.  Facial rash, itchy throat onset yesterday. She was seen in ER and received IV Solu-Medrol.    2215: WBC 11.4 likely from recent steroids.  Hgb 15.6. Trop negative. D-dimer elevated.  Work up otherwise reassuring.  CTA to r/o PE pending.   2350: CTA is negative for PE.  Patient was given oxycodone and breathing treatment and chest pain/S OB improved from a 9 to a 7.  It has remained constant.  Delta troponin undetectable.  Given her low heart score, reassuring work-up, patient is considered safe for discharge with cardiology follow-up in the next 30 days as she is considered low risk for M ACE.  Discussed results with patient.  I explained that this is unlikely to be related to her recent intermittent allergic type symptoms.  Patient verbalized understanding.  She is comfortable with the ER work-up and disposition.  Discussed return precautions.  Patient is in agreement. She is to f/u with previously prescribed prednisone, zantac. She has epipen at home.  23Final Clinical Impressions(s) / ED Diagnoses   Final diagnoses:  Atypical chest pain    ED Discharge Orders    None       Arlean Hopping 07/13/18 2355    Mesner, Corene Cornea, MD 07/14/18 [phone]

## 2018-07-13 NOTE — Discharge Instructions (Signed)
You were evaluated in the emergency department for chest pain and SOB.  Work up today was reassuring.      Based on your risk factors, work up and exam you are considered low risk for major adverse cardiac events in the next 30 days.  This means you can be discharged with close follow up with cardiology for further outpatient work up in the next 30 days.  Call cardiology as soon as possible to establish care and further discussion and work up of your symptoms on an outpatient setting  Please return to ED if: Your chest pain is worse. You have a cough that gets worse, or you cough up blood. You have severe pain in your abdomen. You have severe weakness. You faint. You have sudden, unexplained chest discomfort. You have sudden, unexplained discomfort in your arms, back, neck, or jaw. You have shortness of breath at any time. You suddenly start to sweat, or your skin gets clammy. You feel nauseous or you vomit. You suddenly feel light-headed or dizzy. Your heart begins to beat quickly, or it feels like it is skipping beats.  Continue all medications prescribed to you last night

## 2018-07-13 NOTE — ED Notes (Signed)
Discharge instructions discussed with Pt. Pt verbalized understanding. Pt stable and ambulatory.    

## 2018-07-13 NOTE — ED Notes (Signed)
Pt given ginger ale and saltines.  

## 2018-07-13 NOTE — ED Notes (Signed)
Pt ambulatory to bathroom with no reported issues. 

## 2018-07-13 NOTE — ED Triage Notes (Signed)
Pt states that she has had several allergic reactions since receiving the flu shot in September. States that she began having chest pain today but it became constant around 3pm today. Pt has a significant cardiac hx and has biotronic pacemaker. Was seen at [facility] last night for anaphylaxis. States that the shortness of breath she is feeling currently is different than the shortness of breath she has been having with the allergic reaction.

## 2018-07-26 ENCOUNTER — Emergency Department (HOSPITAL_BASED_OUTPATIENT_CLINIC_OR_DEPARTMENT_OTHER)
Admission: EM | Admit: 2018-07-26 | Discharge: 2018-07-26 | Disposition: A | Discharge status: Home / Self Care | Payer: 59 | Attending: Emergency Medicine | Admitting: Emergency Medicine

## 2018-07-26 ENCOUNTER — Other Ambulatory Visit: Payer: Self-pay

## 2018-07-26 ENCOUNTER — Encounter (HOSPITAL_BASED_OUTPATIENT_CLINIC_OR_DEPARTMENT_OTHER): Payer: Self-pay | Admitting: Emergency Medicine

## 2018-07-26 ENCOUNTER — Emergency Department (HOSPITAL_BASED_OUTPATIENT_CLINIC_OR_DEPARTMENT_OTHER): Payer: 59

## 2018-07-26 DIAGNOSIS — M6281 Muscle weakness (generalized): Secondary | ICD-10-CM | POA: Diagnosis not present

## 2018-07-26 DIAGNOSIS — R197 Diarrhea, unspecified: Secondary | ICD-10-CM | POA: Insufficient documentation

## 2018-07-26 DIAGNOSIS — M79605 Pain in left leg: Secondary | ICD-10-CM | POA: Insufficient documentation

## 2018-07-26 DIAGNOSIS — I10 Essential (primary) hypertension: Secondary | ICD-10-CM | POA: Diagnosis not present

## 2018-07-26 DIAGNOSIS — R110 Nausea: Secondary | ICD-10-CM | POA: Diagnosis not present

## 2018-07-26 DIAGNOSIS — Z950 Presence of cardiac pacemaker: Secondary | ICD-10-CM | POA: Insufficient documentation

## 2018-07-26 DIAGNOSIS — Z79899 Other long term (current) drug therapy: Secondary | ICD-10-CM | POA: Insufficient documentation

## 2018-07-26 DIAGNOSIS — M79604 Pain in right leg: Secondary | ICD-10-CM | POA: Insufficient documentation

## 2018-07-26 DIAGNOSIS — R29898 Other symptoms and signs involving the musculoskeletal system: Secondary | ICD-10-CM

## 2018-07-26 LAB — CBC WITH DIFFERENTIAL/PLATELET
Abs Immature Granulocytes: 0.02 10*3/uL (ref 0.00–0.07)
Basophils Absolute: 0 10*3/uL (ref 0.0–0.1)
Basophils Relative: 0 %
EOS PCT: 1 %
Eosinophils Absolute: 0.1 10*3/uL (ref 0.0–0.5)
HEMATOCRIT: 44.7 % (ref 36.0–46.0)
Hemoglobin: 14.4 g/dL (ref 12.0–15.0)
Immature Granulocytes: 0 %
LYMPHS ABS: 3.2 10*3/uL (ref 0.7–4.0)
Lymphocytes Relative: 40 %
MCH: 30.3 pg (ref 26.0–34.0)
MCHC: 32.2 g/dL (ref 30.0–36.0)
MCV: 94.1 fL (ref 80.0–100.0)
MONOS PCT: 7 %
Monocytes Absolute: 0.5 10*3/uL (ref 0.1–1.0)
NRBC: 0 % (ref 0.0–0.2)
Neutro Abs: 4.1 10*3/uL (ref 1.7–7.7)
Neutrophils Relative %: 52 %
Platelets: 176 10*3/uL (ref 150–400)
RBC: 4.75 MIL/uL (ref 3.87–5.11)
RDW: 12.4 % (ref 11.5–15.5)
WBC: 7.9 10*3/uL (ref 4.0–10.5)

## 2018-07-26 LAB — URINALYSIS, ROUTINE W REFLEX MICROSCOPIC
BILIRUBIN URINE: NEGATIVE
Glucose, UA: NEGATIVE mg/dL
HGB URINE DIPSTICK: NEGATIVE
Ketones, ur: NEGATIVE mg/dL
Leukocytes, UA: NEGATIVE
Nitrite: NEGATIVE
Protein, ur: NEGATIVE mg/dL
SPECIFIC GRAVITY, URINE: 1.015 (ref 1.005–1.030)
pH: 6 (ref 5.0–8.0)

## 2018-07-26 LAB — COMPREHENSIVE METABOLIC PANEL
ALT: 17 U/L (ref 0–44)
ANION GAP: 8 (ref 5–15)
AST: 14 U/L — ABNORMAL LOW (ref 15–41)
Albumin: 3.3 g/dL — ABNORMAL LOW (ref 3.5–5.0)
Alkaline Phosphatase: 27 U/L — ABNORMAL LOW (ref 38–126)
BUN: 15 mg/dL (ref 6–20)
CO2: 28 mmol/L (ref 22–32)
Calcium: 8.5 mg/dL — ABNORMAL LOW (ref 8.9–10.3)
Chloride: 104 mmol/L (ref 98–111)
Creatinine, Ser: 0.83 mg/dL (ref 0.44–1.00)
Glucose, Bld: 102 mg/dL — ABNORMAL HIGH (ref 70–99)
POTASSIUM: 3.7 mmol/L (ref 3.5–5.1)
Sodium: 140 mmol/L (ref 135–145)
TOTAL PROTEIN: 5.7 g/dL — AB (ref 6.5–8.1)
Total Bilirubin: 0.7 mg/dL (ref 0.3–1.2)

## 2018-07-26 LAB — LIPASE, BLOOD: Lipase: 30 U/L (ref 11–51)

## 2018-07-26 LAB — TSH: TSH: 3.147 u[IU]/mL (ref 0.350–4.500)

## 2018-07-26 LAB — VITAMIN B12: VITAMIN B 12: 59 pg/mL — AB (ref 180–914)

## 2018-07-26 LAB — MAGNESIUM: MAGNESIUM: 2 mg/dL (ref 1.7–2.4)

## 2018-07-26 MED ORDER — OXYCODONE-ACETAMINOPHEN 5-325 MG PO TABS
1.0000 | ORAL_TABLET | Freq: Once | ORAL | Status: AC
  Administered 2018-07-26: 1 via ORAL
  Filled 2018-07-26: qty 1

## 2018-07-26 MED ORDER — SODIUM CHLORIDE 0.9 % IV BOLUS
1000.0000 mL | Freq: Once | INTRAVENOUS | Status: AC
  Administered 2018-07-26: 1000 mL via INTRAVENOUS

## 2018-07-26 MED ORDER — ONDANSETRON HCL 4 MG PO TABS: 4.0000 mg | ORAL_TABLET | Freq: Three times a day (TID) | ORAL | 0 refills | Status: DC | PRN

## 2018-07-26 MED ORDER — OXYCODONE-ACETAMINOPHEN 5-325 MG PO TABS: 1.0000 | ORAL_TABLET | ORAL | 0 refills | Status: DC | PRN

## 2018-07-26 MED ORDER — ONDANSETRON HCL 4 MG/2ML IJ SOLN
4.0000 mg | Freq: Once | INTRAMUSCULAR | Status: AC
  Administered 2018-07-26: 4 mg via INTRAVENOUS
  Filled 2018-07-26: qty 2

## 2018-07-26 MED FILL — CYANOCOBALAMIN 1,000 MCG/ML: 1000 | 7 days supply | Qty: 7 | Fill #0

## 2018-07-26 NOTE — ED Triage Notes (Signed)
Awakened at 3am with right calf pain.  Pain has spread to left and now goes above both knees, including the ankle and knee joints.  Also weakness in both legs.

## 2018-07-26 NOTE — ED Notes (Signed)
Patient transported to Ultrasound 

## 2018-07-26 NOTE — ED Notes (Signed)
ED Provider at bedside. 

## 2018-07-26 NOTE — ED Provider Notes (Signed)
[facility] Provider Note   CSN: [id number] Arrival date & time: 07/26/18  0705     History   Chief Complaint Chief Complaint  Patient presents with  . leg pain and weakness    HPI Erin Casey is a 37 y.o. female.  The history is provided by the patient and medical records. No language interpreter was used.  Extremity Weakness  This is a new problem. The current episode started 12 to 24 hours ago. The problem occurs constantly. The problem has been gradually worsening. Pertinent negatives include no chest pain, no abdominal pain, no headaches and no shortness of breath. Nothing aggravates the symptoms. Nothing relieves the symptoms. She has tried nothing for the symptoms. The treatment provided no relief.    Past Medical History:  Diagnosis Date  . Depletion of volume of extracellular fluid   . Essential hypertension 04/20/2013  . GERD (gastroesophageal reflux disease)   . Neurologic cardiac syncope   . 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 Heart palpitations 01/03/2016  . 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 Essential hypertension 04/20/2013  . 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 ABDOMINAL HYSTERECTOMY    . CESAREAN SECTION    . CHOLECYSTECTOMY    . hysterectomy    . PACEMAKER INSERTION    . PVC ABLATION N/A 03/12/2018   Procedure: PVC ABLATION;  Surgeon: Evans Lance, MD;  Location: [facility];  Service: Cardiovascular;  Laterality: N/A;  . TUBAL LIGATION       OB History   None      Home Medications    Prior to Admission medications   Medication Sig Start Date End Date Taking? Authorizing Provider  albuterol (PROVENTIL HFA;VENTOLIN HFA) 108 (90 Base) MCG/ACT inhaler Inhale into the lungs.    [provider]  cetirizine (ZYRTEC) 5 MG tablet Take 1 tablet (5 mg total) by mouth daily. Patient taking  differently: Take 10 mg by mouth 2 (two) times daily.  06/27/18   Khatri, Hina, PA-C  EPINEPHrine 0.3 mg/0.3 mL IJ SOAJ injection Inject 0.3 mLs (0.3 mg total) into the muscle as needed (anaphylaxis). 06/10/18   Mortis, Alvie Heidelberg I, PA-C  methylphenidate 54 MG PO CR tablet Take 1 tablet (54 mg total) by mouth every morning. 02/17/18   Deboraha Sprang, MD  pantoprazole (PROTONIX) 40 MG tablet Take 40 mg by mouth 2 (two) times daily.  02/03/16   [provider]  predniSONE (DELTASONE) 10 MG tablet Take 40 mg by mouth See admin instructions. Take 4 tablets by mouth daily for four days    [provider]  sennosides-docusate sodium (SENOKOT-S) 8.6-50 MG tablet Take 4 tablets by mouth at bedtime.    [provider]    Family History Family History  Problem Relation Age of Onset  . Diabetes Mother   . Hyperlipidemia Mother   . Hypertension Father   . Heart disease Father   . Hyperlipidemia Father   . Mental illness Sister   . Cancer Maternal Grandmother   . Hyperlipidemia Maternal Grandmother   . Heart disease Maternal Grandfather   . Hyperlipidemia Maternal Grandfather   . Hyperlipidemia Paternal Grandmother   . Hypertension Paternal Grandmother   . Heart disease Paternal Grandfather   . Hyperlipidemia Paternal Grandfather     Social History Social History   Tobacco Use  . Smoking status: Never Smoker  .  Smokeless tobacco: Never Used  Substance Use Topics  . Alcohol use: No  . Drug use: Never     Allergies   Flu virus vaccine; Sulfa antibiotics; Amoxicillin-pot clavulanate; and Sulfamethoxazole   Review of Systems Review of Systems  Constitutional: Negative for appetite change, chills, diaphoresis, fatigue and fever.  HENT: Negative for congestion.   Eyes: Negative for visual disturbance.  Respiratory: Negative for cough, choking, chest tightness, shortness of breath and wheezing.   Cardiovascular: Negative for chest pain, palpitations and leg  swelling.  Gastrointestinal: Negative for abdominal pain, constipation, diarrhea, nausea and vomiting.  Genitourinary: Negative for flank pain and frequency.  Musculoskeletal: Positive for extremity weakness. Negative for back pain, neck pain and neck stiffness.  Skin: Negative for rash and wound.  Neurological: Positive for weakness. Negative for dizziness, speech difficulty, light-headedness, numbness and headaches.  Psychiatric/Behavioral: Negative for agitation and confusion.  All other systems reviewed and are negative.    Physical Exam Updated Vital Signs BP 120/80 (BP Location: Right Arm)   Pulse 81   Temp 98.4 F (36.9 C) (Oral)   Resp 16   Ht 5\' 10"  (1.778 m)   Wt 118 kg   SpO2 98%   BMI 37.33 kg/m   Physical Exam  Constitutional: She is oriented to person, place, and time. She appears well-developed and well-nourished. No distress.  HENT:  Head: Normocephalic and atraumatic.  Nose: Nose normal.  Mouth/Throat: Oropharynx is clear and moist. No oropharyngeal exudate.  Eyes: Pupils are equal, round, and reactive to light. Conjunctivae and EOM are normal.  Neck: Normal range of motion. Neck supple.  Cardiovascular: Normal rate and regular rhythm.  No murmur heard. Pulmonary/Chest: Effort normal and breath sounds normal. No respiratory distress. She has no wheezes. She has no rales. She exhibits no tenderness.  Abdominal: Soft. There is no tenderness.  Musculoskeletal: She exhibits tenderness. She exhibits no edema.  Neurological: She is alert and oriented to person, place, and time. She displays abnormal reflex. No cranial nerve deficit or sensory deficit. She exhibits abnormal muscle tone. Coordination normal.  Mild weakness with leg raise in both legs.  Symmetric.  No clonus.  Decreased patellar reflexes bilaterally.  Normal pulse.  Normal sensation.  On reassessment, patient has stronger movement in legs and is able to stand and walk.  Reflexes still decreased but  strength is improved after pain is managed.  Other neurologic exam unremarkable.  Normal proprioception and vibration sensation.  Skin: Skin is warm and dry. Capillary refill takes less than 2 seconds. No rash noted. She is not diaphoretic. No erythema.  Psychiatric: She has a normal mood and affect.  Nursing note and vitals reviewed.    ED Treatments / Results  Labs (all labs ordered are listed, but only abnormal results are displayed) Labs Reviewed  COMPREHENSIVE METABOLIC PANEL - Abnormal; Notable for the following components:      Result Value   Glucose, Bld 102 (*)    Calcium 8.5 (*)    Total Protein 5.7 (*)    Albumin 3.3 (*)    AST 14 (*)    Alkaline Phosphatase 27 (*)    All other components within normal limits  VITAMIN B12 - Abnormal; Notable for the following components:   Vitamin B-12 59 (*)    All other components within normal limits  CBC WITH DIFFERENTIAL/PLATELET  LIPASE, BLOOD  MAGNESIUM  URINALYSIS, ROUTINE W REFLEX MICROSCOPIC  TSH    EKG None  Radiology US Venous Img Lower Bilateral  Result Date: 07/26/2018 CLINICAL DATA:  Bilateral lower extremity pain. Elevated D-dimer. History of upper extremity SVT. Evaluate for DVT. EXAM: BILATERAL LOWER EXTREMITY VENOUS DOPPLER ULTRASOUND TECHNIQUE: Gray-scale sonography with graded compression, as well as color Doppler and duplex ultrasound were performed to evaluate the lower extremity deep venous systems from the level of the common femoral vein and including the common femoral, femoral, profunda femoral, popliteal and calf veins including the posterior tibial, peroneal and gastrocnemius veins when visible. The superficial great saphenous vein was also interrogated. Spectral Doppler was utilized to evaluate flow at rest and with distal augmentation maneuvers in the common femoral, femoral and popliteal veins. COMPARISON:  None. FINDINGS: RIGHT LOWER EXTREMITY Common Femoral Vein: No evidence of thrombus. Normal  compressibility, respiratory phasicity and response to augmentation. Saphenofemoral Junction: No evidence of thrombus. Normal compressibility and flow on color Doppler imaging. Profunda Femoral Vein: No evidence of thrombus. Normal compressibility and flow on color Doppler imaging. Femoral Vein: No evidence of thrombus. Normal compressibility, respiratory phasicity and response to augmentation. Popliteal Vein: No evidence of thrombus. Normal compressibility, respiratory phasicity and response to augmentation. Calf Veins: No evidence of thrombus. Normal compressibility and flow on color Doppler imaging. Superficial Great Saphenous Vein: No evidence of thrombus. Normal compressibility. Venous Reflux:  None. Other Findings:  None. LEFT LOWER EXTREMITY Common Femoral Vein: No evidence of thrombus. Normal compressibility, respiratory phasicity and response to augmentation. Saphenofemoral Junction: No evidence of thrombus. Normal compressibility and flow on color Doppler imaging. Profunda Femoral Vein: No evidence of thrombus. Normal compressibility and flow on color Doppler imaging. Femoral Vein: No evidence of thrombus. Normal compressibility, respiratory phasicity and response to augmentation. Popliteal Vein: No evidence of thrombus. Normal compressibility, respiratory phasicity and response to augmentation. Calf Veins: No evidence of thrombus. Normal compressibility and flow on color Doppler imaging. Superficial Great Saphenous Vein: No evidence of thrombus. Normal compressibility. Venous Reflux:  None. Other Findings:  None. IMPRESSION: No evidence of DVT within either lower extremity. Electronically Signed   By: Sandi Mariscal M.D.   On: 07/26/2018 09:33    Procedures Procedures (including critical care time)  Medications Ordered in ED Medications  ondansetron (ZOFRAN) injection 4 mg (4 mg Intravenous Given 07/26/18 0832)  sodium chloride 0.9 % bolus 1,000 mL ( Intravenous Stopped 07/26/18 0946)    oxyCODONE-acetaminophen (PERCOCET/ROXICET) 5-325 MG per tablet 1 tablet (1 tablet Oral Given 07/26/18 0931)     Initial Impression / Assessment and Plan / ED Course  I have reviewed the triage vital signs and the nursing notes.  Pertinent labs & imaging results that were available during my care of the patient were reviewed by me and considered in my medical decision making (see chart for details).     AAIMA GADDIE is a 37 y.o. female with a past medical history significant for neurocardiogenic syncope status post pacemaker, hypertension, GERD, superficial thrombosis in the past, and recent allergic reactions who presents with bilateral lower extremity pain and sensation of weakness.  Patient reports that she has had for anaphylactic reactions this month and has been on near constant prednisone bursts.  She says that yesterday was her first day trying to taper down the prednisone.  She reports she began having diarrhea yesterday and into today.  She reports that she woke up around 3 AM with right calf pain, cramping, and sharp discomfort.  She reports that it then spread to her left lower leg and calf.  She reports she was feeling weak in both legs at  the ankle and knees.  She has never had this before.  She denies numbness or tingling.  She denies any back pain.  No recent injuries.  No current chest pain, shortness of breath, nausea, or vomiting.  She denies other complaints.  On exam, patient has subjective weakness with extension of the knee.  Patient does have symmetric plantarflexion and dorsiflexion of the ankle.  Patient has a weak patellar reflexes bilaterally however they are symmetric.  Normal sensation in the legs.  Patient has extreme tenderness in both calves with minimal touch.  She reports that clothing is extremely tender when it touches her.  Tenderness proceeds as she goes up the leg.  Hips are nontender.  Abdomen nontender.  Low back is nontender.  Lungs clear chest is nontender.   She has symmetric DP and PT pulses.  Normal cap refill.  Clinically given the patient's steroids changing and her history of a peripheral neurologic abnormalities, I am concerned that symptoms may related to her steroids changing versus diarrhea that she had.  We will check electrolytes kidney function.  We will also check a B12 and TSH.  Due to patient's reports she has an elevated d-dimer last week, will obtain ultrasound of both legs to look for DVT.  Low suspicion for this.  Had a discussion with patient as to further etiologies and work-up.  We discussed possibility of a Guyon Barr or other demyelinating issue.  Touch base with neurology who did not feel strongly that she needed MRI imaging today of her low back or lumbar puncture at this time.  They felt what she likely needs his EMG they could best be obtained at outpatient neurologic clinic if she does not need to be admitted.  Anticipate reassessment after work-up.  10:38 AM Patient reports her pain was slightly better after medications.  Ultrasound showed no evidence of DVTs.  Urinalysis reassuring.  TSH and B12 had not returned and will likely not return today due to send out.  Magnesium was normal.  Lipase normal.  Serum slightly low however she has a history of hypercalcemia.  Potassium normal.  CBC reassuring.  On reassessment, patient was able to ambulate and did not collapse.  Her leg strength appeared improved on my reexamination although she subjectively felt she was still weak.  Given her ability to ambulate and reassuring labs thus far, patient was felt stable for discharge and outpatient EMG/outpatient neurologic work-up for the subjective weakness and distal pains.  Patient will give prescription for pain medicine and nausea medicine to use for the discomfort.  Patient will return for any new or worsened symptoms and agree with plan of care.  We agreed not to pursue other imaging or LP at this time.    Patient had no other  questions or concerns and was discharged in good condition.  4:19 PM  After patient had been discharged, patient's labs continued to return showing a normal TSH but her B12 was low.  B12 was 59.  Spoke with neurology again who recommended a B12 injection today and have her take an IM injection of 1000 mics of B12 daily for the next week.  Patient will return to the ED to get the injection in the prescription.  Patient will continue her outpatient work-up with neurology.  Handwritten prescription was provided for the B12 injections.   Final Clinical Impressions(s) / ED Diagnoses   Final diagnoses:  Leg pain, bilateral  Bilateral leg weakness  Diarrhea, unspecified type  Nausea  ED Discharge Orders    None      Clinical Impression: 1. Leg pain, bilateral   2. Bilateral leg weakness   3. Diarrhea, unspecified type   4. Nausea     Disposition: Discharge  Condition: Good  I have discussed the results, Dx and Tx plan with the pt(& family if present). He/she/they expressed understanding and agree(s) with the plan. Discharge instructions discussed at great length. Strict return precautions discussed and pt &/or family have verbalized understanding of the instructions. No further questions at time of discharge.    New Prescriptions   No medications on file    Follow Up: [facility] [address]     [address] [place] [address] [phone] Schedule an appointment as soon as possible for a visit    [place] [address] [id number] mc [place] [phone]    Reita Cliche, MD          Annalynn Centanni, Gwenyth Allegra, MD 07/26/18 1620

## 2018-07-26 NOTE — Discharge Instructions (Signed)
Your ultrasound today showed no evidence of blood clots causing her leg pains.  Your labs were overall reassuring although the TSH and B12 had not returned.  Please check these on my chart.  Please follow-up with [facility] for further neurologic examination work-up.  Neurology team recommended outpatient EMG to further evaluate the nerves in your legs.  Please use the pain medicine nausea medicine to help with your discomfort and be careful not to fall.  Please stay hydrated.  I suspect some of your symptoms are worsened by dehydration with the diarrhea for the last 2 days.  Please follow-up.  If any symptoms change or worsen, please return to the nearest emergency department.

## 2018-07-28 ENCOUNTER — Encounter: Payer: Self-pay | Admitting: Neurology

## 2018-07-28 ENCOUNTER — Ambulatory Visit (INDEPENDENT_AMBULATORY_CARE_PROVIDER_SITE_OTHER): Payer: 59 | Admitting: Neurology

## 2018-07-28 ENCOUNTER — Other Ambulatory Visit: Payer: Self-pay

## 2018-07-28 VITALS — BP 139/84 | HR 97 | Resp 16 | Ht 70.0 in | Wt 260.0 lb

## 2018-07-28 DIAGNOSIS — E538 Deficiency of other specified B group vitamins: Secondary | ICD-10-CM | POA: Diagnosis not present

## 2018-07-28 DIAGNOSIS — R202 Paresthesia of skin: Secondary | ICD-10-CM

## 2018-07-28 HISTORY — DX: Deficiency of other specified B group vitamins: E53.8

## 2018-07-28 NOTE — Progress Notes (Signed)
Reason for visit: Leg paresthesias  Referring physician: [place]  Erin Casey is a 37 y.o. female  History of present illness:  Erin Casey is a 37 year old right-handed white female with a history of rapid onset of dysesthesias involving the lower extremities that occurred in the early morning hours of 26 July 2018.  The patient awoke around 3 AM with right calf discomfort with a burning sensation that was severe.  The patient was able to walk to the bathroom and back without too much difficulty.  She awoke again around 5 AM and noted pain and discomfort above the knees in the lower thighs going down both legs with severe discomfort and difficulty with walking, she had some sensation of weakness in the legs.  She denied any difficulty controlling the bowels or the bladder, she did not have any falls.  She went to the emergency room for an evaluation.  Blood work was done and showed a vitamin B12 level of 59.  The patient has begun vitamin B12 injections, she has already started to feel better with the lower extremity weakness and pain.  The patient still has some discomfort in the lower legs but the pain now comes and goes and is not there all the time.  The patient does sense some mild gait instability, she is able to walk much better however.  She denies any sensory alteration in the hands, she denies any neck pain, she does have some occasional low back pain.  She claims that 12 weeks ago she had a flu shot and developed an allergic response associated with anaphylaxis with difficulty breathing, she has been on steroids since that time, when she tries to come off the steroids the anaphylaxis returns.  She is being seen by an allergist at this point.  She does report occasional episodes of diarrhea, this is not a persistent issue.  Past Medical History:  Diagnosis Date  . Depletion of volume of extracellular fluid   . Essential hypertension 04/20/2013  . GERD (gastroesophageal reflux  disease)   . Neurocardiogenic syncope   . Neurologic cardiac syncope   . 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 ABDOMINAL HYSTERECTOMY    . CESAREAN SECTION    . CHOLECYSTECTOMY    . hysterectomy    . PACEMAKER INSERTION    . PVC ABLATION N/A 03/12/2018   Procedure: PVC ABLATION;  Surgeon: Evans Lance, MD;  Location: [facility];  Service: Cardiovascular;  Laterality: N/A;  . TUBAL LIGATION      Family History  Problem Relation Age of Onset  . Diabetes Mother   . Hyperlipidemia Mother   . Hypertension Father   . Heart disease Father   . Hyperlipidemia Father   . Mental illness Sister   . Cancer Maternal Grandmother   . Hyperlipidemia Maternal Grandmother   . Heart disease Maternal Grandfather   . Hyperlipidemia Maternal Grandfather   . Hyperlipidemia Paternal Grandmother   . Hypertension Paternal Grandmother   . Heart disease Paternal Grandfather   . Hyperlipidemia Paternal Grandfather     Social history:  reports that she has never smoked. She has never used smokeless tobacco. She reports that she does not drink alcohol or use drugs.  Medications:  Prior to Admission medications   Medication Sig Start Date End Date Taking? Authorizing Provider  albuterol (PROVENTIL HFA;VENTOLIN HFA) 108 (90 Base) MCG/ACT inhaler Inhale into the lungs.   Yes [provider]  cetirizine (ZYRTEC) 5 MG tablet Take 1 tablet (5 mg total) by mouth daily. Patient taking differently: Take 10 mg by mouth 2 (two) times daily.  06/27/18  Yes Khatri, Hina, PA-C  EPINEPHrine 0.3 mg/0.3 mL IJ SOAJ injection Inject 0.3 mLs (0.3 mg total) into the muscle as needed (anaphylaxis). 06/10/18  Yes Mortis, Jonelle Sports, PA-C  methylphenidate 54 MG PO CR tablet Take 1 tablet (54 mg total) by mouth every morning. 02/17/18  Yes Deboraha Sprang, MD  ondansetron (ZOFRAN) 4 MG tablet Take 1 tablet (4 mg total) by mouth every 8 (eight) hours as needed.  07/26/18  Yes Tegeler, Gwenyth Allegra, MD  oxyCODONE-acetaminophen (PERCOCET/ROXICET) 5-325 MG tablet Take 1 tablet by mouth every 4 (four) hours as needed for severe pain. 07/26/18  Yes Tegeler, Gwenyth Allegra, MD  pantoprazole (PROTONIX) 40 MG tablet Take 40 mg by mouth 2 (two) times daily.  02/03/16  Yes [provider]  predniSONE (DELTASONE) 10 MG tablet Take 40 mg by mouth See admin instructions. Take 4 tablets by mouth daily for four days   Yes [provider]  sennosides-docusate sodium (SENOKOT-S) 8.6-50 MG tablet Take 4 tablets by mouth at bedtime.   Yes [provider]      Allergies  Allergen Reactions  . Flu Virus Vaccine Anaphylaxis  . Sulfa Antibiotics Hives, Nausea Only and Rash  . Amoxicillin-Pot Clavulanate Other (See Comments)    [Derm] [GI]  . Sulfamethoxazole Nausea And Vomiting and Rash    ROS:  Out of a complete 14 system review of symptoms, the patient complains only of the following symptoms, and all other reviewed systems are negative.  Leg pain  There were no vitals taken for this visit.  Physical Exam  General: The patient is alert and cooperative at the time of the examination.  The patient is moderately to markedly obese.  Eyes: Pupils are equal, round, and reactive to light. Discs are flat bilaterally.  Neck: The neck is supple, no carotid bruits are noted.  Respiratory: The respiratory examination is clear.  Cardiovascular: The cardiovascular examination reveals a regular rate and rhythm, no obvious murmurs or rubs are noted.  Skin: Extremities are without significant edema.  Neurologic Exam  Mental status: The patient is alert and oriented x 3 at the time of the examination. The patient has apparent normal recent and remote memory, with an apparently normal attention span and concentration ability.  Cranial nerves: Facial symmetry is present. There is good sensation of the face to pinprick and soft touch bilaterally.  The strength of the facial muscles and the muscles to head turning and shoulder shrug are normal bilaterally. Speech is well enunciated, no aphasia or dysarthria is noted. Extraocular movements are full. Visual fields are full. The tongue is midline, and the patient has symmetric elevation of the soft palate. No obvious hearing deficits are noted.  Motor: The motor testing reveals 5 over 5 strength of all 4 extremities. Good symmetric motor tone is noted throughout.  Sensory: Sensory testing is intact to pinprick, soft touch, vibration sensation, and position sense on all 4 extremities, there may be a stocking pattern pinprick sensory deficit two thirds the way up the legs bilaterally. No evidence of extinction is noted.  Coordination: Cerebellar testing reveals good finger-nose-finger and heel-to-shin bilaterally.  Gait and station: Gait is normal. Tandem gait is minimally unsteady. Romberg is negative. No drift is seen.  Reflexes: Deep tendon reflexes are symmetric and normal bilaterally.  The patient has  well-maintained knee and ankle jerk reflexes bilaterally.  Toes are downgoing bilaterally.   Assessment/Plan:  1.  Vitamin B12 deficiency  2.  Dysesthesias, bilateral lower extremities  The patient had a severe vitamin B12 deficiency, she has developed a rapid onset of dysesthesias in the legs, she has been on vitamin B12 injections over the last 2 days and has already had some improvement in her symptoms that is significant.  The patient will continue her B12 therapy, she will follow-up in 6 weeks, she will contact our office if her condition worsens.  The patient has no evidence of Guillain-Barre syndrome, reflexes in the legs are well-maintained.  Jill Alexanders MD 07/28/2018 4:05 PM  [facility] [address] [place],  [address]  Phone [phone] Fax [phone]

## 2018-07-29 ENCOUNTER — Emergency Department (HOSPITAL_BASED_OUTPATIENT_CLINIC_OR_DEPARTMENT_OTHER)
Admission: EM | Admit: 2018-07-29 | Discharge: 2018-07-29 | Disposition: A | Discharge status: Home / Self Care | Payer: 59 | Attending: Emergency Medicine | Admitting: Emergency Medicine

## 2018-07-29 ENCOUNTER — Encounter (HOSPITAL_BASED_OUTPATIENT_CLINIC_OR_DEPARTMENT_OTHER): Payer: Self-pay | Admitting: Emergency Medicine

## 2018-07-29 ENCOUNTER — Telehealth: Payer: Self-pay | Admitting: Neurology

## 2018-07-29 ENCOUNTER — Other Ambulatory Visit: Payer: Self-pay

## 2018-07-29 DIAGNOSIS — R55 Syncope and collapse: Secondary | ICD-10-CM | POA: Diagnosis not present

## 2018-07-29 DIAGNOSIS — R202 Paresthesia of skin: Secondary | ICD-10-CM | POA: Diagnosis not present

## 2018-07-29 DIAGNOSIS — Z950 Presence of cardiac pacemaker: Secondary | ICD-10-CM | POA: Insufficient documentation

## 2018-07-29 DIAGNOSIS — I10 Essential (primary) hypertension: Secondary | ICD-10-CM | POA: Diagnosis not present

## 2018-07-29 DIAGNOSIS — E860 Dehydration: Secondary | ICD-10-CM | POA: Insufficient documentation

## 2018-07-29 DIAGNOSIS — Z79899 Other long term (current) drug therapy: Secondary | ICD-10-CM | POA: Diagnosis not present

## 2018-07-29 DIAGNOSIS — Z9049 Acquired absence of other specified parts of digestive tract: Secondary | ICD-10-CM | POA: Insufficient documentation

## 2018-07-29 LAB — CBC WITH DIFFERENTIAL/PLATELET
Abs Immature Granulocytes: 0.01 10*3/uL (ref 0.00–0.07)
BASOS ABS: 0 10*3/uL (ref 0.0–0.1)
Basophils Relative: 0 %
EOS ABS: 0.1 10*3/uL (ref 0.0–0.5)
EOS PCT: 2 %
HCT: 44.8 % (ref 36.0–46.0)
HEMOGLOBIN: 14.6 g/dL (ref 12.0–15.0)
Immature Granulocytes: 0 %
LYMPHS PCT: 43 %
Lymphs Abs: 2.4 10*3/uL (ref 0.7–4.0)
MCH: 30.4 pg (ref 26.0–34.0)
MCHC: 32.6 g/dL (ref 30.0–36.0)
MCV: 93.1 fL (ref 80.0–100.0)
Monocytes Absolute: 0.5 10*3/uL (ref 0.1–1.0)
Monocytes Relative: 9 %
NEUTROS PCT: 46 %
NRBC: 0 % (ref 0.0–0.2)
Neutro Abs: 2.5 10*3/uL (ref 1.7–7.7)
Platelets: 173 10*3/uL (ref 150–400)
RBC: 4.81 MIL/uL (ref 3.87–5.11)
RDW: 12.4 % (ref 11.5–15.5)
WBC: 5.5 10*3/uL (ref 4.0–10.5)

## 2018-07-29 LAB — URINALYSIS, ROUTINE W REFLEX MICROSCOPIC
Bilirubin Urine: NEGATIVE
Glucose, UA: NEGATIVE mg/dL
Hgb urine dipstick: NEGATIVE
Ketones, ur: 15 mg/dL — AB
LEUKOCYTES UA: NEGATIVE
NITRITE: NEGATIVE
PH: 6 (ref 5.0–8.0)
Protein, ur: NEGATIVE mg/dL
SPECIFIC GRAVITY, URINE: 1.025 (ref 1.005–1.030)

## 2018-07-29 LAB — COMPREHENSIVE METABOLIC PANEL
ALBUMIN: 3.3 g/dL — AB (ref 3.5–5.0)
ALT: 19 U/L (ref 0–44)
ANION GAP: 8 (ref 5–15)
AST: 17 U/L (ref 15–41)
Alkaline Phosphatase: 34 U/L — ABNORMAL LOW (ref 38–126)
BUN: 14 mg/dL (ref 6–20)
CO2: 25 mmol/L (ref 22–32)
Calcium: 8.5 mg/dL — ABNORMAL LOW (ref 8.9–10.3)
Chloride: 106 mmol/L (ref 98–111)
Creatinine, Ser: 0.77 mg/dL (ref 0.44–1.00)
GFR calc Af Amer: >60 mL/min (ref >60)
GFR calc non Af Amer: >60 mL/min (ref >60)
GLUCOSE: 115 mg/dL — AB (ref 70–99)
Potassium: 3.7 mmol/L (ref 3.5–5.1)
SODIUM: 139 mmol/L (ref 135–145)
Total Bilirubin: 0.5 mg/dL (ref 0.3–1.2)
Total Protein: 6.1 g/dL — ABNORMAL LOW (ref 6.5–8.1)

## 2018-07-29 LAB — PREGNANCY, URINE: Preg Test, Ur: NEGATIVE

## 2018-07-29 LAB — TROPONIN I: Troponin I: <0.03 ng/mL (ref <0.03)

## 2018-07-29 MED ORDER — SODIUM CHLORIDE 0.9 % IV BOLUS
1000.0000 mL | Freq: Once | INTRAVENOUS | Status: AC
  Administered 2018-07-29: 1000 mL via INTRAVENOUS

## 2018-07-29 NOTE — Telephone Encounter (Signed)
I called the patient, left a message, I will call back later. 

## 2018-07-29 NOTE — Telephone Encounter (Signed)
I called the patient again, left a message.  The patient apparently is in the emergency room at the moment, the patient presents with near syncope with increased heart rate.  If the patient does require my assistance, she is to call me back.

## 2018-07-29 NOTE — Telephone Encounter (Signed)
Pt has called to let Dr Jannifer Franklin know that she woke up with leg pain and her heart rate has not gone below 140.  Please call to advise

## 2018-07-29 NOTE — Discharge Instructions (Signed)
Stay hydrated.   Continue your B12 shot.   See your neurologist and primary care doctor  Return to ER if you have worse dizziness, passing out, numbness, weakness

## 2018-07-29 NOTE — ED Provider Notes (Signed)
[facility] EMERGENCY DEPARTMENT Provider Note   CSN: [id number] Arrival date & time: 07/29/18  1411     History   Chief Complaint Chief Complaint  Patient presents with  . Dizziness    HPI Erin Casey is a 37 y.o. female history of neurocardiogenic syncope, hypertension, B12 deficiency here presenting with near syncope.  Patient states that she felt lightheaded and dizzy like she is on pass out today.  She did not actually lose consciousness and did not have any chest pain previously.  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  Patient has some bilateral leg numbness and weakness.  She was evaluated 3 days ago in the ED and was diagnosed with B12 deficiency.  She is was started on daily B12 injections.  She also saw neurology yesterday for similar symptoms.  There was particular documentation that patient does not have Guillain-Barr.  Patient states that she is still able to walk.    The history is provided by the patient.    Past Medical History:  Diagnosis Date  . Depletion of volume of extracellular fluid   . Essential hypertension 04/20/2013  . GERD (gastroesophageal reflux disease)   . Neurocardiogenic syncope   . Neurologic cardiac syncope   . 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 Vitamin B12 deficiency 07/28/2018    Patient Active Problem List   Diagnosis Date Noted  . Vitamin B12 deficiency 07/28/2018  . Heart palpitations 01/03/2016  . 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 Essential hypertension 04/20/2013  . 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 ABDOMINAL HYSTERECTOMY    . CESAREAN SECTION    . CHOLECYSTECTOMY    . hysterectomy    . PACEMAKER INSERTION    . PVC ABLATION N/A 03/12/2018   Procedure: PVC ABLATION;  Surgeon: Evans Lance, MD;  Location: [facility];  Service: Cardiovascular;  Laterality: N/A;  . TUBAL LIGATION       OB History   None       Home Medications    Prior to Admission medications   Medication Sig Start Date End Date Taking? Authorizing Provider  albuterol (PROVENTIL HFA;VENTOLIN HFA) 108 (90 Base) MCG/ACT inhaler Inhale into the lungs.    [provider]  cetirizine (ZYRTEC) 5 MG tablet Take 1 tablet (5 mg total) by mouth daily. Patient taking differently: Take 10 mg by mouth 2 (two) times daily.  06/27/18   Khatri, Hina, PA-C  EPINEPHrine 0.3 mg/0.3 mL IJ SOAJ injection Inject 0.3 mLs (0.3 mg total) into the muscle as needed (anaphylaxis). 06/10/18   Mortis, Alvie Heidelberg I, PA-C  methylphenidate 54 MG PO CR tablet Take 1 tablet (54 mg total) by mouth every morning. 02/17/18   Deboraha Sprang, MD  ondansetron (ZOFRAN) 4 MG tablet Take 1 tablet (4 mg total) by mouth every 8 (eight) hours as needed. 07/26/18   Tegeler, Gwenyth Allegra, MD  oxyCODONE-acetaminophen (PERCOCET/ROXICET) 5-325 MG tablet Take 1 tablet by mouth every 4 (four) hours as needed for severe pain. 07/26/18   Tegeler, Gwenyth Allegra, MD  pantoprazole (PROTONIX) 40 MG tablet Take 40 mg by mouth 2 (two) times daily.  02/03/16   [provider]  predniSONE (DELTASONE) 10 MG tablet Take 40 mg by mouth See admin instructions. Take 4 tablets by mouth daily for four days    [provider]  sennosides-docusate sodium (SENOKOT-S) 8.6-50 MG tablet Take 4 tablets by mouth at  bedtime.    [provider]    Family History Family History  Problem Relation Age of Onset  . Diabetes Mother   . Hyperlipidemia Mother   . Hypertension Father   . Heart disease Father   . Hyperlipidemia Father   . Mental illness Sister   . Cancer Maternal Grandmother   . Hyperlipidemia Maternal Grandmother   . Heart disease Maternal Grandfather   . Hyperlipidemia Maternal Grandfather   . Hyperlipidemia Paternal Grandmother   . Hypertension Paternal Grandmother   . Heart disease Paternal Grandfather   . Hyperlipidemia Paternal Grandfather      Social History Social History   Tobacco Use  . Smoking status: Never Smoker  . Smokeless tobacco: Never Used  Substance Use Topics  . Alcohol use: No  . Drug use: Never     Allergies   Flu virus vaccine; Sulfa antibiotics; Amoxicillin-pot clavulanate; and Sulfamethoxazole   Review of Systems Review of Systems  Neurological: Positive for dizziness.  All other systems reviewed and are negative.    Physical Exam Updated Vital Signs BP 119/79 (BP Location: Right Arm)   Pulse 91   Temp 98 F (36.7 C) (Oral)   Resp 20   Ht 5' 10.5" (1.791 m)   Wt 116.6 kg   SpO2 99%   BMI 36.35 kg/m   Physical Exam  Constitutional: She is oriented to person, place, and time. She appears well-developed.  HENT:  Head: Normocephalic.  Mouth/Throat: Oropharynx is clear and moist.  Eyes: Pupils are equal, round, and reactive to light. Conjunctivae and EOM are normal.  Neck: Normal range of motion. Neck supple.  Cardiovascular: Normal rate, regular rhythm and normal heart sounds.  Pulmonary/Chest: Effort normal and breath sounds normal. No stridor. No respiratory distress. She has no wheezes.  Abdominal: Soft. Bowel sounds are normal. She exhibits no distension. There is no tenderness.  Musculoskeletal: Normal range of motion.  Neurological: She is alert and oriented to person, place, and time. No cranial nerve deficit. Coordination normal.  CN 2- 12 intact. Strength nl bilaterally. Slightly dec sensation bilateral legs but no obvious saddle anesthesia. Nl reflexes bilaterally.   Skin: Skin is warm.  Psychiatric: She has a normal mood and affect.  Nursing note and vitals reviewed.    ED Treatments / Results  Labs (all labs ordered are listed, but only abnormal results are displayed) Labs Reviewed  COMPREHENSIVE METABOLIC PANEL - Abnormal; Notable for the following components:      Result Value   Glucose, Bld 115 (*)    Calcium 8.5 (*)    Total Protein 6.1 (*)    Albumin 3.3  (*)    Alkaline Phosphatase 34 (*)    All other components within normal limits  URINALYSIS, ROUTINE W REFLEX MICROSCOPIC - Abnormal; Notable for the following components:   Ketones, ur 15 (*)    All other components within normal limits  CBC WITH DIFFERENTIAL/PLATELET  TROPONIN I  PREGNANCY, URINE    EKG None  Radiology No results found.  Procedures Procedures (including critical care time)  Medications Ordered in ED Medications  sodium chloride 0.9 % bolus 1,000 mL (1,000 mLs Intravenous New Bag/Given 07/29/18 1551)     Initial Impression / Assessment and Plan / ED Course  I have reviewed the triage vital signs and the nursing notes.  Pertinent labs & imaging results that were available during my care of the patient were reviewed by me and considered in my medical decision making (see chart for  details).     Erin Casey is a 37 y.o. female here presenting with bilateral leg numbness and near syncope.  She has long-standing cardiogenic syncope and usually improves with IV fluids.  Patient has this numbness of bilateral lower extremities and saw neurology yesterday and Guillain-Barr was considered but was ruled out due to normal reflexes.  She still has normal reflexes today and per the neurology note, the numbness is likely secondary to the B12 deficiency for which she is getting daily injections for.  I agreed that her paresthesias are likely not from [place] and likely B12 deficiency. Will order basic blood work as well as Office manager and give some IV fluids.  4:41 PM Labs unremarkable. UA showed some ketones. Not orthostatic. Felt better after IVF. Stable for discharge.    Final Clinical Impressions(s) / ED Diagnoses   Final diagnoses:  None    ED Discharge Orders    None       Drenda Freeze, MD 07/29/18 [phone]

## 2018-07-29 NOTE — ED Triage Notes (Signed)
Patient states that she was here on Monday for similar feelings - reports that her B12 was low and she is now taking B12  -reports that she feels dizzy and she feels like she is going to pass out  - patient reports bilateral lower leg pain as well

## 2018-07-29 NOTE — Telephone Encounter (Signed)
Spoke with pt. She reports continued fast HR. Sts. she sometimes feels she is going to pass out when when she is standing. She has a hx. of neurocardiogenic syncope and sts. has head these sx. before, does not feel she needs immed. treatment in the ER. sts. pain in both legs is worse and interfered with sleep last night. Will update Dr. Anabel Bene

## 2018-08-19 ENCOUNTER — Ambulatory Visit (INDEPENDENT_AMBULATORY_CARE_PROVIDER_SITE_OTHER): Payer: 59

## 2018-08-19 DIAGNOSIS — R55 Syncope and collapse: Secondary | ICD-10-CM

## 2018-08-19 NOTE — Progress Notes (Signed)
Remote pacemaker transmission.   

## 2018-08-20 ENCOUNTER — Encounter: Payer: Self-pay | Admitting: Cardiology

## 2018-08-31 ENCOUNTER — Ambulatory Visit: Payer: 59 | Admitting: Neurology

## 2018-09-09 ENCOUNTER — Other Ambulatory Visit: Payer: Self-pay

## 2018-09-09 NOTE — Telephone Encounter (Signed)
Please advised on refill request.

## 2018-10-05 ENCOUNTER — Ambulatory Visit: Payer: 59 | Admitting: Neurology

## 2018-10-15 LAB — CUP PACEART REMOTE DEVICE CHECK
Implantable Lead Implant Date: 20130708
Implantable Lead Model: 350
Implantable Lead Serial Number: 29222139
Implantable Lead Serial Number: 29231638
Implantable Pulse Generator Implant Date: 20130708
MDC IDC LEAD IMPLANT DT: 20130708
MDC IDC LEAD LOCATION: 753859
MDC IDC LEAD LOCATION: 753860
MDC IDC PG SERIAL: 66303845
MDC IDC SESS DTM: 20200117083817

## 2018-11-17 ENCOUNTER — Telehealth: Payer: Self-pay | Admitting: Physician Assistant

## 2018-11-17 ENCOUNTER — Other Ambulatory Visit: Payer: Self-pay | Admitting: Family Medicine

## 2018-11-17 ENCOUNTER — Other Ambulatory Visit (HOSPITAL_COMMUNITY): Payer: Self-pay | Admitting: Family Medicine

## 2018-11-17 DIAGNOSIS — K3184 Gastroparesis: Secondary | ICD-10-CM

## 2018-11-17 NOTE — Telephone Encounter (Signed)
Paged concerning interaction between [employer] system and her biotronic PPM. She experienced some chest tightness today and called the company who advised her that the device has not been studied in patients with a PPM in place. She immediately took off the device. She then called biotronic who told her that there was no research between the two devices. She has continued to have symptoms of chest tightness after disposing of the [place]. But she also states that she has days in which she experiences these symptoms.  I contacted Tommye Standard PA in EP who relayed that it was unlikely the [place] permanently interfered with her device. I relayed that the safest thing would be for her to be evaluated in the ER. She is unsure if her symptoms are her "normal."  I told the patient that if she continued to have symptoms that were out of the ordinary for her, she should come to the ER for evaluation and interrogation. Otherwise, she will call the device clinic tomorrow for an appt. She expressed understanding of the plan.

## 2018-11-18 ENCOUNTER — Ambulatory Visit (INDEPENDENT_AMBULATORY_CARE_PROVIDER_SITE_OTHER): Payer: 59

## 2018-11-18 DIAGNOSIS — I471 Supraventricular tachycardia: Secondary | ICD-10-CM

## 2018-11-18 DIAGNOSIS — R55 Syncope and collapse: Secondary | ICD-10-CM

## 2018-11-20 LAB — CUP PACEART REMOTE DEVICE CHECK
Date Time Interrogation Session: 20200222072258
Implantable Lead Implant Date: 20130708
Implantable Lead Implant Date: 20130708
Implantable Lead Location: 753860
Implantable Lead Model: 350
Implantable Lead Serial Number: 29231638
Implantable Pulse Generator Implant Date: 20130708
MDC IDC LEAD LOCATION: 753859
MDC IDC LEAD SERIAL: 29222139
Pulse Gen Serial Number: 66303845

## 2018-11-25 NOTE — Progress Notes (Signed)
Remote pacemaker transmission.   

## 2018-11-26 ENCOUNTER — Encounter: Payer: Self-pay | Admitting: Cardiology

## 2018-11-30 ENCOUNTER — Ambulatory Visit (HOSPITAL_COMMUNITY): Payer: 59

## 2018-11-30 ENCOUNTER — Encounter (HOSPITAL_COMMUNITY): Payer: Self-pay

## 2019-01-11 ENCOUNTER — Other Ambulatory Visit: Payer: Self-pay | Admitting: Internal Medicine

## 2019-01-11 ENCOUNTER — Other Ambulatory Visit: Payer: Self-pay

## 2019-01-11 MED ORDER — METHYLPHENIDATE HCL ER (OSM) 54 MG PO TBCR: 54.0000 mg | EXTENDED_RELEASE_TABLET | ORAL | 0 refills | Status: AC

## 2019-02-17 ENCOUNTER — Ambulatory Visit (INDEPENDENT_AMBULATORY_CARE_PROVIDER_SITE_OTHER): Payer: 59 | Admitting: *Deleted

## 2019-02-17 DIAGNOSIS — R55 Syncope and collapse: Secondary | ICD-10-CM | POA: Diagnosis not present

## 2019-02-17 LAB — CUP PACEART REMOTE DEVICE CHECK
Battery Remaining Percentage: 55 %
Brady Statistic RA Percent Paced: 56 %
Brady Statistic RV Percent Paced: 1 %
Date Time Interrogation Session: 20200521100109
Implantable Lead Implant Date: 20130708
Implantable Lead Implant Date: 20130708
Implantable Lead Location: 753859
Implantable Lead Location: 753860
Implantable Lead Model: 350
Implantable Lead Model: 350
Implantable Lead Serial Number: 29222139
Implantable Lead Serial Number: 29231638
Implantable Pulse Generator Implant Date: 20130708
Lead Channel Impedance Value: 507 Ohm
Lead Channel Impedance Value: 527 Ohm
Lead Channel Pacing Threshold Amplitude: 1.1 V
Lead Channel Pacing Threshold Pulse Width: 0.4 ms
Lead Channel Sensing Intrinsic Amplitude: 14.1 mV
Lead Channel Sensing Intrinsic Amplitude: 3.9 mV
Lead Channel Setting Pacing Amplitude: 2 V
Lead Channel Setting Pacing Amplitude: 2.4 V
Lead Channel Setting Pacing Pulse Width: 0.4 ms
Pulse Gen Serial Number: 66303845

## 2019-02-19 IMAGING — CR DG CHEST 2V
2 series · 2 of 2 positions shown · non-contrast
Comparison: 06/13/2018

CLINICAL DATA: Chest pain and shortness of Breath

EXAM:
CHEST - 2 VIEW

[chest pa]
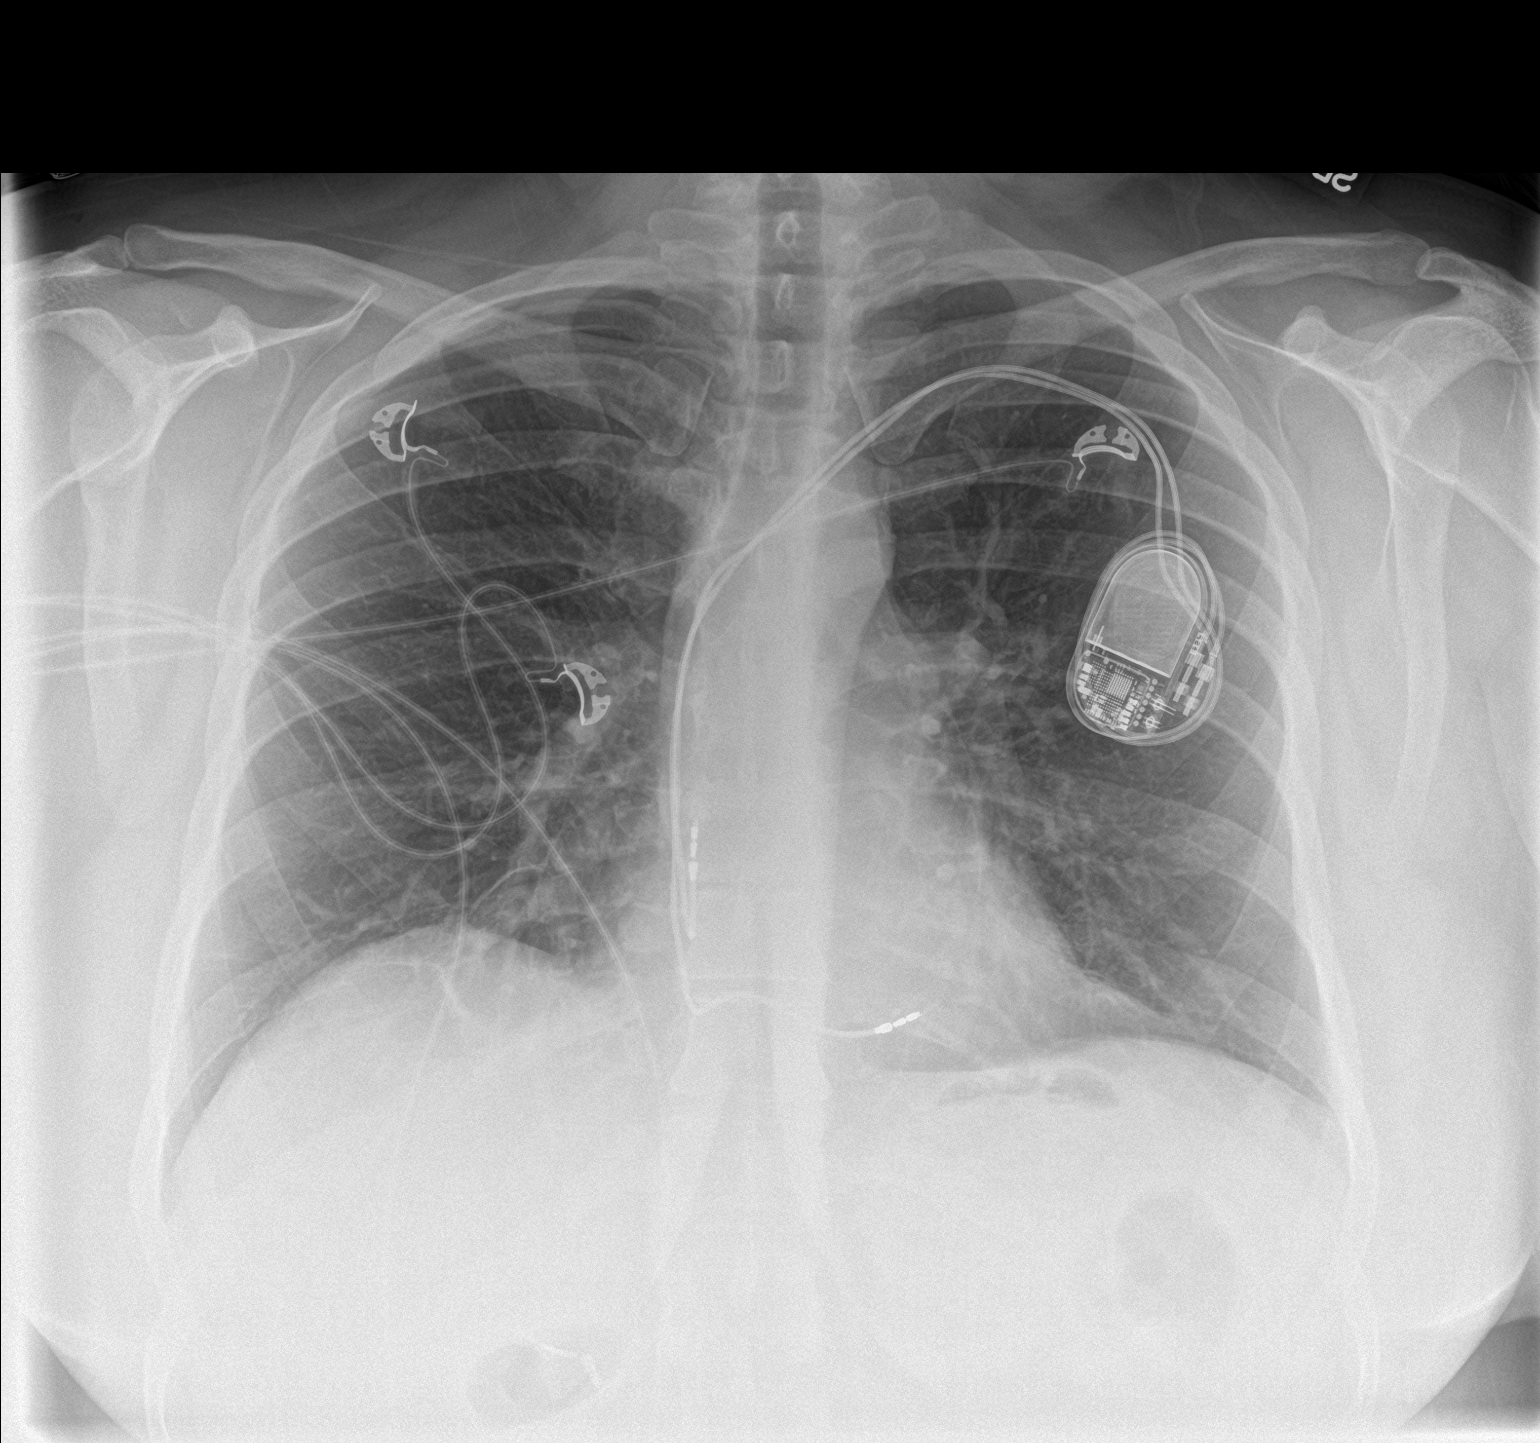

[chest lat]
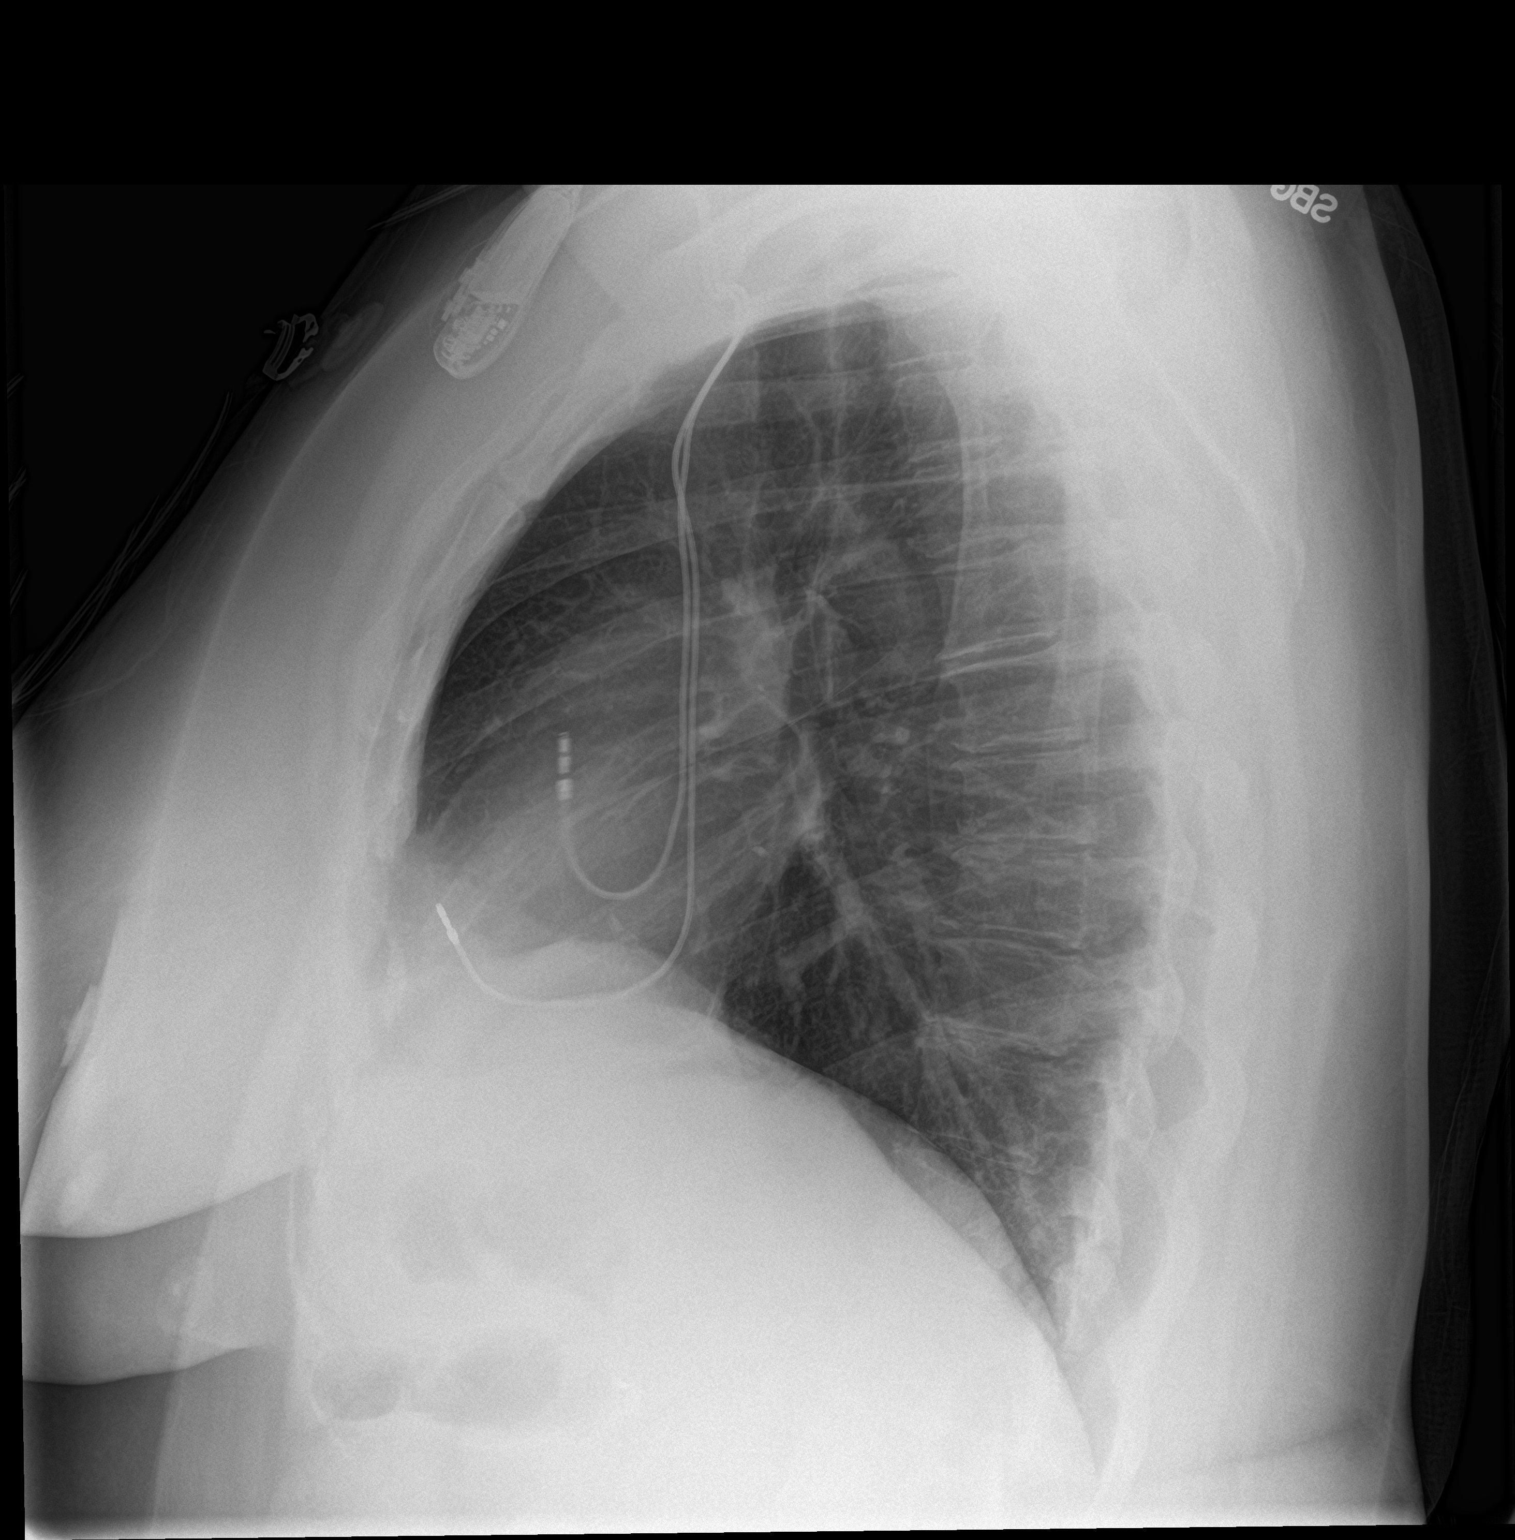

[2 of 2 positions shown; findings below may reference images not displayed]

FINDINGS: Cardiac shadow is within normal limits. Pacing device is again seen
and stable. The lungs are well aerated bilaterally. No focal
infiltrate or sizable effusion is seen. No acute bony abnormality is
noted.
IMPRESSION: No acute abnormality noted.

## 2019-02-19 IMAGING — CT CT ANGIO CHEST
2 of 6 series · 18 of 36 positions shown · IV contrast (iopamidol)
Comparison: 07/13/18

CLINICAL DATA: Chest pain and recent allergic reactions

EXAM:
CT ANGIOGRAPHY CHEST WITH CONTRAST
TECHNIQUE: Multidetector CT imaging of the chest was performed using the
standard protocol during bolus administration of intravenous
contrast. Multiplanar CT image reconstructions and MIPs were
obtained to evaluate the vascular anatomy.
CONTRAST:  100mL EJ1G0R-VOZ IOPAMIDOL (EJ1G0R-VOZ) INJECTION 76%

[Series 7: pe thins · axial · 0.80mm/px · z∈[+1096,+1305]mm · 17 of 332 slices shown]
[im 17/332  lung]
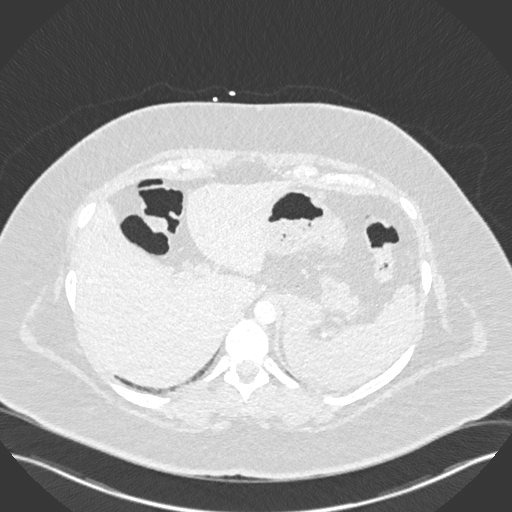
[im 34/332  mediastinal]
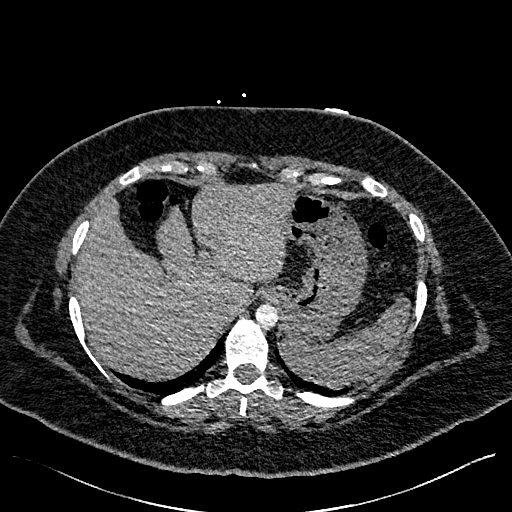
[im 50/332  lung]
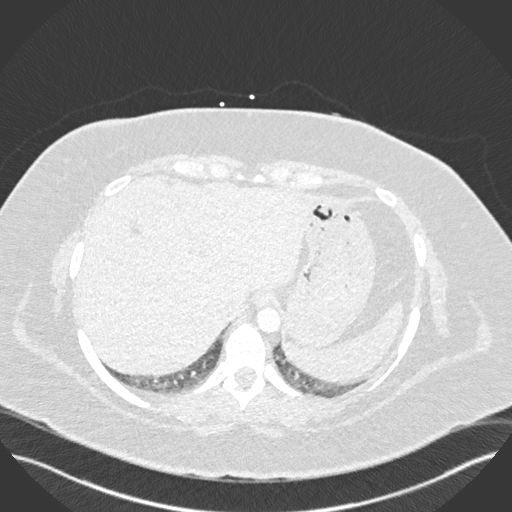
[im 67/332  mediastinal]
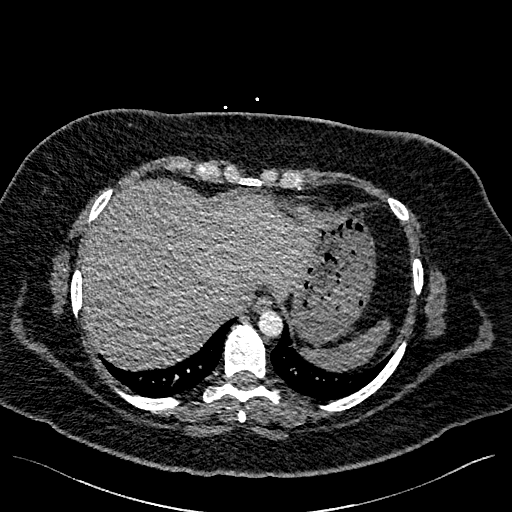
[im 100/332  lung]
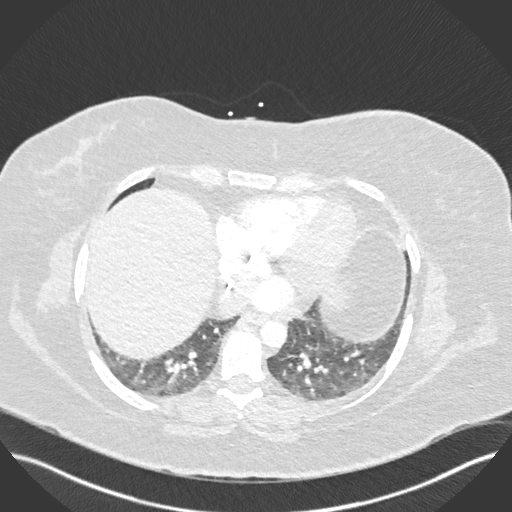
[im 116/332  mediastinal]
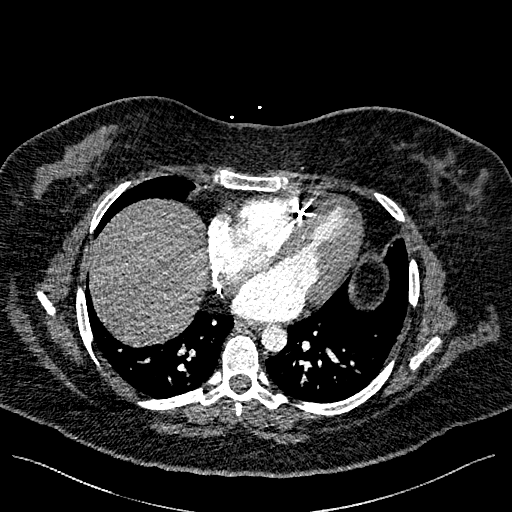
[im 133/332  lung]
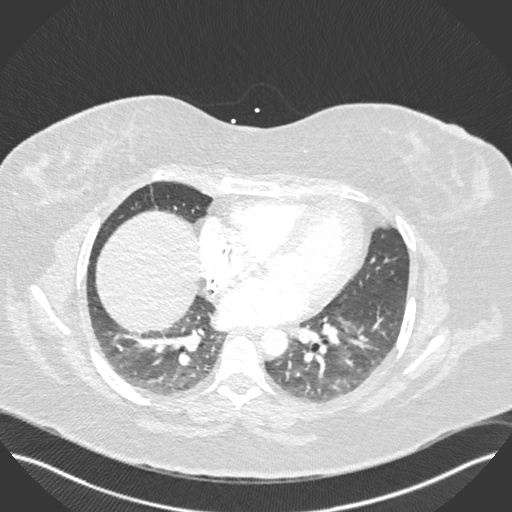
[im 149/332  mediastinal]
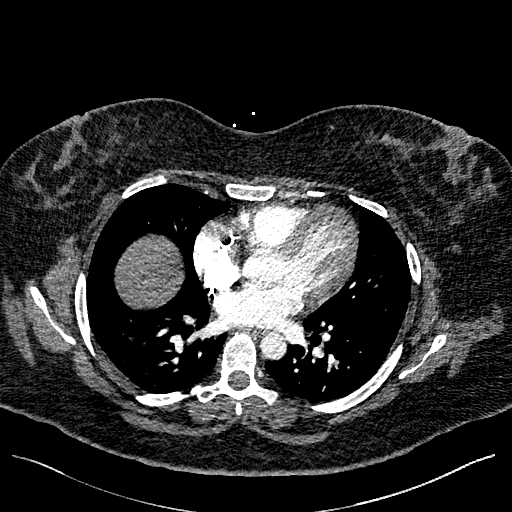
[im 166/332  lung]
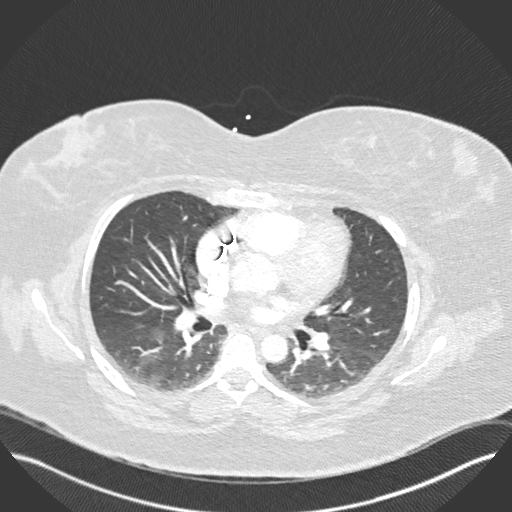
[im 183/332  mediastinal]
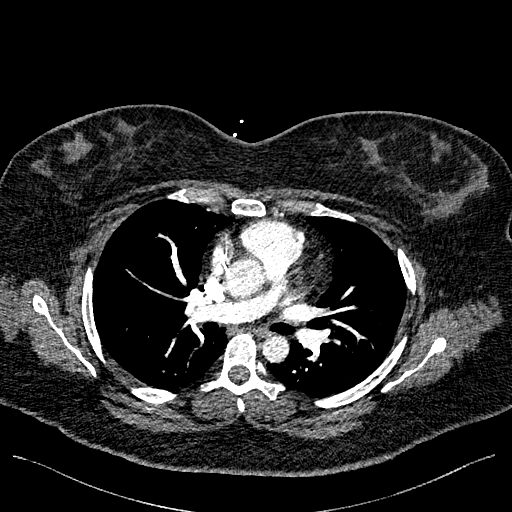
[im 199/332  lung]
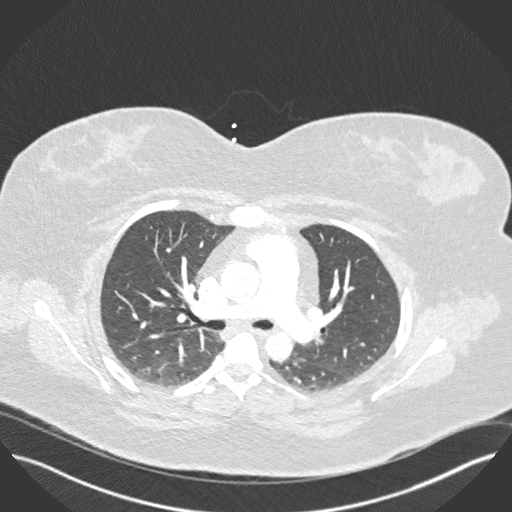
[im 216/332  mediastinal]
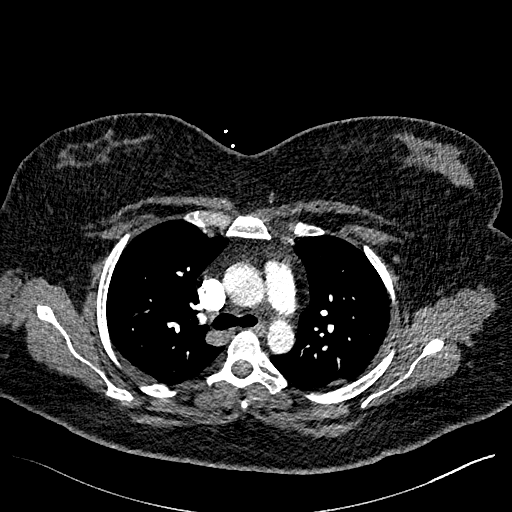
[im 232/332  lung]
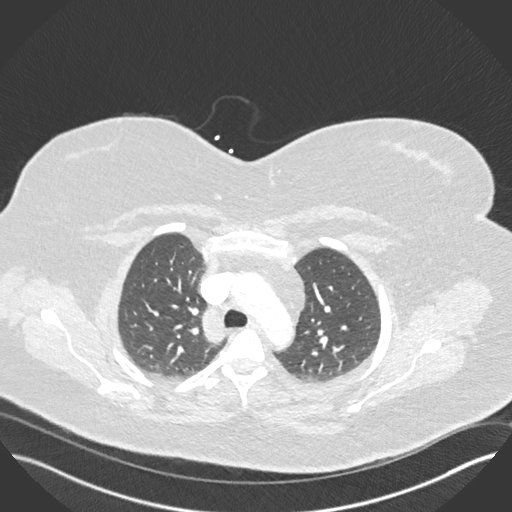
[im 265/332  mediastinal]
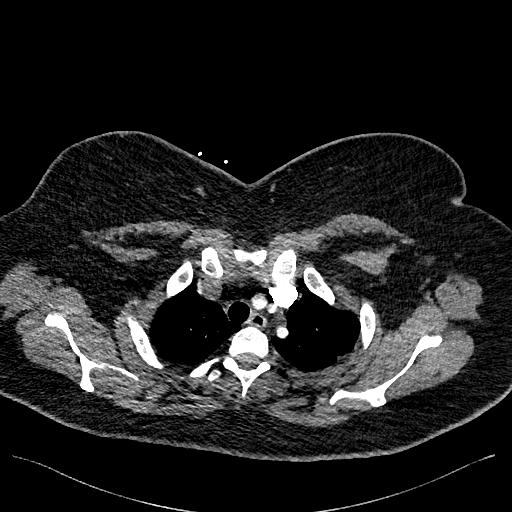
[im 282/332  lung]
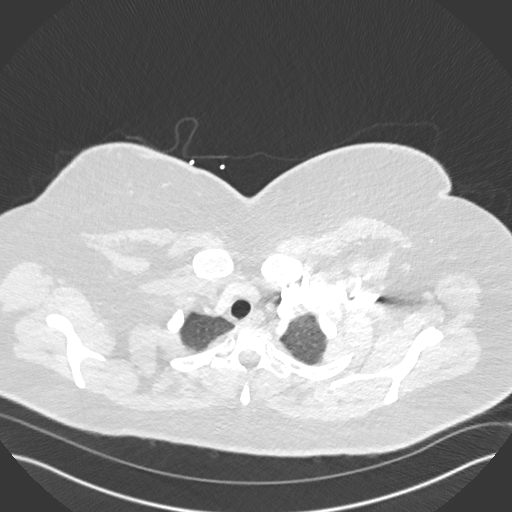
[im 298/332  mediastinal]
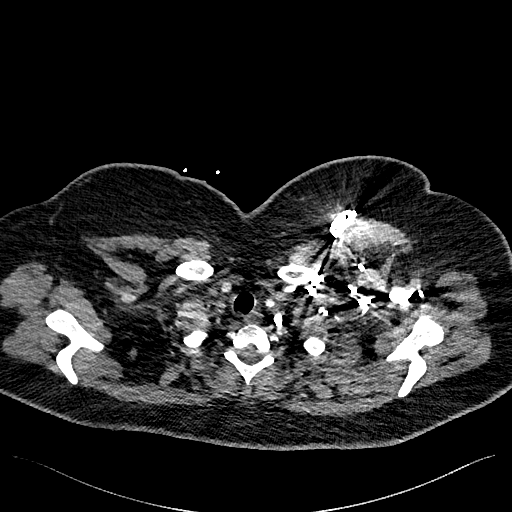
[im 315/332  lung]
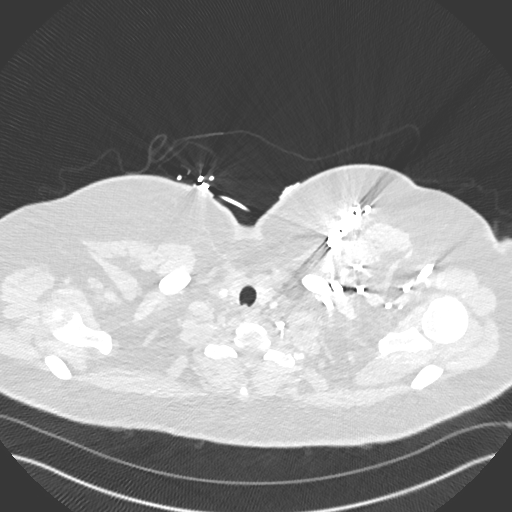

[Series 8: pe 2mm cor · coronal · 0.48mm/px · 1 of 160 slices shown]
[im 80/160  mediastinal]
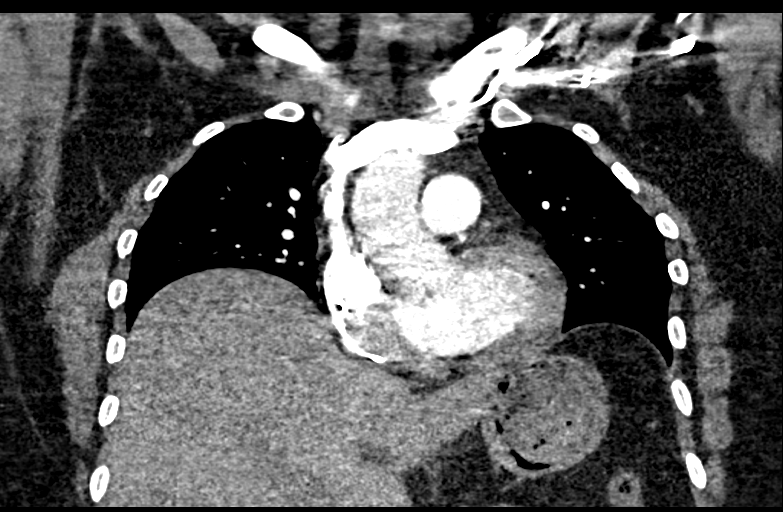

[18 of 36 positions shown; findings below may reference images not displayed]

FINDINGS: Cardiovascular: Thoracic aorta is well visualized and within normal
limits. No significant atherosclerotic change is seen. Pulmonary
artery demonstrates a normal branching pattern without evidence of
intraluminal filling defect to suggest pulmonary embolism. The heart
is at the upper limits of normal in size. No significant coronary
calcifications are noted. Pacing device is noted.

Mediastinum/Nodes: The thoracic inlet is within normal limits. No
hilar or mediastinal adenopathy is noted. The esophagus as
visualized is within normal limits.

Lungs/Pleura: The lungs are well aerated bilaterally. No focal
infiltrate or sizable effusion is seen. Mild dependent atelectatic
changes are noted. No sizable parenchymal nodule is noted.

Upper Abdomen: Changes of prior cholecystectomy are noted. The upper
abdomen is otherwise within normal limits.

Musculoskeletal: No acute bony abnormality is noted.

Review of the MIP images confirms the above findings.
IMPRESSION: No evidence of pulmonary emboli.

Mild dependent atelectatic changes. No other focal abnormality is
seen.

## 2019-02-26 ENCOUNTER — Other Ambulatory Visit: Payer: Self-pay

## 2019-02-28 ENCOUNTER — Encounter: Payer: Self-pay | Admitting: Cardiology

## 2019-02-28 NOTE — Progress Notes (Signed)
Remote pacemaker transmission.   

## 2019-03-29 ENCOUNTER — Encounter: Payer: Self-pay | Admitting: Neurology

## 2019-03-29 ENCOUNTER — Other Ambulatory Visit: Payer: Self-pay

## 2019-03-29 DIAGNOSIS — G629 Polyneuropathy, unspecified: Secondary | ICD-10-CM

## 2019-04-28 ENCOUNTER — Encounter: Payer: 59 | Admitting: Neurology

## 2019-05-19 ENCOUNTER — Ambulatory Visit (INDEPENDENT_AMBULATORY_CARE_PROVIDER_SITE_OTHER): Payer: 59 | Admitting: *Deleted

## 2019-05-19 DIAGNOSIS — R55 Syncope and collapse: Secondary | ICD-10-CM | POA: Diagnosis not present

## 2019-05-19 LAB — CUP PACEART REMOTE DEVICE CHECK
Battery Remaining Percentage: 55 %
Brady Statistic RA Percent Paced: 56 %
Brady Statistic RV Percent Paced: 1 %
Date Time Interrogation Session: 20200820124142
Implantable Lead Implant Date: 20130708
Implantable Lead Implant Date: 20130708
Implantable Lead Location: 753859
Implantable Lead Location: 753860
Implantable Lead Model: 350
Implantable Lead Model: 350
Implantable Lead Serial Number: 29222139
Implantable Lead Serial Number: 29231638
Implantable Pulse Generator Implant Date: 20130708
Lead Channel Impedance Value: 507 Ohm
Lead Channel Impedance Value: 507 Ohm
Lead Channel Pacing Threshold Amplitude: 0.6 V
Lead Channel Pacing Threshold Amplitude: 1 V
Lead Channel Pacing Threshold Pulse Width: 0.4 ms
Lead Channel Pacing Threshold Pulse Width: 0.4 ms
Lead Channel Sensing Intrinsic Amplitude: 12.5 mV
Lead Channel Sensing Intrinsic Amplitude: 3.8 mV
Lead Channel Setting Pacing Amplitude: 2 V
Lead Channel Setting Pacing Amplitude: 2.4 V
Lead Channel Setting Pacing Pulse Width: 0.4 ms
Pulse Gen Serial Number: 66303845

## 2019-05-27 NOTE — Progress Notes (Signed)
Remote pacemaker transmission.   

## 2019-06-15 ENCOUNTER — Other Ambulatory Visit: Payer: Self-pay | Admitting: Family Medicine

## 2019-06-15 ENCOUNTER — Other Ambulatory Visit (HOSPITAL_COMMUNITY): Payer: Self-pay | Admitting: Family Medicine

## 2019-06-15 DIAGNOSIS — K3184 Gastroparesis: Secondary | ICD-10-CM

## 2019-06-22 DIAGNOSIS — M79641 Pain in right hand: Secondary | ICD-10-CM | POA: Insufficient documentation

## 2019-06-22 DIAGNOSIS — G5621 Lesion of ulnar nerve, right upper limb: Secondary | ICD-10-CM | POA: Insufficient documentation

## 2019-06-24 ENCOUNTER — Encounter (HOSPITAL_COMMUNITY)
Admission: RE | Admit: 2019-06-24 | Discharge: 2019-06-24 | Disposition: A | Discharge status: Home / Self Care | Payer: 59 | Source: Ambulatory Visit | Attending: Family Medicine | Admitting: Family Medicine

## 2019-06-24 ENCOUNTER — Other Ambulatory Visit: Payer: Self-pay

## 2019-06-24 DIAGNOSIS — K3184 Gastroparesis: Secondary | ICD-10-CM | POA: Insufficient documentation

## 2019-06-24 MED ORDER — TECHNETIUM TC 99M SULFUR COLLOID
2.0300 | Freq: Once | INTRAVENOUS | Status: AC | PRN
  Administered 2019-06-24: 2.03 via ORAL

## 2019-06-24 MED ORDER — TECHNETIUM TC 99M SULFUR COLLOID: 2.0300 | Freq: Once | INTRAVENOUS | Status: DC | PRN

## 2019-07-12 ENCOUNTER — Telehealth: Payer: Self-pay | Admitting: *Deleted

## 2019-07-12 NOTE — Telephone Encounter (Signed)
   [facility] Pre-operative Risk Assessment    Request for surgical clearance:  1. What type of surgery is being performed? RIGHT CTR. CUBITAL TUNNEL RELEASE   2. When is this surgery scheduled? 07/28/19   3. What type of clearance is required (medical clearance vs. Pharmacy clearance to hold med vs. Both)? MEDICAL  4. Are there any medications that need to be held prior to surgery and how long? NONE LISTED   5. Practice name and name of physician performing surgery? [facility]; DR. Jeneen Rinks [place]   6. What is your office phone number [phone]    7.   What is your office fax number [phone]  8.   Anesthesia type (None, local, MAC, general) ? MAC   Erin Casey 07/12/2019, 3:51 PM  _________________________________________________________________   (provider comments below)

## 2019-07-12 NOTE — Telephone Encounter (Signed)
   Primary Cardiologist: Dr. Caryl Comes  Chart reviewed as part of pre-operative protocol coverage. Because of Erin Casey past medical history and time since last visit, he/she will require a follow-up visit in order to better assess preoperative cardiovascular risk.  Pre-op covering staff: - Please schedule appointment and call patient to inform them. - Please contact requesting surgeon's office via preferred method (i.e, phone, fax) to inform them of need for appointment prior to surgery.  If applicable, this message will also be routed to pharmacy pool and/or primary cardiologist for input on holding anticoagulant/antiplatelet agent as requested below so that this information is available at time of patient's appointment.   [place], [place]  07/12/2019, 6:04 PM

## 2019-07-13 NOTE — Telephone Encounter (Signed)
Called the patient and got her scheduled for an appointment with Oda Kilts, PA-C on 07/22/19 at [place].

## 2019-07-20 NOTE — Progress Notes (Signed)
Electrophysiology Office Note Date: 07/20/2019  ID:  Erin, Casey 10/05/1980, MRN [id number]  PCP: [name], MD Primary Cardiologist: No primary care provider on file. Electrophysiologist: None  CC: Pacemaker follow-up  Erin Casey is a 38 y.o. female seen today for cardiac clearance for R cubital tunnel release surgery. She is seen for Dr. Caryl Comes. She has a history of PVCs, NSVT with normal EF, and autonomic dysfunction.  She has a long complicated cardiac history. She previously developed lightheadedness and palpitations that were thought to be 2:2 to PVCs. Didn't tolerate flecainide. Holter showed unifocal PVCs per Dr. Caryl Comes.    She had an attempted PVC ablation 02/2018 with no inducible or spontaneously occurring ventricular arrhythmias despite isuprel or atrial pacing.    She last being seen in our clinic, the patient reports doing very well. She hasn't had symptomatic VT in some time. She still has occasional palpitations, but these are not marked or limiting. She injured her arm several months ago mechanically, but doesn't even remember how at this point. She was healing well, but then started having numbness, tingling, and weakness in her R hand. She will now require surgery and release to prevent loss of the use of her hand. She denies chest pain, dyspnea, PND, orthopnea, nausea, vomiting, dizziness, syncope, edema, weight gain, or early satiety.   Device History: Biotronik CLS PPM implanted 03/06/2012 for neurocardiogenic syncope  Past Medical History:  Diagnosis Date  . Depletion of volume of extracellular fluid   . Essential hypertension 04/20/2013  . GERD (gastroesophageal reflux disease)   . Neurocardiogenic syncope   . Neurologic cardiac syncope   . 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 Vitamin B12 deficiency 07/28/2018   Past Surgical History:  Procedure Laterality Date  . ABDOMINAL HYSTERECTOMY    . CESAREAN SECTION    . CHOLECYSTECTOMY    .  hysterectomy    . PACEMAKER INSERTION    . PVC ABLATION N/A 03/12/2018   Procedure: PVC ABLATION;  Surgeon: Evans Lance, MD;  Location: [facility];  Service: Cardiovascular;  Laterality: N/A;  . TUBAL LIGATION      Current Outpatient Medications  Medication Sig Dispense Refill  . albuterol (PROVENTIL HFA;VENTOLIN HFA) 108 (90 Base) MCG/ACT inhaler Inhale into the lungs.    . budesonide-formoterol (SYMBICORT) 160-4.5 MCG/ACT inhaler Inhale 2 puffs into the lungs 2 (two) times daily as needed (shortness of breath).    . cetirizine (ZYRTEC) 10 MG tablet Take 10 mg by mouth 2 (two) times daily.    . cetirizine (ZYRTEC) 5 MG tablet Take 1 tablet (5 mg total) by mouth daily. (Patient not taking: Reported on 07/20/2019) 30 tablet 0  . cyanocobalamin (,VITAMIN B-12,) 1000 MCG/ML injection Inject 1,000 mcg into the muscle every 30 (thirty) days.    Marland Kitchen EPINEPHrine 0.3 mg/0.3 mL IJ SOAJ injection Inject 0.3 mLs (0.3 mg total) into the muscle as needed (anaphylaxis). 2 Device 1  . famotidine (PEPCID) 20 MG tablet Take 20 mg by mouth 2 (two) times daily.    . folic acid (FOLVITE) 1 MG tablet Take 1 mg by mouth daily.    . hydrOXYzine (ATARAX/VISTARIL) 25 MG tablet Take 25 mg by mouth at bedtime.    . methylphenidate 54 MG PO CR tablet Take 1 tablet (54 mg total) by mouth every morning. 90 tablet 0  . ondansetron (ZOFRAN) 4 MG tablet Take 1 tablet (4 mg total) by mouth every 8 (eight) hours as needed. (Patient not taking:  Reported on 07/20/2019) 12 tablet 0  . oxyCODONE-acetaminophen (PERCOCET/ROXICET) 5-325 MG tablet Take 1 tablet by mouth every 4 (four) hours as needed for severe pain. (Patient not taking: Reported on 07/20/2019) 15 tablet 0  . polyethylene glycol (MIRALAX / GLYCOLAX) 17 g packet Take 17 g by mouth daily as needed for mild constipation.    . Prenatal Vit-Fe Fumarate-FA (PRENATAL MULTIVITAMIN) TABS tablet Take 1 tablet by mouth daily at 12 noon.    . Vitamin D, Ergocalciferol,  (DRISDOL) 1.25 MG (50000 UT) CAPS capsule Take 50,000 Units by mouth every 7 (seven) days.     No current facility-administered medications for this visit.     Allergies:   Cinnamon, Eggs or egg-derived products, Flu virus vaccine, Pork-derived products, Sulfa antibiotics, Amoxicillin-pot clavulanate, and Sulfamethoxazole   Social History: Social History   Socioeconomic History  . Marital status: Divorced    Spouse name: Not on file  . Number of children: Not on file  . Years of education: Not on file  . Highest education level: Not on file  Occupational History  . Not on file  Social Needs  . Financial resource strain: Not on file  . Food insecurity    Worry: Not on file    Inability: Not on file  . Transportation needs    Medical: Not on file    Non-medical: Not on file  Tobacco Use  . Smoking status: Never Smoker  . Smokeless tobacco: Never Used  Substance and Sexual Activity  . Alcohol use: No  . Drug use: Never  . Sexual activity: Not on file  Lifestyle  . Physical activity    Days per week: Not on file    Minutes per session: Not on file  . Stress: Not on file  Relationships  . Social Herbalist on phone: Not on file    Gets together: Not on file    Attends religious service: Not on file    Active member of club or organization: Not on file    Attends meetings of clubs or organizations: Not on file    Relationship status: Not on file  . Intimate partner violence    Fear of current or ex partner: Not on file    Emotionally abused: Not on file    Physically abused: Not on file    Forced sexual activity: Not on file  Other Topics Concern  . Not on file  Social History Narrative  . Not on file    Family History: Family History  Problem Relation Age of Onset  . Diabetes Mother   . Hyperlipidemia Mother   . Hypertension Father   . Heart disease Father   . Hyperlipidemia Father   . Mental illness Sister   . Cancer Maternal Grandmother   .  Hyperlipidemia Maternal Grandmother   . Heart disease Maternal Grandfather   . Hyperlipidemia Maternal Grandfather   . Hyperlipidemia Paternal Grandmother   . Hypertension Paternal Grandmother   . Heart disease Paternal Grandfather   . Hyperlipidemia Paternal Grandfather      Review of Systems: All other systems reviewed and are otherwise negative except as noted above.  Physical Exam: There were no vitals filed for this visit.   GEN- The patient is well appearing, alert and oriented x 3 today.   HEENT: normocephalic, atraumatic; sclera clear, conjunctiva pink; hearing intact; oropharynx clear; neck supple  Lungs- Clear to ausculation bilaterally, normal work of breathing.  No wheezes, rales, rhonchi Heart- Regular  rate and rhythm, no murmurs, rubs or gallops  GI- soft, non-tender, non-distended, bowel sounds present  Extremities- no clubbing, cyanosis, or edema  MS- no significant deformity or atrophy Skin- warm and dry, no rash or lesion; PPM pocket well healed Psych- euthymic mood, full affect Neuro- strength and sensation are intact  PPM Interrogation- reviewed in detail today,  See PACEART report  EKG:  EKG is ordered today. The ekg ordered today shows A paced at 88 bpm  Recent Labs: 07/26/2018: Magnesium 2.0; TSH 3.147 07/29/2018: ALT 19; BUN 14; Creatinine, Ser 0.77; Hemoglobin 14.6; Platelets 173; Potassium 3.7; Sodium 139   Wt Readings from Last 3 Encounters:  07/29/18 257 lb (116.6 kg)  07/28/18 260 lb (117.9 kg)  07/26/18 260 lb 2.3 oz (118 kg)     Other studies Reviewed: Additional studies/ records that were reviewed today include: Echo 12/2015 shows LVEF 55-60%, Previous EP office notes, Previous remote checks, Most recent labwork.   Assessment and Plan:  1.  Symptomatic neurocardiogenic syncope s/p Biotronik PPM  Normal PPM function See Pace Art report No changes today Concerta with three 1 month prescriptions with explicit dates on them by Dr. Caryl Comes  today.   2. Cardiac Clearance For Right Cubital Tunnel Release Normal EF 12/2015, low risk surgery.  She may proceed with surgery with a low risk of peri-operative complications from a cardiac perspective.   3. NSVT Normal EF in 2017. Will continue to follow for now.   Will plan repeat Echo prior to next annual visit with no symptoms currently.   4. H/o LUE DVT In the setting of non-compatible medications, apparently.   She is clear for surgery from a cardiac perspective with a low risk of peri-operative complications from a cardiac perspective. If general anaesthesia were used, the risk would be slightly high, but certainly not prohibitive.   Current medicines are reviewed at length with the patient today.   The patient does not have concerns regarding her medicines.  The following changes were made today:  none  Labs/ tests ordered today include:  No orders of the defined types were placed in this encounter.  Disposition:   Follow up with Dr. Caryl Comes in 12 months.     Jacalyn Lefevre, PA-C  07/22/2019 8:41 AM  [facility] [address] [place] [address] [phone] (office) [phone] (fax)

## 2019-07-22 ENCOUNTER — Ambulatory Visit (INDEPENDENT_AMBULATORY_CARE_PROVIDER_SITE_OTHER): Payer: 59 | Admitting: Student

## 2019-07-22 ENCOUNTER — Other Ambulatory Visit: Payer: Self-pay

## 2019-07-22 ENCOUNTER — Telehealth: Payer: Self-pay

## 2019-07-22 VITALS — BP 122/78 | HR 88 | Ht 70.5 in | Wt 251.0 lb

## 2019-07-22 DIAGNOSIS — I471 Supraventricular tachycardia: Secondary | ICD-10-CM | POA: Diagnosis not present

## 2019-07-22 LAB — CUP PACEART INCLINIC DEVICE CHECK
Date Time Interrogation Session: 20201023084431
Implantable Lead Implant Date: 20130708
Implantable Lead Implant Date: 20130708
Implantable Lead Location: 753859
Implantable Lead Location: 753860
Implantable Lead Model: 350
Implantable Lead Model: 350
Implantable Lead Serial Number: 29222139
Implantable Lead Serial Number: 29231638
Implantable Pulse Generator Implant Date: 20130708
Lead Channel Impedance Value: 507 Ohm
Lead Channel Impedance Value: 526 Ohm
Lead Channel Pacing Threshold Amplitude: 0.6 V
Lead Channel Pacing Threshold Amplitude: 0.6 V
Lead Channel Pacing Threshold Amplitude: 1.2 V
Lead Channel Pacing Threshold Amplitude: 1.2 V
Lead Channel Pacing Threshold Pulse Width: 0.4 ms
Lead Channel Pacing Threshold Pulse Width: 0.4 ms
Lead Channel Pacing Threshold Pulse Width: 0.4 ms
Lead Channel Pacing Threshold Pulse Width: 0.4 ms
Lead Channel Sensing Intrinsic Amplitude: 13.7 mV
Lead Channel Sensing Intrinsic Amplitude: 3.2 mV
Lead Channel Setting Pacing Amplitude: 2 V
Lead Channel Setting Pacing Amplitude: 2.4 V
Lead Channel Setting Pacing Pulse Width: 0.4 ms
Pulse Gen Serial Number: 66303845

## 2019-07-22 NOTE — Telephone Encounter (Signed)
Pt in office today with appt for surgical clearance. She requested refills on Methylphenidate. She was given 3 written prescriptions.

## 2019-07-22 NOTE — Patient Instructions (Signed)
Medication Instructions:  Your physician recommends that you continue on your current medications as directed. Please refer to the Current Medication list given to you today.  *If you need a refill on your cardiac medications before your next appointment, please call your pharmacy*  Lab Work: [place]   If you have labs (blood work) drawn today and your tests are completely normal, you will receive your results only by: Marland Kitchen MyChart Message (if you have MyChart) OR . A paper copy in the mail If you have any lab test that is abnormal or we need to change your treatment, we will call you to review the results.  Testing/Procedures:  In 10 months Your physician has requested that you have an echocardiogram. Echocardiography is a painless test that uses sound waves to create images of your heart. It provides your doctor with information about the size and shape of your heart and how well your heart's chambers and valves are working. This procedure takes approximately one hour. There are no restrictions for this procedure.  Follow-Up: At [facility], you and your health needs are our priority.  As part of our continuing mission to provide you with exceptional heart care, we have created designated Provider Care Teams.  These Care Teams include your primary Cardiologist (physician) and Advanced Practice Providers (APPs -  Physician Assistants and Nurse Practitioners) who all work together to provide you with the care you need, when you need it.  Your next appointment:   12 months  The format for your next appointment:   In Person  Provider:   You may see None Dr.Klein  or one of the following Advanced Practice Providers on your designated Care Team:    Chanetta Marshall, NP  Tommye Standard, PA-C  Legrand Como "Oda [place], [place]   Other Instructions

## 2019-07-22 NOTE — Pre-Procedure Instructions (Signed)
[id number] [place], [place] [address] Phone: [phone] Fax: [place], [place] [address] [place] [address] Phone: [phone] Fax: [phone]  [id number] [place], [place] [address] Phone: [phone] Fax: [phone]      Your procedure is scheduled on 07-28-19  Report to [facility] Main Entrance "A" at 0530 A.M., and check in at the Admitting office.  Call this number if you have problems the morning of surgery:  [phone]  Call [phone] if you have any questions prior to your surgery date Monday-Friday 8am-4pm    Remember:  Do not eat or drink after midnight the night before your surgery  You may drink clear liquids until 0430the morning of your surgery.   Clear liquids allowed are: Water, Non-Citrus Juices (without pulp), Carbonated Beverages, Clear Tea, Black Coffee Only, and Gatorade  Please complete your PRE-SURGERY ENSURE that was provided to you by 0430AM  the morning of surgery.  Please, if able, drink it in one setting. DO NOT SIP.   Take these medicines the morning of surgery with A SIP OF WATER: cetirizine (ZYRTEC)  famotidine (PEPCID)  albuterol (PROVENTIL HFA;VENTOLIN HFA)as needed budesonide-formoterol (SYMBICORT) as needed EPINEPHrine as needed ondansetron [facility]) as needed  7 days prior to surgery STOP taking any Aspirin (unless otherwise instructed by your surgeon), Aleve, Naproxen, Ibuprofen, Motrin, Advil, Goody's, BC's, all herbal medications, fish oil, and all vitamins.    The Morning of Surgery  Do not wear jewelry, make-up or nail polish.  Do not wear lotions, powders, or perfumes/colognes, or deodorant  Do not shave 48 hours prior to surgery.    Do not bring valuables to the hospital.  [facility] is not  responsible for any belongings or valuables.  If you are a smoker, DO NOT Smoke 24 hours prior to surgery IF you wear a CPAP at night please bring your mask, tubing, and machine the morning of surgery   Remember that you must have someone to transport you home after your surgery, and remain with you for 24 hours if you are discharged the same day.   Contacts, glasses, hearing aids, dentures or bridgework may not be worn into surgery.    Leave your suitcase in the car.  After surgery it may be brought to your room.  For patients admitted to the hospital, discharge time will be determined by your treatment team.  Patients discharged the day of surgery will not be allowed to drive home.    Special instructions:   [place]- Preparing For Surgery  Before surgery, you can play an important role. Because skin is not sterile, your skin needs to be as free of germs as possible. You can reduce the number of germs on your skin by washing with CHG (chlorahexidine gluconate) Soap before surgery.  CHG is an antiseptic cleaner which kills germs and bonds with the skin to continue killing germs even after washing.    Oral Hygiene is also important to reduce your risk of infection.  Remember - BRUSH YOUR TEETH THE MORNING OF SURGERY WITH YOUR REGULAR TOOTHPASTE  Please do not use if you have an allergy to CHG or antibacterial soaps. If your skin becomes reddened/irritated stop using the CHG.  Do not shave (including legs and underarms)  for at least 48 hours prior to first CHG shower. It is OK to shave your face.  Please follow these instructions carefully.   1. Shower the NIGHT BEFORE SURGERY and the MORNING OF SURGERY with CHG Soap.   2. If you chose to wash your hair, wash your hair first as usual with your normal shampoo.  3. After you shampoo, rinse your hair and body thoroughly to remove the shampoo.  4. Use CHG as you would any other liquid soap. You can apply CHG directly to the skin and  wash gently with a scrungie or a clean washcloth.   5. Apply the CHG Soap to your body ONLY FROM THE NECK DOWN.  Do not use on open wounds or open sores. Avoid contact with your eyes, ears, mouth and genitals (private parts). Wash Face and genitals (private parts)  with your normal soap.   6. Wash thoroughly, paying special attention to the area where your surgery will be performed.  7. Thoroughly rinse your body with warm water from the neck down.  8. DO NOT shower/wash with your normal soap after using and rinsing off the CHG Soap.  9. Pat yourself dry with a CLEAN TOWEL.  10. Wear CLEAN PAJAMAS to bed the night before surgery, wear comfortable clothes the morning of surgery  11. Place CLEAN SHEETS on your bed the night of your first shower and DO NOT SLEEP WITH PETS.    Day of Surgery:  Do not apply any deodorants/lotions. Please shower the morning of surgery with the CHG soap  Please wear clean clothes to the hospital/surgery center.   Remember to brush your teeth WITH YOUR REGULAR TOOTHPASTE.   Please read over the fact sheets that you were given.

## 2019-07-25 ENCOUNTER — Encounter (HOSPITAL_COMMUNITY): Payer: Self-pay

## 2019-07-25 ENCOUNTER — Other Ambulatory Visit: Payer: Self-pay

## 2019-07-25 ENCOUNTER — Encounter (HOSPITAL_COMMUNITY)
Admission: RE | Admit: 2019-07-25 | Discharge: 2019-07-25 | Disposition: A | Discharge status: Home / Self Care | Payer: 59 | Source: Ambulatory Visit | Attending: Orthopaedic Surgery | Admitting: Orthopaedic Surgery

## 2019-07-25 ENCOUNTER — Other Ambulatory Visit (HOSPITAL_COMMUNITY)
Admission: RE | Admit: 2019-07-25 | Discharge: 2019-07-25 | Disposition: A | Discharge status: Home / Self Care | Payer: 59 | Source: Ambulatory Visit | Attending: Orthopaedic Surgery | Admitting: Orthopaedic Surgery

## 2019-07-25 DIAGNOSIS — G5621 Lesion of ulnar nerve, right upper limb: Secondary | ICD-10-CM | POA: Insufficient documentation

## 2019-07-25 DIAGNOSIS — I10 Essential (primary) hypertension: Secondary | ICD-10-CM | POA: Diagnosis not present

## 2019-07-25 DIAGNOSIS — Z01812 Encounter for preprocedural laboratory examination: Secondary | ICD-10-CM | POA: Insufficient documentation

## 2019-07-25 DIAGNOSIS — Z9049 Acquired absence of other specified parts of digestive tract: Secondary | ICD-10-CM | POA: Diagnosis not present

## 2019-07-25 DIAGNOSIS — D649 Anemia, unspecified: Secondary | ICD-10-CM | POA: Diagnosis not present

## 2019-07-25 DIAGNOSIS — K219 Gastro-esophageal reflux disease without esophagitis: Secondary | ICD-10-CM | POA: Insufficient documentation

## 2019-07-25 DIAGNOSIS — Z90710 Acquired absence of both cervix and uterus: Secondary | ICD-10-CM | POA: Insufficient documentation

## 2019-07-25 DIAGNOSIS — G5601 Carpal tunnel syndrome, right upper limb: Secondary | ICD-10-CM | POA: Diagnosis not present

## 2019-07-25 DIAGNOSIS — E538 Deficiency of other specified B group vitamins: Secondary | ICD-10-CM | POA: Insufficient documentation

## 2019-07-25 DIAGNOSIS — Z950 Presence of cardiac pacemaker: Secondary | ICD-10-CM | POA: Diagnosis not present

## 2019-07-25 DIAGNOSIS — I493 Ventricular premature depolarization: Secondary | ICD-10-CM | POA: Diagnosis not present

## 2019-07-25 HISTORY — DX: Anemia, unspecified: D64.9

## 2019-07-25 LAB — BASIC METABOLIC PANEL
Anion gap: 6 (ref 5–15)
BUN: 10 mg/dL (ref 6–20)
CO2: 25 mmol/L (ref 22–32)
Calcium: 8.6 mg/dL — ABNORMAL LOW (ref 8.9–10.3)
Chloride: 106 mmol/L (ref 98–111)
Creatinine, Ser: 0.8 mg/dL (ref 0.44–1.00)
GFR calc Af Amer: >60 mL/min (ref >60)
GFR calc non Af Amer: >60 mL/min (ref >60)
Glucose, Bld: 123 mg/dL — ABNORMAL HIGH (ref 70–99)
Potassium: 3.8 mmol/L (ref 3.5–5.1)
Sodium: 137 mmol/L (ref 135–145)

## 2019-07-25 LAB — CBC
HCT: 45.1 % (ref 36.0–46.0)
Hemoglobin: 14.9 g/dL (ref 12.0–15.0)
MCH: 30.7 pg (ref 26.0–34.0)
MCHC: 33 g/dL (ref 30.0–36.0)
MCV: 92.8 fL (ref 80.0–100.0)
Platelets: 194 10*3/uL (ref 150–400)
RBC: 4.86 MIL/uL (ref 3.87–5.11)
RDW: 12.4 % (ref 11.5–15.5)
WBC: 4.9 10*3/uL (ref 4.0–10.5)
nRBC: 0 % (ref 0.0–0.2)

## 2019-07-25 NOTE — Progress Notes (Signed)
PCP - Horald Pollen Cardiologist - Dr. Jolyn Nap / [facility]  PPM/ICD - Pacemaker Device Orders - orders faxed to Dr. Caryl Comes Rep Notified - yes, Sunday Shams notified via call & email  Chest x-ray - n/a EKG - 07-22-19 ECHO - 2017   ERAS Protcol - yes, Ensure given   COVID TEST- 07-25-19   Anesthesia review: yes, pacer  Patient denies shortness of breath, fever, cough and chest pain at PAT appointment   All instructions explained to the patient, with a verbal understanding of the material. Patient agrees to go over the instructions while at home for a better understanding. Patient also instructed to self quarantine after being tested for COVID-19. The opportunity to ask questions was provided.

## 2019-07-25 NOTE — Progress Notes (Signed)
Email sent to Biotron[employer] rep:  Sunday Shams  Monday, 07-25-19 @ 0900  Gerald Stabs also called and notified via phone 07-25-19 @ 0845.  Pacer orders faxed to Dr. Jolyn Nap.

## 2019-07-26 ENCOUNTER — Encounter (HOSPITAL_COMMUNITY): Payer: Self-pay

## 2019-07-26 LAB — NOVEL CORONAVIRUS, NAA (HOSP ORDER, SEND-OUT TO REF LAB; TAT 18-24 HRS): SARS-CoV-2, NAA: NOT DETECTED

## 2019-07-26 NOTE — Progress Notes (Signed)
Anesthesia Chart Review:  Case: [id number] Date/Time: 07/28/19 0715   Procedure: Right carpal tunnel release, right cubital tunnel release with possible anterior transposition and surgery as indicated (Right ) - 25min   Anesthesia type: Monitor Anesthesia Care   Pre-op diagnosis: Rt ulnar nerve lesion, right carpal tunnel syndrome   Location: [facility] OR ROOM 06 / [place] OR   Surgeon: Verner Mould, MD      DISCUSSION: Patient is a 38 year old female scheduled for the above procedure.  History includes never smoker, neurocardiogenic syncope (failed Florinef, midodrine, compression stockings; s/p Biotronik Entovis DR-T 04/05/12), PVCs (EP study 03/12/18: No inducible or spontaneously occurring ventricular arrhythmias despite isuprel or atrial pacing), HTN, GERD, vitamin B12 deficiency, anemia, hysterectomy (01/05/08), LUE superficial venous thrombosis 10/2017 (Xarelto X 6 weeks recommended given degree of swelling with same side PPM). Multiple medication allergies with anaphylactic reaction (see allergy list and PROVIDER section for additional details). BMI is consistent with obesity.   Last seen by Barrington Ellison, PA-C with [facility] EP on 07/22/19 for cardiac clearance for surgery.  Normal PPM function at that time.  No significant palpitations. Plan for one year follow-up with echo prior to that visit. He wrote, "She is clear for surgery from a cardiac perspective with a low risk of peri-operative complications from a cardiac perspective. If general anaesthesia were used, the risk would be slightly high, but certainly not prohibitive."   Per Perioperative RX cardiac device form: Procedure may interfere with device function.  Magnet should be placed over device during procedure.  Postop interrogation is not needed.  07/25/2019 COVID-19 test negative.  If no acute changes and I would anticipate that she could proceed as planned.   VS: BP 119/78   Pulse 81   Temp 36.6 C   Resp 18   Ht 5' 10.5"  (1.791 m)   Wt 114.8 kg   SpO2 99%   BMI 35.80 kg/m     PROVIDERS: [name], MD his PCP  - Virl Axe, MD is EP cardiologist in [place]. Last visit 07/22/19. She also see Norville Haggard, MD with [facility], last visit 11/12/17.  - She saw [facility] dermatologist Michael Boston, MD and Allergist/Immunologist Radojicic, Cristine, MD in 10/2018 following anaphylactic reaction to flu vaccine, as well as episodes of your urticaria and occasional dermatitis. 11/24/18 IgE allergen beef & pork, C4 Complement, Alpha Galactose IgE, Tryptase results WNL. Patient declined polysorbate testing. Notes also state that Dr. Harold Hedge did testing 10/21 (2019?) showing, "chicken, egg, galactose alpha 1,3 galactose IgE <0.01, gelatin IgE <0.1, latex <0.1, region II allergy panel was negative. Tryptase 3.4."  Also by notes, "Erythema at time of patch application on [id number]. Negative at final reading on 11/12/18. Patient wishes to avoid the following: -4-Chloro-3-cresol -Benzoic Acid and Benzoates -Chloroxylenol -Cinnamic Alcohol -Cinnamic Aldehyde -Fragrance Mix II -Geraniol -Parabens -Polysorbate 80"     LABS: Labs reviewed: Acceptable for surgery. (all labs ordered are listed, but only abnormal results are displayed)  Labs Reviewed  BASIC METABOLIC PANEL - Abnormal; Notable for the following components:      Result Value   Glucose, Bld 123 (*)    Calcium 8.6 (*)    All other components within normal limits  CBC     IMAGES: NM Gastric Emptying Study 06/24/19: IMPRESSION: Normal gastric emptying study.   EKG: 07/22/19: Electronic atrial pacemaker.   CV: PPM Check 07/22/19: Normal device function. Thresholds, sensing, impedances consistent with previous measurements. Device programmed to maximize longevity. She has occasional  AT and HVR episodes noted. EGMs appear to be sinus tach or SVT with A=V. She has occasional NSVT, but low burden. No mode switch or high ventricular rates noted.  Device programmed at appropriate safety margins. Histogram distribution appropriate for patient activity level. Estimated longevity 4 yr, 11 mo. Patient enrolled in remote follow-up/TTM's with [employer].   Ambulatory cardiac monitor 11/12/17-11/16/17: - Predominant underlying rhythm was atrioventricular paced rhythm. - Ventricular ectopy = 15216 (3%) isolated multifocal PVCs. - Supraventricular ectopy = 0. - Neither patient events (button presses) nor diary entries were reported.   ETT 08/19/17 [facility]): Overall Impression Negative study for ischemia by EKG criteria   Echo 08/18/17 [facility]) SUMMARY The left ventricular size is normal. There is normal left ventricular wall thickness. Left ventricular systolic function is normal. LV ejection fraction = 60-65%. Left ventricular filling pattern is normal. The left ventricular wall motion is normal. The right ventricle is normal in size and function. Device lead in the right ventricle There is a catheter/pacemaker lead seen in the RA cavity. There is mild tricuspid regurgitation. The aortic root is normal size. IVC size was normal. There is no pericardial effusion. No prior echo available for comparison.   Past Medical History:  Diagnosis Date  . Anemia   . Depletion of volume of extracellular fluid   . 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 Essential hypertension 04/20/2013  . GERD (gastroesophageal reflux disease)   . Neurocardiogenic syncope   . Neurocardiogenic syncope 2003  . Neurologic cardiac syncope   . 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 Vitamin B12 deficiency 07/28/2018    Past Surgical History:  Procedure Laterality Date  . ABDOMINAL HYSTERECTOMY    . CESAREAN SECTION    . CHOLECYSTECTOMY    . hysterectomy    . LAPAROSCOPIC ROUX-EN-Y GASTRIC BYPASS WITH UPPER ENDOSCOPY AND REMOVAL OF LAP BAND    . PACEMAKER INSERTION    . PVC ABLATION N/A 03/12/2018   Procedure: PVC ABLATION;  Surgeon: Evans Lance, MD;  Location: [facility];  Service: Cardiovascular;  Laterality: N/A;  . TONSILLECTOMY AND ADENOIDECTOMY    . TUBAL LIGATION      MEDICATIONS: . albuterol (PROVENTIL HFA;VENTOLIN HFA) 108 (90 Base) MCG/ACT inhaler  . budesonide-formoterol (SYMBICORT) 160-4.5 MCG/ACT inhaler  . cetirizine (ZYRTEC) 10 MG tablet  . cyanocobalamin (,VITAMIN B-12,) 1000 MCG/ML injection  . EPINEPHrine 0.3 mg/0.3 mL IJ SOAJ injection  . famotidine (PEPCID) 20 MG tablet  . folic acid (FOLVITE) 1 MG tablet  . hydrOXYzine (ATARAX/VISTARIL) 25 MG tablet  . methylphenidate 54 MG PO CR tablet  . ondansetron (ZOFRAN) 4 MG tablet  . polyethylene glycol (MIRALAX / GLYCOLAX) 17 g packet  . Prenatal Vit-Fe Fumarate-FA (PRENATAL MULTIVITAMIN) TABS tablet  . Vitamin D, Ergocalciferol, (DRISDOL) 1.25 MG (50000 UT) CAPS capsule   No current facility-administered medications for this encounter.     Myra Gianotti, PA-C Surgical Short Stay/Anesthesiology [facility] Phone [phone] [facility] Phone [phone] 07/26/2019 2:52 PM

## 2019-07-26 NOTE — Anesthesia Preprocedure Evaluation (Addendum)
Anesthesia Evaluation  Patient identified by MRN, date of birth, ID band Patient awake    Reviewed: Allergy & Precautions, NPO status , Patient's Chart, lab work & pertinent test results  Airway Mallampati: II  TM Distance: >3 FB Neck ROM: Full    Dental no notable dental hx. (+) Teeth Intact, Dental Advisory Given   Pulmonary    Pulmonary exam normal breath sounds clear to auscultation       Cardiovascular hypertension, + DVT (LUE superficial venous thrombosis 10/2017 (Xarelto X 6 weeks recommended given degree of swelling with same side PPM)  Normal cardiovascular exam+ dysrhythmias (PVCs EP study 03/12/18: No inducible or spontaneously occurring ventricular arrhythmias despite isuprel or atrial pacing) + pacemaker (neurocardiogenic syncope s/p Biotronik Entovis DR-T 04/05/12)  Rhythm:Regular Rate:Normal     Neuro/Psych    GI/Hepatic GERD  ,  Endo/Other    Renal/GU      Musculoskeletal   Abdominal   Peds  Hematology   Anesthesia Other Findings EKG: 07/22/19: Electronic atrial pacemaker.  PPM Check 07/22/19: Normal device function. Thresholds, sensing, impedances consistent with previous measurements. Device programmed tomaximize longevity. She  has occasional AT and HVR episodes noted. EGMs appear to be sinus tach or SVT with A=V. She has occasional NSVT, but low burden. No mode switch or high ventricular rates noted.  Ambulatory cardiac monitor 11/12/17-11/16/17: - Predominant underlying rhythm was atrioventricular paced rhythm. - Ventricular ectopy = 15216 (3%) isolated multifocal PVCs. - Supraventricular ectopy = 0. - Neither patient events (button presses) nor diary entries were reported.  ETT 08/19/17 [facility] CE): Overall Impression Negative study for ischemia by EKG criteria  Echo 08/18/17 [facility] CE) SUMMARY The left ventricular size is normal. There is normal left ventricular wall thickness. Left  ventricular systolic function is normal. LV ejection fraction = 60-65%. Left ventricular filling pattern is normal. The left ventricular wall motion is normal. The right ventricle is normal in size and function. Device lead in the right ventricle There is a catheter/pacemaker lead seen in the RA cavity. There is mild tricuspid regurgitation. The aortic root is normal size. IVC size was normal. There is no pericardial effusion. No prior echo available for comparison  Reproductive/Obstetrics                           Anesthesia Physical Anesthesia Plan  ASA: III  Anesthesia Plan: MAC and Regional   Post-op Pain Management:  Regional for Post-op pain   Induction: Intravenous  PONV Risk Score and Plan: 2 and Propofol infusion, Treatment may vary due to age or medical condition, Midazolam, Ondansetron and Dexamethasone  Airway Management Planned: Natural Airway and Simple Face Mask  Additional Equipment:   Intra-op Plan:   Post-operative Plan:   Informed Consent: I have reviewed the patients History and Physical, chart, labs and discussed the procedure including the risks, benefits and alternatives for the proposed anesthesia with the patient or authorized representative who has indicated his/her understanding and acceptance.     Dental advisory given  Plan Discussed with: CRNA  Anesthesia Plan Comments: ( )       Anesthesia Quick Evaluation

## 2019-07-28 ENCOUNTER — Ambulatory Visit (HOSPITAL_COMMUNITY): Payer: 59 | Admitting: Certified Registered"

## 2019-07-28 ENCOUNTER — Encounter (HOSPITAL_COMMUNITY)
Admission: RE | Disposition: A | Discharge status: Home / Self Care | Payer: Self-pay | Source: Home / Self Care | Attending: Orthopaedic Surgery

## 2019-07-28 ENCOUNTER — Encounter (HOSPITAL_COMMUNITY): Payer: Self-pay

## 2019-07-28 ENCOUNTER — Ambulatory Visit (HOSPITAL_COMMUNITY)
Admission: RE | Admit: 2019-07-28 | Discharge: 2019-07-28 | Disposition: A | Discharge status: Home / Self Care | Payer: 59 | Attending: Orthopaedic Surgery | Admitting: Orthopaedic Surgery

## 2019-07-28 ENCOUNTER — Ambulatory Visit (HOSPITAL_COMMUNITY): Payer: 59 | Admitting: Vascular Surgery

## 2019-07-28 ENCOUNTER — Other Ambulatory Visit: Payer: Self-pay

## 2019-07-28 DIAGNOSIS — I10 Essential (primary) hypertension: Secondary | ICD-10-CM | POA: Diagnosis not present

## 2019-07-28 DIAGNOSIS — R55 Syncope and collapse: Secondary | ICD-10-CM | POA: Diagnosis not present

## 2019-07-28 DIAGNOSIS — Z86718 Personal history of other venous thrombosis and embolism: Secondary | ICD-10-CM | POA: Diagnosis not present

## 2019-07-28 DIAGNOSIS — Z882 Allergy status to sulfonamides status: Secondary | ICD-10-CM | POA: Diagnosis not present

## 2019-07-28 DIAGNOSIS — G5601 Carpal tunnel syndrome, right upper limb: Secondary | ICD-10-CM | POA: Insufficient documentation

## 2019-07-28 DIAGNOSIS — G901 Familial dysautonomia [Riley-Day]: Secondary | ICD-10-CM | POA: Diagnosis not present

## 2019-07-28 DIAGNOSIS — Z9884 Bariatric surgery status: Secondary | ICD-10-CM | POA: Insufficient documentation

## 2019-07-28 DIAGNOSIS — Z880 Allergy status to penicillin: Secondary | ICD-10-CM | POA: Diagnosis not present

## 2019-07-28 DIAGNOSIS — Z950 Presence of cardiac pacemaker: Secondary | ICD-10-CM | POA: Insufficient documentation

## 2019-07-28 DIAGNOSIS — Z887 Allergy status to serum and vaccine status: Secondary | ICD-10-CM | POA: Insufficient documentation

## 2019-07-28 DIAGNOSIS — Z79899 Other long term (current) drug therapy: Secondary | ICD-10-CM | POA: Diagnosis not present

## 2019-07-28 DIAGNOSIS — Z7951 Long term (current) use of inhaled steroids: Secondary | ICD-10-CM | POA: Diagnosis not present

## 2019-07-28 DIAGNOSIS — K219 Gastro-esophageal reflux disease without esophagitis: Secondary | ICD-10-CM | POA: Insufficient documentation

## 2019-07-28 DIAGNOSIS — G5621 Lesion of ulnar nerve, right upper limb: Secondary | ICD-10-CM | POA: Diagnosis not present

## 2019-07-28 HISTORY — PX: ULNAR TUNNEL RELEASE: SHX820

## 2019-07-28 SURGERY — RELEASE, CUBITAL TUNNEL
Anesthesia: Monitor Anesthesia Care | Site: Hand | Laterality: Right

## 2019-07-28 MED ORDER — HYDROCODONE-ACETAMINOPHEN 5-325 MG PO TABS: 1.0000 | ORAL_TABLET | Freq: Four times a day (QID) | ORAL | 0 refills | Status: DC | PRN

## 2019-07-28 MED ORDER — LACTATED RINGERS IV SOLN: INTRAVENOUS | Status: DC

## 2019-07-28 MED ORDER — CHLORHEXIDINE GLUCONATE 4 % EX LIQD: 60.0000 mL | Freq: Once | CUTANEOUS | Status: DC

## 2019-07-28 MED ORDER — DIPHENHYDRAMINE HCL 50 MG/ML IJ SOLN
INTRAMUSCULAR | Status: DC | PRN
  Administered 2019-07-28: 6.25 mg via INTRAVENOUS

## 2019-07-28 MED ORDER — ONDANSETRON HCL 4 MG PO TABS: 4.0000 mg | ORAL_TABLET | Freq: Three times a day (TID) | ORAL | 0 refills | Status: DC | PRN

## 2019-07-28 MED ORDER — FENTANYL CITRATE (PF) 250 MCG/5ML IJ SOLN
INTRAMUSCULAR | Status: DC | PRN
  Administered 2019-07-28 (×2): 25 ug via INTRAVENOUS
  Administered 2019-07-28: 100 ug via INTRAVENOUS
  Administered 2019-07-28: 50 ug via INTRAVENOUS

## 2019-07-28 MED ORDER — LIDOCAINE HCL (CARDIAC) PF 100 MG/5ML IV SOSY
PREFILLED_SYRINGE | INTRAVENOUS | Status: DC | PRN
  Administered 2019-07-28: 40 mg via INTRATRACHEAL

## 2019-07-28 MED ORDER — LIDOCAINE 2% (20 MG/ML) 5 ML SYRINGE
INTRAMUSCULAR | Status: AC
  Filled 2019-07-28: qty 5

## 2019-07-28 MED ORDER — MIDAZOLAM HCL 2 MG/2ML IJ SOLN
INTRAMUSCULAR | Status: AC
  Filled 2019-07-28: qty 2

## 2019-07-28 MED ORDER — ONDANSETRON HCL 4 MG/2ML IJ SOLN
INTRAMUSCULAR | Status: AC
  Filled 2019-07-28: qty 2

## 2019-07-28 MED ORDER — PROPOFOL 500 MG/50ML IV EMUL
INTRAVENOUS | Status: DC | PRN
  Administered 2019-07-28: 100 ug/kg/min via INTRAVENOUS

## 2019-07-28 MED ORDER — FENTANYL CITRATE (PF) 100 MCG/2ML IJ SOLN
25.0000 ug | INTRAMUSCULAR | Status: DC | PRN
  Administered 2019-07-28 (×2): 50 ug via INTRAVENOUS

## 2019-07-28 MED ORDER — DEXAMETHASONE SODIUM PHOSPHATE 4 MG/ML IJ SOLN
INTRAMUSCULAR | Status: DC | PRN
  Administered 2019-07-28: 4 mg via PERINEURAL

## 2019-07-28 MED ORDER — CLINDAMYCIN PHOSPHATE 900 MG/50ML IV SOLN
900.0000 mg | INTRAVENOUS | Status: AC
  Administered 2019-07-28: 900 mg via INTRAVENOUS
  Filled 2019-07-28: qty 50

## 2019-07-28 MED ORDER — 0.9 % SODIUM CHLORIDE (POUR BTL) OPTIME
TOPICAL | Status: DC | PRN
  Administered 2019-07-28: 1000 mL

## 2019-07-28 MED ORDER — DEXAMETHASONE SODIUM PHOSPHATE 10 MG/ML IJ SOLN
INTRAMUSCULAR | Status: AC
  Filled 2019-07-28: qty 1

## 2019-07-28 MED ORDER — ONDANSETRON HCL 4 MG/2ML IJ SOLN
INTRAMUSCULAR | Status: DC | PRN
  Administered 2019-07-28: 4 mg via INTRAVENOUS

## 2019-07-28 MED ORDER — FENTANYL CITRATE (PF) 250 MCG/5ML IJ SOLN
INTRAMUSCULAR | Status: AC
  Filled 2019-07-28: qty 5

## 2019-07-28 MED ORDER — DEXAMETHASONE SODIUM PHOSPHATE 10 MG/ML IJ SOLN
INTRAMUSCULAR | Status: DC | PRN
  Administered 2019-07-28: 5 mg via INTRAVENOUS

## 2019-07-28 MED ORDER — LACTATED RINGERS IV SOLN
INTRAVENOUS | Status: DC | PRN
  Administered 2019-07-28 (×2): via INTRAVENOUS

## 2019-07-28 MED ORDER — FENTANYL CITRATE (PF) 100 MCG/2ML IJ SOLN
INTRAMUSCULAR | Status: AC
  Administered 2019-07-28: 50 ug via INTRAVENOUS
  Filled 2019-07-28: qty 2

## 2019-07-28 MED ORDER — MIDAZOLAM HCL 2 MG/2ML IJ SOLN
INTRAMUSCULAR | Status: DC | PRN
  Administered 2019-07-28: 2 mg via INTRAVENOUS

## 2019-07-28 MED ORDER — ROPIVACAINE HCL 5 MG/ML IJ SOLN
INTRAMUSCULAR | Status: DC | PRN
  Administered 2019-07-28: 20 mL via PERINEURAL

## 2019-07-28 MED ORDER — PROPOFOL 10 MG/ML IV BOLUS
INTRAVENOUS | Status: DC | PRN
  Administered 2019-07-28: 20 mg via INTRAVENOUS

## 2019-07-28 SURGICAL SUPPLY — 57 items
BLADE CARPAL TUNNEL SNGL USE (BLADE) ×2 IMPLANT
BNDG CMPR 9X4 STRL LF SNTH (GAUZE/BANDAGES/DRESSINGS) ×1
BNDG ELASTIC 3X5.8 VLCR STR LF (GAUZE/BANDAGES/DRESSINGS) ×2 IMPLANT
BNDG ELASTIC 4X5.8 VLCR STR LF (GAUZE/BANDAGES/DRESSINGS) ×4 IMPLANT
BNDG ESMARK 4X9 LF (GAUZE/BANDAGES/DRESSINGS) ×2 IMPLANT
BNDG GAUZE ELAST 4 BULKY (GAUZE/BANDAGES/DRESSINGS) ×8 IMPLANT
CORD BIPOLAR FORCEPS 12FT (ELECTRODE) ×3 IMPLANT
COVER SURGICAL LIGHT HANDLE (MISCELLANEOUS) ×3 IMPLANT
COVER WAND RF STERILE (DRAPES) ×3 IMPLANT
CUFF TOURN SGL QUICK 18X4 (TOURNIQUET CUFF) ×2 IMPLANT
CUFF TOURN SGL QUICK 24 (TOURNIQUET CUFF)
CUFF TRNQT CYL 24X4X16.5-23 (TOURNIQUET CUFF) IMPLANT
DECANTER SPIKE VIAL GLASS SM (MISCELLANEOUS) ×1 IMPLANT
DRAPE OEC MINIVIEW 54X84 (DRAPES) IMPLANT
DRAPE SURG 17X23 STRL (DRAPES) ×3 IMPLANT
GAUZE SPONGE 4X4 12PLY STRL (GAUZE/BANDAGES/DRESSINGS) ×2 IMPLANT
GAUZE XEROFORM 1X8 LF (GAUZE/BANDAGES/DRESSINGS) ×2 IMPLANT
GLOVE BIOGEL PI IND STRL 8 (GLOVE) IMPLANT
GLOVE BIOGEL PI INDICATOR 8 (GLOVE) ×2
GLOVE SS BIOGEL STRL SZ 8 (GLOVE) ×1 IMPLANT
GLOVE SUPERSENSE BIOGEL SZ 8 (GLOVE)
GLOVE SURG SYN 7.5  E (GLOVE) ×2
GLOVE SURG SYN 7.5 E (GLOVE) ×1 IMPLANT
GLOVE SURG SYN 7.5 PF PI (GLOVE) IMPLANT
GOWN STRL REUS W/ TWL LRG LVL3 (GOWN DISPOSABLE) ×2 IMPLANT
GOWN STRL REUS W/ TWL XL LVL3 (GOWN DISPOSABLE) ×3 IMPLANT
GOWN STRL REUS W/TWL LRG LVL3 (GOWN DISPOSABLE) ×6
GOWN STRL REUS W/TWL XL LVL3 (GOWN DISPOSABLE)
KIT BASIN OR (CUSTOM PROCEDURE TRAY) ×3 IMPLANT
KIT TURNOVER KIT B (KITS) ×3 IMPLANT
MANIFOLD NEPTUNE II (INSTRUMENTS) ×1 IMPLANT
NDL HYPO 25GX1X1/2 BEV (NEEDLE) IMPLANT
NEEDLE HYPO 25GX1X1/2 BEV (NEEDLE) IMPLANT
NS IRRIG 1000ML POUR BTL (IV SOLUTION) ×3 IMPLANT
PACK ORTHO EXTREMITY (CUSTOM PROCEDURE TRAY) ×3 IMPLANT
PAD ARMBOARD 7.5X6 YLW CONV (MISCELLANEOUS) ×6 IMPLANT
PAD CAST 4YDX4 CTTN HI CHSV (CAST SUPPLIES) IMPLANT
PADDING CAST COTTON 4X4 STRL (CAST SUPPLIES) ×9
SLING ARM FOAM STRAP LRG (SOFTGOODS) ×2 IMPLANT
SOL PREP POV-IOD 4OZ 10% (MISCELLANEOUS) ×3 IMPLANT
SPECIMEN JAR SMALL (MISCELLANEOUS) ×3 IMPLANT
SPLINT FIBERGLASS 4X30 (CAST SUPPLIES) ×2 IMPLANT
SUCTION FRAZIER HANDLE 10FR (MISCELLANEOUS)
SUCTION TUBE FRAZIER 10FR DISP (MISCELLANEOUS) IMPLANT
SUT MERSILENE 4 0 P 3 (SUTURE) IMPLANT
SUT PROLENE 4 0 PS 2 18 (SUTURE) ×4 IMPLANT
SUT VIC AB 2-0 CT1 27 (SUTURE)
SUT VIC AB 2-0 CT1 TAPERPNT 27 (SUTURE) IMPLANT
SUT VIC AB 3-0 SH 27 (SUTURE) ×3
SUT VIC AB 3-0 SH 27X BRD (SUTURE) IMPLANT
SYR CONTROL 10ML LL (SYRINGE) IMPLANT
TOWEL GREEN STERILE (TOWEL DISPOSABLE) ×3 IMPLANT
TOWEL GREEN STERILE FF (TOWEL DISPOSABLE) ×3 IMPLANT
TUBE CONNECTING 12'X1/4 (SUCTIONS)
TUBE CONNECTING 12X1/4 (SUCTIONS) IMPLANT
UNDERPAD 30X30 (UNDERPADS AND DIAPERS) ×3 IMPLANT
WATER STERILE IRR 1000ML POUR (IV SOLUTION) ×1 IMPLANT

## 2019-07-28 NOTE — Discharge Instructions (Signed)
Monitored Anesthesia Care Anesthesia is a term that refers to techniques, procedures, and medicines that help a person stay safe and comfortable during a medical procedure. Monitored anesthesia care, or sedation, is one type of anesthesia. Your anesthesia specialist may recommend sedation if you will be having a procedure that does not require you to be unconscious, such as:  Cataract surgery.  A dental procedure.  A biopsy.  A colonoscopy. During the procedure, you may receive a medicine to help you relax (sedative). There are three levels of sedation:  Mild sedation. At this level, you may feel awake and relaxed. You will be able to follow directions.  Moderate sedation. At this level, you will be sleepy. You may not remember the procedure.  Deep sedation. At this level, you will be asleep. You will not remember the procedure. The more medicine you are given, the deeper your level of sedation will be. Depending on how you respond to the procedure, the anesthesia specialist may change your level of sedation or the type of anesthesia to fit your needs. An anesthesia specialist will monitor you closely during the procedure. Let your health care provider know about:  Any allergies you have.  All medicines you are taking, including vitamins, herbs, eye drops, creams, and over-the-counter medicines.  Any use of steroids (by mouth or as a cream).  Any problems you or family members have had with sedatives and anesthetic medicines.  Any blood disorders you have.  Any surgeries you have had.  Any medical conditions you have, such as sleep apnea.  Whether you are pregnant or may be pregnant.  Any use of cigarettes, alcohol, or street drugs. What are the risks? Generally, this is a safe procedure. However, problems may occur, including:  Getting too much medicine (oversedation).  Nausea.  Allergic reaction to medicines.  Trouble breathing. If this happens, a breathing tube may be  used to help with breathing. It will be removed when you are awake and breathing on your own.  Heart trouble.  Lung trouble. Before the procedure Staying hydrated Follow instructions from your health care provider about hydration, which may include:  Up to 2 hours before the procedure - you may continue to drink clear liquids, such as water, clear fruit juice, black coffee, and plain tea. Eating and drinking restrictions Follow instructions from your health care provider about eating and drinking, which may include:  8 hours before the procedure - stop eating heavy meals or foods such as meat, fried foods, or fatty foods.  6 hours before the procedure - stop eating light meals or foods, such as toast or cereal.  6 hours before the procedure - stop drinking milk or drinks that contain milk.  2 hours before the procedure - stop drinking clear liquids. Medicines Ask your health care provider about:  Changing or stopping your regular medicines. This is especially important if you are taking diabetes medicines or blood thinners.  Taking medicines such as aspirin and ibuprofen. These medicines can thin your blood. Do not take these medicines before your procedure if your health care provider instructs you not to. Tests and exams  You will have a physical exam.  You may have blood tests done to show: ? How well your kidneys and liver are working. ? How well your blood can clot. General instructions  Plan to have someone take you home from the hospital or clinic.  If you will be going home right after the procedure, plan to have someone with you  for 24 hours.  What happens during the procedure?  Your blood pressure, heart rate, breathing, level of pain and overall condition will be monitored.  An IV tube will be inserted into one of your veins.  Your anesthesia specialist will give you medicines as needed to keep you comfortable during the procedure. This may mean changing the  level of sedation.  The procedure will be performed. After the procedure  Your blood pressure, heart rate, breathing rate, and blood oxygen level will be monitored until the medicines you were given have worn off.  Do not drive for 24 hours if you received a sedative.  You may: ? Feel sleepy, clumsy, or nauseous. ? Feel forgetful about what happened after the procedure. ? Have a sore throat if you had a breathing tube during the procedure. ? Vomit. This information is not intended to replace advice given to you by your health care provider. Make sure you discuss any questions you have with your health care provider. Document Released: 06/11/2005 Document Revised: 08/28/2017 Document Reviewed: 01/06/2016 Elsevier Patient Education  Erin Casey. Discharge Instructions  - Keep dressings in place. Do not remove them. - The dressings must stay dry - Take all medication as prescribed. Transition to over the counter pain medication as your pain improves - Keep the hand elevated over the next 48-72 hours to help with pain and swelling - Move all digits not restricted by the dressings regularly to prevent stiffness - Please call to schedule a follow up appointment with Dr. Jeannie Fend and therapy at [phone] for 10-14 days following surgery - Your pain medication have been send digitally to your pharmacy

## 2019-07-28 NOTE — Anesthesia Postprocedure Evaluation (Signed)
Anesthesia Post Note  Patient: Erin Casey  Procedure(s) Performed: Right carpal tunnel release, right cubital tunnel release (Right Hand)     Patient location during evaluation: PACU Anesthesia Type: Regional and MAC Level of consciousness: awake and alert Pain management: pain level controlled Vital Signs Assessment: post-procedure vital signs reviewed and stable Respiratory status: spontaneous breathing, nonlabored ventilation, respiratory function stable and patient connected to nasal cannula oxygen Cardiovascular status: stable and blood pressure returned to baseline Postop Assessment: no apparent nausea or vomiting Anesthetic complications: no    Last Vitals:  Vitals:   07/28/19 0918 07/28/19 0933  BP: (!) 142/76 (!) 138/94  Pulse: 82 79  Resp: 14 14  Temp:  36.8 C  SpO2: 100% 98%    Last Pain:  Vitals:   07/28/19 0933  PainSc: 9                  Zamzam Whinery L Azrielle Springsteen

## 2019-07-28 NOTE — Anesthesia Procedure Notes (Signed)
Anesthesia Regional Block: Supraclavicular block   Pre-Anesthetic Checklist: ,, timeout performed, Correct Patient, Correct Site, Correct Laterality, Correct Procedure, Correct Position, site marked, Risks and benefits discussed,  Surgical consent,  Pre-op evaluation,  At surgeon's request and post-op pain management  Laterality: Right  Prep: Maximum Sterile Barrier Precautions used, chloraprep       Needles:  Injection technique: Single-shot  Needle Type: Echogenic Stimulator Needle     Needle Length: 5cm  Needle Gauge: 22     Additional Needles:   Procedures:,,,, ultrasound used (permanent image in chart),,,,  Narrative:  Start time: 07/28/2019 7:10 AM End time: 07/28/2019 7:25 AM Injection made incrementally with aspirations every 5 mL.  Performed by: Personally  Anesthesiologist: Freddrick March, MD  Additional Notes: Monitors applied. No increased pain on injection. No increased resistance to injection. Injection made in 5cc increments. Good needle visualization. Patient tolerated procedure well.

## 2019-07-28 NOTE — Transfer of Care (Signed)
Immediate Anesthesia Transfer of Care Note  Patient: Erin Casey  Procedure(s) Performed: Right carpal tunnel release, right cubital tunnel release (Right Hand)  Patient Location: PACU  Anesthesia Type:MAC and Regional  Level of Consciousness: drowsy and patient cooperative  Airway & Oxygen Therapy: Patient Spontanous Breathing  Post-op Assessment: Report given to RN and Post -op Vital signs reviewed and stable  Post vital signs: Reviewed and stable  Last Vitals:  Vitals Value Taken Time  BP 126/74 07/28/19 0833  Temp    Pulse 89 07/28/19 0835  Resp 15 07/28/19 0835  SpO2 98 % 07/28/19 0835  Vitals shown include unvalidated device data.  Last Pain:  Vitals:   07/28/19 0653  PainSc: 0-No pain      Patients Stated Pain Goal: 4 (63/89/37 3428)  Complications: No apparent anesthesia complications

## 2019-07-28 NOTE — H&P (Signed)
ORTHOPAEDIC H&P  PCP:  [name], MD  Chief Complaint: Right hand numbness  HPI: Erin Casey is a 38 y.o. female who complains of  Right hand numbness and tingling. She has a diagnosis of right carpal and cubital tunnel syndrome. She underwent extensive nonoperative treatment and had continued right arm symptoms. She presents today for right CTR, CuTR.  Past Medical History:  Diagnosis Date  . Anemia   . Depletion of volume of extracellular fluid   . 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 Essential hypertension 04/20/2013  . GERD (gastroesophageal reflux disease)   . Neurocardiogenic syncope   . Neurocardiogenic syncope 2003  . Neurologic cardiac syncope   . 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 Vitamin B12 deficiency 07/28/2018   Past Surgical History:  Procedure Laterality Date  . ABDOMINAL HYSTERECTOMY    . CESAREAN SECTION    . CHOLECYSTECTOMY    . hysterectomy    . LAPAROSCOPIC ROUX-EN-Y GASTRIC BYPASS WITH UPPER ENDOSCOPY AND REMOVAL OF LAP BAND    . PACEMAKER INSERTION    . PVC ABLATION N/A 03/12/2018   Procedure: PVC ABLATION;  Surgeon: Evans Lance, MD;  Location: [facility];  Service: Cardiovascular;  Laterality: N/A;  . TONSILLECTOMY AND ADENOIDECTOMY    . TUBAL LIGATION     Social History   Socioeconomic History  . Marital status: Divorced    Spouse name: Not on file  . Number of children: Not on file  . Years of education: Not on file  . Highest education level: Not on file  Occupational History  . Not on file  Social Needs  . Financial resource strain: Not on file  . Food insecurity    Worry: Not on file    Inability: Not on file  . Transportation needs    Medical: Not on file    Non-medical: Not on file  Tobacco Use  . Smoking status: Never Smoker  . Smokeless tobacco: Never Used  Substance and Sexual Activity  . Alcohol use: No    Comment: occassionally  . Drug use: Never  . Sexual activity: Not on file  Lifestyle  . Physical  activity    Days per week: Not on file    Minutes per session: Not on file  . Stress: Not on file  Relationships  . Social Herbalist on phone: Not on file    Gets together: Not on file    Attends religious service: Not on file    Active member of club or organization: Not on file    Attends meetings of clubs or organizations: Not on file    Relationship status: Not on file  Other Topics Concern  . Not on file  Social History Narrative  . Not on file   Family History  Problem Relation Age of Onset  . Diabetes Mother   . Hyperlipidemia Mother   . Hypertension Father   . Heart disease Father   . Hyperlipidemia Father   . Mental illness Sister   . Cancer Maternal Grandmother   . Hyperlipidemia Maternal Grandmother   . Heart disease Maternal Grandfather   . Hyperlipidemia Maternal Grandfather   . Hyperlipidemia Paternal Grandmother   . Hypertension Paternal Grandmother   . Heart disease Paternal Grandfather   . Hyperlipidemia Paternal Grandfather    Allergies  Allergen Reactions  . Cinnamon Anaphylaxis  . Eggs Or Egg-Derived Products Anaphylaxis  . Flu Virus Vaccine Anaphylaxis  . Polysorbate Anaphylaxis  . Pork-Derived  Products Anaphylaxis  . Sulfa Antibiotics Hives, Nausea Only and Rash  . Amoxicillin-Pot Clavulanate Other (See Comments)    [Derm] [GI]  . Sulfamethoxazole Nausea And Vomiting and Rash   Prior to Admission medications   Medication Sig Start Date End Date Taking? Authorizing Provider  budesonide-formoterol (SYMBICORT) 160-4.5 MCG/ACT inhaler Inhale 2 puffs into the lungs 2 (two) times daily as needed (shortness of breath).   Yes [provider]  cetirizine (ZYRTEC) 10 MG tablet Take 10 mg by mouth 2 (two) times daily.   Yes [provider]  cyanocobalamin (,VITAMIN B-12,) 1000 MCG/ML injection Inject 1,000 mcg into the muscle once a week.    Yes [provider]  famotidine (PEPCID) 20 MG tablet Take 20 mg by mouth 2  (two) times daily.   Yes [provider]  folic acid (FOLVITE) 1 MG tablet Take 1 mg by mouth daily.   Yes [provider]  hydrOXYzine (ATARAX/VISTARIL) 25 MG tablet Take 25 mg by mouth at bedtime.   Yes [provider]  methylphenidate 54 MG PO CR tablet Take 1 tablet (54 mg total) by mouth every morning. 01/11/19  Yes Deboraha Sprang, MD  polyethylene glycol (MIRALAX / GLYCOLAX) 17 g packet Take 17 g by mouth daily as needed for mild constipation.   Yes [provider]  Prenatal Vit-Fe Fumarate-FA (PRENATAL MULTIVITAMIN) TABS tablet Take 1 tablet by mouth daily at 12 noon.   Yes [provider]  Vitamin D, Ergocalciferol, (DRISDOL) 1.25 MG (50000 UT) CAPS capsule Take 50,000 Units by mouth every 7 (seven) days.   Yes [provider]  albuterol (PROVENTIL HFA;VENTOLIN HFA) 108 (90 Base) MCG/ACT inhaler Inhale into the lungs every 4 (four) hours as needed for wheezing.     [provider]  EPINEPHrine 0.3 mg/0.3 mL IJ SOAJ injection Inject 0.3 mLs (0.3 mg total) into the muscle as needed (anaphylaxis). 06/10/18   Mortis, Alvie Heidelberg I, PA-C  ondansetron (ZOFRAN) 4 MG tablet Take 1 tablet (4 mg total) by mouth every 8 (eight) hours as needed. 07/26/18   Tegeler, Gwenyth Allegra, MD   No results found.  Positive ROS: All other systems have been reviewed and were otherwise negative with the exception of those mentioned in the HPI and as above.  Physical Exam: General: Alert, no acute distress Cardiovascular: No pedal edema Respiratory: No cyanosis, no use of accessory musculature Skin: No lesions in the area of chief complaint Psychiatric: Patient is competent for consent with normal mood and affect Lymphatic: No axillary or cervical lymphadenopathy  MUSCULOSKELETAL: Inspection Right: no erythema, swelling, or warmth and normal appearance: right. Right wrist Palpation no tenderness radial styloid or snuffbox; no tenderness of the  radiocarpal joint or DRUJ; and no tenderness right wrist, at the scapholunate interval, along the first dorsal compartment, or distal radius; There is no tenderness to palpation about the right elbow including the cubital tunnel.. Range of Motion Right: Adequate wrist range of motion Right. Neurologic Testing Right Tinel's sign at the ulnar nerve positive, median nerve compression positive, Phalen's test positive, and Ulnar Nerve Compression Test at Elbow - positive and Ulnar Nerve Compression Test at palm - negative.  Assessment: R CTS, CuTS  Plan: Plan to proceed to OR for right CTR, CuTR with possible anterior transposition R/b/a discussed once again and consent obtained. Marked Plan for d/c home post op    Avanell Shackleton III, MD [phone]   07/28/2019 7:13 AM

## 2019-07-28 NOTE — Op Note (Signed)
PREOPERATIVE DIAGNOSIS: Right carpal tunnel syndrome, cubital tunnel syndrome  POSTOPERATIVE DIAGNOSIS: Same  ATTENDING PHYSICIAN: Maudry Mayhew. Jeannie Fend, III, MD who was present and scrubbed for the entire case   ASSISTANT SURGEON: None.   ANESTHESIA: Regional with MAC  SURGICAL PROCEDURES: 1.  Right carpal tunnel release 2.  Right cubital tunnel release, in situ decompression  SURGICAL INDICATIONS: Patient is a 38 year old female who was seen and evaluated by me in clinic.  She had a long history of right hand numbness and tingling.  She had a EMG which confirmed right carpal and cubital tunnel syndrome.  She underwent extensive nonoperative treatment including braces anti-inflammatory and activity modification.  Despite this she had continued symptoms that limited her usage of the arm on a daily basis.  After discussing treatment options the patient did wish to proceed with operative intervention and presents today for that.  FINDING: There was compression of the median nerve at the carpal tunnel.  Following carpal tunnel release there was intact median nerve as well as deep tendinous structures.  The ulnar nerve was also compressed by thickened Osborne's ligament.  Following cubital tunnel release the nerve is stable posterior to medial epicondyle and thus an in situ decompression was performed.  DESCRIPTION OF PROCEDURE: Patient was identified in the preoperative holding area where the risk benefits and alternatives of the procedure were discussed with the patient.  These include but are not limited to infection, bleeding, damage to surrounding structures including blood vessels nerves, pain, stiffness, recurrence and need for additional procedures.  Informed consent was obtained at that time and the patient's right arm was marked with a surgical marking pen.  She then underwent a right upper extremity plexus block by anesthesia.  She was then brought to the operative suite where timeout was  performed identifying the correct patient operative site.  She was positioned supine on the operative table with her hand outstretched on a hand table.  She was induced under MAC sedation.  A tourniquet was placed in the upper arm the arm was then prepped and draped in usual sterile fashion.  We began with the carpal tunnel release.  A longitudinal incision was made in the palm at the distal aspect of the transverse carpal ligament.  This was in line with the fourth ray.  Sharp dissection was carried down through the subcutaneous tissues.  The pulmonary upon neurosis was identified and split longitudinally.  Continued deep dissection was performed of the transverse fibers of the transverse carpal ligament were visualized.  A 15 blade was was used to carefully incise the ligament working distal to proximal.  Care was taken to fully release the distal aspect of the ligament with visualization of the palmar fat.  Continued release of the ligament was performed up to the level of the proximal skin incision.  The Indianatome's were then inserted in sequential fashion dilating both palmar and dorsal to the transverse carpal ligament.  The Biomet protected carpal tunnel release guide was then inserted and the blade was carefully advanced releasing the remaining portion of the proximal transverse carpal ligament.  Under direct visualization was found to have complete release of the ligament as well as intact tendinous structures as well as the median nerve.  The radial and ulnar leaflets were carefully elevated off the underlying tenosynovium.  The wound was copiously irrigated with normal saline and the skin was closed with interrupted 4-0 Prolene's.  Attention was then turned to the medial elbow.  An L-shaped incision was made  about the medial epicondyle.  Blunt dissection was carried down through the subcutaneous tissues.  Crossing superficial nerves were visualized and protected.  Working proximal to the medial  epicondyle the ulnar nerve was found and released from its overlying fascial constraints up to the level of the arcade of [place].  We continue to traced the nerve distally following it posterior to the medial epicondyle.  There is a thickened Osborne's ligament which was carefully released and was compressing the nerve.  Continued dissection distally was performed tracing the nerve as it entered the 2 heads of the FCU.  Both the superficial and deep fascia of the FCU muscle was released.  Full complete release of the nerve was performed proximally and distally to the level of the first motor branch to the FCU.  Following complete release of the nerve the elbow was flexed and extended and found the nerve is stable and remained posterior to the medial epicondyle.  At this point the wound was then copiously irrigated with normal saline.  The skin was closed with deep 3-0 Vicryl sutures followed by interrupted 4-0 Prolene sutures.  Xeroform and 4 x 4's were placed on both surgical wounds.  A well-padded long-arm splint was then placed.  The tourniquet was released and the patient had return of brisk capillary refill to all of her digits.  She was awoken from her sedation and taken to the PACU in stable condition.  She tolerated the procedure well and there are no complications.   ESTIMATED BLOOD LOSS:  <10 mls  TOURNIQUET TIME:  28 min  SPECIMENS: None  POSTOPERATIVE PLAN: The patient will be discharged home and seen back  in the office in approximately 10-12 days for wound check, suture  removal, and then be sent to a therapist for to begin elbow wrist and digit range of motion.  IMPLANTS: None

## 2019-07-28 NOTE — Anesthesia Procedure Notes (Signed)
Procedure Name: MAC Date/Time: 07/28/2019 7:47 AM Performed by: Kathryne Hitch, CRNA Pre-anesthesia Checklist: Patient identified, Suction available, Emergency Drugs available and Patient being monitored Patient Re-evaluated:Patient Re-evaluated prior to induction Oxygen Delivery Method: Simple face mask Preoxygenation: Pre-oxygenation with 100% oxygen Induction Type: IV induction Dental Injury: Teeth and Oropharynx as per pre-operative assessment

## 2019-07-29 ENCOUNTER — Encounter (HOSPITAL_COMMUNITY): Payer: Self-pay | Admitting: Orthopaedic Surgery

## 2019-08-02 DIAGNOSIS — G5601 Carpal tunnel syndrome, right upper limb: Secondary | ICD-10-CM | POA: Insufficient documentation

## 2019-08-18 ENCOUNTER — Ambulatory Visit (INDEPENDENT_AMBULATORY_CARE_PROVIDER_SITE_OTHER): Payer: 59 | Admitting: *Deleted

## 2019-08-18 DIAGNOSIS — I471 Supraventricular tachycardia: Secondary | ICD-10-CM | POA: Diagnosis not present

## 2019-08-18 DIAGNOSIS — R55 Syncope and collapse: Secondary | ICD-10-CM

## 2019-08-18 LAB — CUP PACEART REMOTE DEVICE CHECK
Battery Remaining Percentage: 55 %
Brady Statistic RA Percent Paced: 55 %
Brady Statistic RV Percent Paced: 0 %
Date Time Interrogation Session: 20201119072747
Implantable Lead Implant Date: 20130708
Implantable Lead Implant Date: 20130708
Implantable Lead Location: 753859
Implantable Lead Location: 753860
Implantable Lead Model: 350
Implantable Lead Model: 350
Implantable Lead Serial Number: 29222139
Implantable Lead Serial Number: 29231638
Implantable Pulse Generator Implant Date: 20130708
Lead Channel Impedance Value: 507 Ohm
Lead Channel Impedance Value: 507 Ohm
Lead Channel Impedance Value: 507 Ohm
Lead Channel Sensing Intrinsic Amplitude: 1.6 mV
Lead Channel Sensing Intrinsic Amplitude: 9.2 mV
Lead Channel Setting Pacing Amplitude: 2 V
Lead Channel Setting Pacing Amplitude: 2.4 V
Lead Channel Setting Pacing Pulse Width: 0.4 ms
Pulse Gen Serial Number: 66303845

## 2019-09-02 ENCOUNTER — Telehealth: Payer: Self-pay | Admitting: Emergency Medicine

## 2019-09-02 NOTE — Telephone Encounter (Signed)
Biotronik alert for 11 HVR reported this morning on transmission. Patient reports no CP, SOB, palpitations, dizziness or syncope. No EGMs available. She will contact device clinic if she has any of the above mentioned cardiac symptoms.

## 2019-09-16 NOTE — Progress Notes (Signed)
Remote pacemaker transmission.   

## 2019-10-14 NOTE — Telephone Encounter (Signed)
MRI form received, completed, and faxed back to [facility] Scheduling. Confirmation received.

## 2019-11-03 ENCOUNTER — Other Ambulatory Visit: Payer: Self-pay

## 2019-11-03 ENCOUNTER — Telehealth: Payer: Self-pay | Admitting: Internal Medicine

## 2019-11-03 NOTE — Telephone Encounter (Signed)
Medication is a controlled substance and it will not let me send it electronically. It has to be printed out and signed by Dr. Caryl Comes. Please address

## 2019-11-03 NOTE — Telephone Encounter (Signed)
Will refill Thanks SK

## 2019-11-03 NOTE — Telephone Encounter (Signed)
This medication has to be printed out and sign by Dr. Caryl Comes. Thanks

## 2019-11-03 NOTE — Telephone Encounter (Signed)
*  STAT* If patient is at the pharmacy, call can be transferred to refill team.   1. Which medications need to be refilled? (please list name of each medication and dose if known) need a written prescription for Concerta- she will pick them up  2. Which pharmacy/location (including street and city if local pharmacy) is medication to be sent to?  3. Do they need a 30 day or 90 day supply? 30 days- he usually write  3 prescriptions at a time

## 2019-11-03 NOTE — Telephone Encounter (Signed)
Pt is calling requesting a refill on methylphenidate (Concerta), printed out for pt to pick up at the office. Would Dr. Caryl Comes like to refill this medication? Please address

## 2019-11-04 ENCOUNTER — Other Ambulatory Visit: Payer: Self-pay

## 2019-11-09 NOTE — Telephone Encounter (Signed)
Discussed with pt 2/8. Dr. Caryl Comes to provide paper prescription(s) for no longer than 3 months at most.

## 2019-11-17 ENCOUNTER — Ambulatory Visit (INDEPENDENT_AMBULATORY_CARE_PROVIDER_SITE_OTHER): Payer: 59 | Admitting: *Deleted

## 2019-11-17 DIAGNOSIS — Z950 Presence of cardiac pacemaker: Secondary | ICD-10-CM

## 2019-11-17 LAB — CUP PACEART REMOTE DEVICE CHECK
Battery Remaining Percentage: 50 %
Brady Statistic RA Percent Paced: 54 %
Brady Statistic RV Percent Paced: 0 %
Date Time Interrogation Session: 20210218073044
Implantable Lead Implant Date: 20130708
Implantable Lead Implant Date: 20130708
Implantable Lead Location: 753859
Implantable Lead Location: 753860
Implantable Lead Model: 350
Implantable Lead Model: 350
Implantable Lead Serial Number: 29222139
Implantable Lead Serial Number: 29231638
Implantable Pulse Generator Implant Date: 20130708
Lead Channel Impedance Value: 507 Ohm
Lead Channel Impedance Value: 507 Ohm
Lead Channel Pacing Threshold Amplitude: 1 V
Lead Channel Pacing Threshold Pulse Width: 0.4 ms
Lead Channel Sensing Intrinsic Amplitude: 13.4 mV
Lead Channel Sensing Intrinsic Amplitude: 3 mV
Lead Channel Setting Pacing Amplitude: 2 V
Lead Channel Setting Pacing Amplitude: 2.4 V
Lead Channel Setting Pacing Pulse Width: 0.4 ms
Pulse Gen Serial Number: 66303845

## 2019-11-17 NOTE — Telephone Encounter (Signed)
Medication has been filled. Electronic script denied and encounter can be closed.

## 2019-11-17 NOTE — Telephone Encounter (Signed)
Pt received refill via mail. Electronic script denied. MD to sign.

## 2019-11-17 NOTE — Progress Notes (Signed)
PPM Remote  

## 2019-11-25 ENCOUNTER — Telehealth: Payer: Self-pay

## 2019-11-25 NOTE — Telephone Encounter (Signed)
Alert received for increase in HVR- 23 episodes yesterday, longest episode under 5 minutes.  No EGMs available.    Spoke with pt, She denies any cardiac symptoms or any changes yesterday.  She does recall that she woke up several times last night and today feels sluggish but not much more than usual.    Advised will forward info to MD, anticipate continued monitoring.

## 2019-11-29 NOTE — Telephone Encounter (Signed)
Noted  

## 2019-12-24 ENCOUNTER — Emergency Department (HOSPITAL_BASED_OUTPATIENT_CLINIC_OR_DEPARTMENT_OTHER): Payer: 59

## 2019-12-24 ENCOUNTER — Other Ambulatory Visit: Payer: Self-pay

## 2019-12-24 ENCOUNTER — Encounter (HOSPITAL_BASED_OUTPATIENT_CLINIC_OR_DEPARTMENT_OTHER): Payer: Self-pay | Admitting: Emergency Medicine

## 2019-12-24 ENCOUNTER — Emergency Department (HOSPITAL_BASED_OUTPATIENT_CLINIC_OR_DEPARTMENT_OTHER)
Admission: EM | Admit: 2019-12-24 | Discharge: 2019-12-24 | Disposition: A | Discharge status: Home / Self Care | Payer: 59 | Attending: Emergency Medicine | Admitting: Emergency Medicine

## 2019-12-24 DIAGNOSIS — S80211A Abrasion, right knee, initial encounter: Secondary | ICD-10-CM | POA: Diagnosis not present

## 2019-12-24 DIAGNOSIS — I10 Essential (primary) hypertension: Secondary | ICD-10-CM | POA: Insufficient documentation

## 2019-12-24 DIAGNOSIS — Y9389 Activity, other specified: Secondary | ICD-10-CM | POA: Diagnosis not present

## 2019-12-24 DIAGNOSIS — M25521 Pain in right elbow: Secondary | ICD-10-CM | POA: Insufficient documentation

## 2019-12-24 DIAGNOSIS — T148XXA Other injury of unspecified body region, initial encounter: Secondary | ICD-10-CM

## 2019-12-24 DIAGNOSIS — Y999 Unspecified external cause status: Secondary | ICD-10-CM | POA: Diagnosis not present

## 2019-12-24 DIAGNOSIS — Y9289 Other specified places as the place of occurrence of the external cause: Secondary | ICD-10-CM | POA: Insufficient documentation

## 2019-12-24 DIAGNOSIS — R531 Weakness: Secondary | ICD-10-CM | POA: Diagnosis not present

## 2019-12-24 DIAGNOSIS — M25572 Pain in left ankle and joints of left foot: Secondary | ICD-10-CM | POA: Insufficient documentation

## 2019-12-24 DIAGNOSIS — Z79899 Other long term (current) drug therapy: Secondary | ICD-10-CM | POA: Diagnosis not present

## 2019-12-24 DIAGNOSIS — R209 Unspecified disturbances of skin sensation: Secondary | ICD-10-CM | POA: Diagnosis not present

## 2019-12-24 DIAGNOSIS — W19XXXA Unspecified fall, initial encounter: Secondary | ICD-10-CM

## 2019-12-24 DIAGNOSIS — Z950 Presence of cardiac pacemaker: Secondary | ICD-10-CM | POA: Insufficient documentation

## 2019-12-24 DIAGNOSIS — W010XXA Fall on same level from slipping, tripping and stumbling without subsequent striking against object, initial encounter: Secondary | ICD-10-CM | POA: Insufficient documentation

## 2019-12-24 DIAGNOSIS — M25531 Pain in right wrist: Secondary | ICD-10-CM | POA: Diagnosis not present

## 2019-12-24 DIAGNOSIS — S8991XA Unspecified injury of right lower leg, initial encounter: Secondary | ICD-10-CM | POA: Diagnosis present

## 2019-12-24 NOTE — Discharge Instructions (Signed)

## 2019-12-24 NOTE — ED Triage Notes (Signed)
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 Pt states pain to her left ankle, right knee and right arm

## 2019-12-24 NOTE — ED Provider Notes (Signed)
[facility] EMERGENCY DEPARTMENT Provider Note   CSN: [id number] Arrival date & time: 12/24/19  1523     History Chief Complaint  Patient presents with  . Fall    [facility] is a 39 y.o. female with a past medical history of neurocardiogenic syncope with pacemaker pneumonia, right carpal tunnel release and right cubital tunnel release who presents today for evaluation after mechanical nonsyncopal fall.  She was working at the [employer] when she tripped over a hump in the carpet causing her to fall.  She denies striking her head or passing out.  She reports pain in her left ankle, right arm, and right knee. She states that she has decreased sensation through her entire right arm from the elbow down.  She states that it feels like it did before she had her carpal tunnel and cubital tunnel surgery.  She also reports feeling like her left ankle is numb. When she attempted to stand she had pain in her left ankle and right knee.  She reports that her left ankle rolled when she fell.    HPI     Past Medical History:  Diagnosis Date  . Anemia   . Depletion of volume of extracellular fluid   . 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 Essential hypertension 04/20/2013  . GERD (gastroesophageal reflux disease)   . Neurocardiogenic syncope   . Neurocardiogenic syncope 2003  . Neurologic cardiac syncope   . 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 Vitamin B12 deficiency 07/28/2018    Patient Active Problem List   Diagnosis Date Noted  . Vitamin B12 deficiency 07/28/2018  . Heart palpitations 01/03/2016  . 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 Essential hypertension 04/20/2013  . 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 ABDOMINAL HYSTERECTOMY    . CESAREAN SECTION    . CHOLECYSTECTOMY    . hysterectomy    . LAPAROSCOPIC ROUX-EN-Y GASTRIC BYPASS WITH UPPER ENDOSCOPY AND REMOVAL OF LAP BAND    . PACEMAKER INSERTION    . PVC  ABLATION N/A 03/12/2018   Procedure: PVC ABLATION;  Surgeon: Evans Lance, MD;  Location: [facility];  Service: Cardiovascular;  Laterality: N/A;  . TONSILLECTOMY AND ADENOIDECTOMY    . TUBAL LIGATION    . ULNAR TUNNEL RELEASE Right 07/28/2019   Procedure: Right carpal tunnel release, right cubital tunnel release;  Surgeon: Verner Mould, MD;  Location: [facility];  Service: Orthopedics;  Laterality: Right;     OB History   No obstetric history on file.     Family History  Problem Relation Age of Onset  . Diabetes Mother   . Hyperlipidemia Mother   . Hypertension Father   . Heart disease Father   . Hyperlipidemia Father   . Mental illness Sister   . Cancer Maternal Grandmother   . Hyperlipidemia Maternal Grandmother   . Heart disease Maternal Grandfather   . Hyperlipidemia Maternal Grandfather   . Hyperlipidemia Paternal Grandmother   . Hypertension Paternal Grandmother   . Heart disease Paternal Grandfather   . Hyperlipidemia Paternal Grandfather     Social History   Tobacco Use  . Smoking status: Never Smoker  . Smokeless tobacco: Never Used  Substance Use Topics  . Alcohol use: No    Comment: occassionally  . Drug use: Never    Home Medications Prior to Admission medications   Medication Sig Start Date End Date Taking? Authorizing Provider  albuterol (PROVENTIL HFA;VENTOLIN HFA) 108 (  90 Base) MCG/ACT inhaler Inhale into the lungs every 4 (four) hours as needed for wheezing.     [provider]  budesonide-formoterol (SYMBICORT) 160-4.5 MCG/ACT inhaler Inhale 2 puffs into the lungs 2 (two) times daily as needed (shortness of breath).    [provider]  cetirizine (ZYRTEC) 10 MG tablet Take 10 mg by mouth 2 (two) times daily.    [provider]  cyanocobalamin (,VITAMIN B-12,) 1000 MCG/ML injection Inject 1,000 mcg into the muscle once a week.     [provider]  EPINEPHrine 0.3 mg/0.3 mL IJ SOAJ injection Inject  0.3 mLs (0.3 mg total) into the muscle as needed (anaphylaxis). 06/10/18   Mortis, Alvie Heidelberg I, PA-C  famotidine (PEPCID) 20 MG tablet Take 20 mg by mouth 2 (two) times daily.    [provider]  folic acid (FOLVITE) 1 MG tablet Take 1 mg by mouth daily.    [provider]  HYDROcodone-acetaminophen (NORCO/VICODIN) 5-325 MG tablet Take 1-2 tablets by mouth every 6 (six) hours as needed for moderate pain. 07/28/19   Avanell Shackleton III, MD  hydrOXYzine (ATARAX/VISTARIL) 25 MG tablet Take 25 mg by mouth at bedtime.    [provider]  methylphenidate 54 MG PO CR tablet Take 1 tablet (54 mg total) by mouth every morning. 01/11/19   Deboraha Sprang, MD  ondansetron (ZOFRAN) 4 MG tablet Take 1 tablet (4 mg total) by mouth every 8 (eight) hours as needed. 07/28/19   Avanell Shackleton III, MD  polyethylene glycol (MIRALAX / GLYCOLAX) 17 g packet Take 17 g by mouth daily as needed for mild constipation.    [provider]  Prenatal Vit-Fe Fumarate-FA (PRENATAL MULTIVITAMIN) TABS tablet Take 1 tablet by mouth daily at 12 noon.    [provider]  Vitamin D, Ergocalciferol, (DRISDOL) 1.25 MG (50000 UT) CAPS capsule Take 50,000 Units by mouth every 7 (seven) days.    [provider]    Allergies    Cinnamon, Eggs or egg-derived products, Flu virus vaccine, Polysorbate, Pork-derived products, Sulfa antibiotics, Amoxicillin-pot clavulanate, and Sulfamethoxazole  Review of Systems   Review of Systems  Constitutional: Negative for chills and fever.  Respiratory: Negative for shortness of breath.   Cardiovascular: Negative for chest pain.  Gastrointestinal: Negative for abdominal pain.  Musculoskeletal: Negative for back pain and neck pain.       Pain in left ankle, right knee.   Skin:       Abrasion on right knee  Neurological: Negative for syncope, weakness and headaches.  All other systems reviewed and are negative.   Physical Exam Updated  Vital Signs BP 119/60 (BP Location: Left Arm)   Pulse 74   Temp 99.1 F (37.3 C) (Oral)   Resp 14   Ht 5\' 10"  (1.778 m)   Wt 113.4 kg   SpO2 100%   BMI 35.87 kg/m   Physical Exam Vitals and nursing note reviewed.  Constitutional:      General: She is not in acute distress.    Appearance: She is well-developed. She is not diaphoretic.  HENT:     Head: Normocephalic and atraumatic.     Right Ear: Tympanic membrane, ear canal and external ear normal.     Left Ear: Tympanic membrane, ear canal and external ear normal.  Eyes:     General: No scleral icterus.       Right eye: No discharge.        Left eye: No discharge.  Conjunctiva/sclera: Conjunctivae normal.  Cardiovascular:     Rate and Rhythm: Normal rate and regular rhythm.     Pulses:          Radial pulses are 1+ on the right side and 1+ on the left side.       Dorsalis pedis pulses are 1+ on the right side and 1+ on the left side.  Pulmonary:     Effort: Pulmonary effort is normal. No respiratory distress.     Breath sounds: No stridor.  Abdominal:     General: There is no distension.  Musculoskeletal:        General: No deformity.     Cervical back: Normal range of motion.     Comments: Mild edema lateral aspect of the right ankle along the lateral malleolus.  There is no obvious crepitus or deformity.  Patient has full range of motion.  Skin:    General: Skin is warm and dry.     Comments: 2 cm x 3 cm abrasion right anterior knee  Neurological:     Mental Status: She is alert.     Motor: No abnormal muscle tone.     Comments: Subjective decreased sensation to light touch right arm from the elbow distally.  She is able to toes which think touching intact light touch with her eyes closed and she is able to feel pressure.  Strength is 4/5 in right hand.  Left ankle with subjective decreased sensation across the foot from distal.  Strength is intact to bilateral dorsiflexion and plantarflexion.  Psychiatric:         Behavior: Behavior normal.     ED Results / Procedures / Treatments   Labs (all labs ordered are listed, but only abnormal results are displayed) Labs Reviewed - No data to display  EKG None  Radiology DG Elbow Complete Right  Result Date: 12/24/2019 CLINICAL DATA:  Pain status post fall EXAM: RIGHT ELBOW - COMPLETE 3+ VIEW COMPARISON:  None. FINDINGS: There is no evidence of fracture, dislocation, or joint effusion. There is no evidence of arthropathy or other focal bone abnormality. Soft tissues are unremarkable. IMPRESSION: Negative. Electronically Signed   By: Constance Holster M.D.   On: 12/24/2019 16:48   DG Wrist Complete Right  Result Date: 12/24/2019 CLINICAL DATA:  Pain EXAM: RIGHT WRIST - COMPLETE 3+ VIEW COMPARISON:  None. FINDINGS: There is no evidence of fracture or dislocation. There is no evidence of arthropathy or other focal bone abnormality. Soft tissues are unremarkable. IMPRESSION: Negative. Electronically Signed   By: Constance Holster M.D.   On: 12/24/2019 16:50   DG Ankle Complete Left  Result Date: 12/24/2019 CLINICAL DATA:  Fall and numbness. EXAM: LEFT ANKLE COMPLETE - 3+ VIEW COMPARISON:  None. FINDINGS: There is no evidence of fracture, dislocation, or joint effusion. There is no evidence of arthropathy or other focal bone abnormality. Soft tissues are unremarkable. IMPRESSION: Negative. Electronically Signed   By: Constance Holster M.D.   On: 12/24/2019 16:52   DG Knee Complete 4 Views Right  Result Date: 12/24/2019 CLINICAL DATA:  Pain status post fall EXAM: RIGHT KNEE - COMPLETE 4+ VIEW COMPARISON:  None. FINDINGS: No evidence of fracture, dislocation, or joint effusion. No evidence of arthropathy or other focal bone abnormality. Soft tissues are unremarkable. IMPRESSION: Negative. Electronically Signed   By: Constance Holster M.D.   On: 12/24/2019 16:51    Procedures Procedures (including critical care time)  Medications Ordered in  ED Medications - No data  to display  ED Course  I have reviewed the triage vital signs and the nursing notes.  Pertinent labs & imaging results that were available during my care of the patient were reviewed by me and considered in my medical decision making (see chart for details).    MDM Rules/Calculators/A&P                     Patient is a 39 year old woman who presents today for evaluation after a nonsyncopal, mechanical fall.  She previously had carpal tunnel and cubital tunnel release of her right arm, she reports that she now feels like the area is decreased sensation and weak which is like it was before her surgery.  X-rays were obtained without evidence of acute abnormality.  She does have some weakness in addition to her paresthesias however again reports that this is consistent with her presurgical state.  She is able to tell that she is being touched however it is overall decreased.  Given that there were no fractures I suspect nerve irritation from the fall.    X-rays of the left ankle were obtained without evidence of fracture.  She was given a cam walker as she felt like with an ASO or any other brace her ankle was weak and wanted to give out.  Recently she reported decreased subjective sensation in the left ankle.  She did not strike her head.  On exam she does not have any pain in her head, her neck or back.  She has full active range of motion of her head without pain or difficulty.  I suspect that her multiple areas of decreased sensation and peripheral rather than central given her lack of central symptoms.  Recommended conservative care including follow-up with her hand surgeon if her symptoms do not resolve by Monday and her primary care doctor regarding her ankle.  She was offered crutches which she refused.  Recommended  rice, OTC pain medications.  Return precautions were discussed with patient who states their understanding.  At the time of discharge patient denied any  unaddressed complaints or concerns.  Patient is agreeable for discharge home.  Note: Portions of this report may have been transcribed using voice recognition software. Every effort was made to ensure accuracy; however, inadvertent computerized transcription errors may be present  Final Clinical Impression(s) / ED Diagnoses Final diagnoses:  Fall, initial encounter  Abnormal sensation of upper extremity  Abnormal sensation of lower extremity  Abrasion    Rx / DC Orders ED Discharge Orders    None       Lorin Glass, PA-C 12/25/19 0007    Malvin Johns, MD 12/25/19 [phone]

## 2019-12-24 NOTE — ED Notes (Signed)
Pt discharged to home. Discharge instructions have been discussed with patient and/or family members. Pt verbally acknowledges understanding d/c instructions, and states understanding to checkout at registration before leaving.

## 2019-12-24 NOTE — ED Notes (Signed)
UDS Completed

## 2020-01-23 ENCOUNTER — Emergency Department (HOSPITAL_BASED_OUTPATIENT_CLINIC_OR_DEPARTMENT_OTHER)
Admission: EM | Admit: 2020-01-23 | Discharge: 2020-01-23 | Disposition: A | Discharge status: Home / Self Care | Payer: 59 | Attending: Emergency Medicine | Admitting: Emergency Medicine

## 2020-01-23 ENCOUNTER — Other Ambulatory Visit: Payer: Self-pay

## 2020-01-23 ENCOUNTER — Encounter (HOSPITAL_BASED_OUTPATIENT_CLINIC_OR_DEPARTMENT_OTHER): Payer: Self-pay | Admitting: *Deleted

## 2020-01-23 DIAGNOSIS — Z91018 Allergy to other foods: Secondary | ICD-10-CM | POA: Diagnosis not present

## 2020-01-23 DIAGNOSIS — Z950 Presence of cardiac pacemaker: Secondary | ICD-10-CM | POA: Insufficient documentation

## 2020-01-23 DIAGNOSIS — Z91012 Allergy to eggs: Secondary | ICD-10-CM | POA: Insufficient documentation

## 2020-01-23 DIAGNOSIS — Z887 Allergy status to serum and vaccine status: Secondary | ICD-10-CM | POA: Insufficient documentation

## 2020-01-23 DIAGNOSIS — Z79899 Other long term (current) drug therapy: Secondary | ICD-10-CM | POA: Diagnosis not present

## 2020-01-23 DIAGNOSIS — Z882 Allergy status to sulfonamides status: Secondary | ICD-10-CM | POA: Insufficient documentation

## 2020-01-23 DIAGNOSIS — Z880 Allergy status to penicillin: Secondary | ICD-10-CM | POA: Insufficient documentation

## 2020-01-23 DIAGNOSIS — I10 Essential (primary) hypertension: Secondary | ICD-10-CM | POA: Insufficient documentation

## 2020-01-23 DIAGNOSIS — T7840XA Allergy, unspecified, initial encounter: Secondary | ICD-10-CM | POA: Insufficient documentation

## 2020-01-23 DIAGNOSIS — R220 Localized swelling, mass and lump, head: Secondary | ICD-10-CM | POA: Diagnosis present

## 2020-01-23 LAB — BASIC METABOLIC PANEL WITH GFR
Anion gap: 7 (ref 5–15)
BUN: 13 mg/dL (ref 6–20)
CO2: 25 mmol/L (ref 22–32)
Calcium: 8.5 mg/dL — ABNORMAL LOW (ref 8.9–10.3)
Chloride: 106 mmol/L (ref 98–111)
Creatinine, Ser: 0.72 mg/dL (ref 0.44–1.00)
GFR calc Af Amer: >60 mL/min
GFR calc non Af Amer: >60 mL/min
Glucose, Bld: 115 mg/dL — ABNORMAL HIGH (ref 70–99)
Potassium: 3.9 mmol/L (ref 3.5–5.1)
Sodium: 138 mmol/L (ref 135–145)

## 2020-01-23 LAB — CBC
HCT: 44.8 % (ref 36.0–46.0)
Hemoglobin: 15.1 g/dL — ABNORMAL HIGH (ref 12.0–15.0)
MCH: 30.9 pg (ref 26.0–34.0)
MCHC: 33.7 g/dL (ref 30.0–36.0)
MCV: 91.8 fL (ref 80.0–100.0)
Platelets: 205 10*3/uL (ref 150–400)
RBC: 4.88 MIL/uL (ref 3.87–5.11)
RDW: 12.6 % (ref 11.5–15.5)
WBC: 6.4 10*3/uL (ref 4.0–10.5)
nRBC: 0 % (ref 0.0–0.2)

## 2020-01-23 LAB — PREGNANCY, URINE: Preg Test, Ur: NEGATIVE

## 2020-01-23 MED ORDER — FAMOTIDINE IN NACL 20-0.9 MG/50ML-% IV SOLN
20.0000 mg | Freq: Once | INTRAVENOUS | Status: AC
  Administered 2020-01-23: 20 mg via INTRAVENOUS
  Filled 2020-01-23: qty 50

## 2020-01-23 MED ORDER — EPINEPHRINE 0.3 MG/0.3ML IJ SOAJ
0.3000 mg | Freq: Once | INTRAMUSCULAR | 1 refills | Status: DC | PRN
Start: 2020-01-23 — End: 2022-01-10

## 2020-01-23 MED ORDER — METHYLPREDNISOLONE 4 MG PO TBPK
ORAL_TABLET | ORAL | 0 refills | Status: DC
Start: 2020-01-23 — End: 2020-01-23

## 2020-01-23 MED ORDER — FAMOTIDINE 20 MG PO TABS
20.0000 mg | ORAL_TABLET | Freq: Two times a day (BID) | ORAL | 0 refills | Status: DC
Start: 2020-01-23 — End: 2021-10-11

## 2020-01-23 MED ORDER — METHYLPREDNISOLONE 4 MG PO TBPK
ORAL_TABLET | ORAL | 0 refills | Status: DC
Start: 2020-01-23 — End: 2020-03-20

## 2020-01-23 MED ORDER — METHYLPREDNISOLONE SODIUM SUCC 125 MG IJ SOLR
125.0000 mg | Freq: Once | INTRAMUSCULAR | Status: AC
  Administered 2020-01-23: 13:00:00 125 mg via INTRAVENOUS
  Filled 2020-01-23: qty 2

## 2020-01-23 MED ORDER — FAMOTIDINE 20 MG PO TABS
20.0000 mg | ORAL_TABLET | Freq: Two times a day (BID) | ORAL | 0 refills | Status: DC
Start: 2020-01-23 — End: 2020-01-23

## 2020-01-23 MED ORDER — DIPHENHYDRAMINE HCL 50 MG/ML IJ SOLN
25.0000 mg | Freq: Once | INTRAMUSCULAR | Status: AC
  Administered 2020-01-23: 13:00:00 25 mg via INTRAVENOUS
  Filled 2020-01-23: qty 1

## 2020-01-23 NOTE — ED Provider Notes (Signed)
[facility] Provider Note   CSN: [id number] Arrival date & time: 01/23/20  1145     History Chief Complaint  Patient presents with  . Allergic Reaction    Erin [facility] is a 39 y.o. female with a past medical history of neurocardiogenic syncope and dysautonomia status post pacemaker placement.  Patient had a recent sinus infection and was prescribed Levaquin on Saturday, 01/21/2020.  The patient had an allergy to polysorbate.  This is contained in the make-up of the Levaquin tablet.  Patient began having GI symptoms after taking the medication.  Today she had onset of facial swelling, lip, tongue, uvula and throat swelling with associated wheezing while at work.  She was given epinephrine by EMS.  She also took Benadryl.  She states that she still feels like she is having some tightness with her breathing and throat swelling.  She denies any loss of consciousness or hive formation.  HPI     Past Medical History:  Diagnosis Date  . Anemia   . Depletion of volume of extracellular fluid   . 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 Essential hypertension 04/20/2013  . GERD (gastroesophageal reflux disease)   . Neurocardiogenic syncope   . Neurocardiogenic syncope 2003  . Neurologic cardiac syncope   . 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 Vitamin B12 deficiency 07/28/2018    Patient Active Problem List   Diagnosis Date Noted  . Vitamin B12 deficiency 07/28/2018  . Heart palpitations 01/03/2016  . 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 Essential hypertension 04/20/2013  . 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 ABDOMINAL HYSTERECTOMY    . CESAREAN SECTION    . CHOLECYSTECTOMY    . hysterectomy    . LAPAROSCOPIC ROUX-EN-Y GASTRIC BYPASS WITH UPPER ENDOSCOPY AND REMOVAL OF LAP BAND    . PACEMAKER INSERTION    . PVC ABLATION N/A 03/12/2018   Procedure: PVC ABLATION;  Surgeon: Evans Lance, MD;  Location: [facility];  Service: Cardiovascular;  Laterality: N/A;  . TONSILLECTOMY AND ADENOIDECTOMY    . TUBAL LIGATION    . ULNAR TUNNEL RELEASE Right 07/28/2019   Procedure: Right carpal tunnel release, right cubital tunnel release;  Surgeon: Verner Mould, MD;  Location: [facility];  Service: Orthopedics;  Laterality: Right;     OB History    Gravida  1   Para      Term      Preterm      AB      Living        SAB      TAB      Ectopic      Multiple      Live Births              Family History  Problem Relation Age of Onset  . Diabetes Mother   . Hyperlipidemia Mother   . Hypertension Father   . Heart disease Father   . Hyperlipidemia Father   . Mental illness Sister   . Cancer Maternal Grandmother   . Hyperlipidemia Maternal Grandmother   . Heart disease Maternal Grandfather   . Hyperlipidemia Maternal Grandfather   . Hyperlipidemia Paternal Grandmother   . Hypertension Paternal Grandmother   . Heart disease Paternal Grandfather   . Hyperlipidemia Paternal Grandfather     Social History   Tobacco Use  . Smoking status: Never Smoker  . Smokeless tobacco: Never Used  Substance Use Topics  .  Alcohol use: Yes    Comment: occassionally  . Drug use: Never    Home Medications Prior to Admission medications   Medication Sig Start Date End Date Taking? Authorizing Provider  cetirizine (ZYRTEC) 10 MG tablet Take 10 mg by mouth 2 (two) times daily.   Yes [provider]  EPINEPHrine 0.3 mg/0.3 mL IJ SOAJ injection Inject 0.3 mLs (0.3 mg total) into the muscle as needed (anaphylaxis). 06/10/18  Yes Mortis, Jonelle Sports, PA-C  famotidine (PEPCID) 20 MG tablet Take 20 mg by mouth 2 (two) times daily.   Yes [provider]  folic acid (FOLVITE) 1 MG tablet Take 1 mg by mouth daily.   Yes [provider]  methylphenidate 54 MG PO CR tablet Take 1 tablet (54 mg total) by mouth every morning. 01/11/19  Yes Deboraha Sprang, MD  Prenatal  Vit-Fe Fumarate-FA (PRENATAL MULTIVITAMIN) TABS tablet Take 1 tablet by mouth daily at 12 noon.   Yes [provider]  albuterol (PROVENTIL HFA;VENTOLIN HFA) 108 (90 Base) MCG/ACT inhaler Inhale into the lungs every 4 (four) hours as needed for wheezing.     [provider]  budesonide-formoterol (SYMBICORT) 160-4.5 MCG/ACT inhaler Inhale 2 puffs into the lungs 2 (two) times daily as needed (shortness of breath).    [provider]  cyanocobalamin (,VITAMIN B-12,) 1000 MCG/ML injection Inject 1,000 mcg into the muscle once a week.     [provider]  ondansetron (ZOFRAN) 4 MG tablet Take 1 tablet (4 mg total) by mouth every 8 (eight) hours as needed. 07/28/19   Avanell Shackleton III, MD  polyethylene glycol (MIRALAX / GLYCOLAX) 17 g packet Take 17 g by mouth daily as needed for mild constipation.    [provider]  Vitamin D, Ergocalciferol, (DRISDOL) 1.25 MG (50000 UT) CAPS capsule Take 50,000 Units by mouth every 7 (seven) days.    [provider]    Allergies    Cinnamon, Eggs or egg-derived products, Flu virus vaccine, Levaquin [levofloxacin], Polysorbate, Pork-derived products, Sulfa antibiotics, Amoxicillin-pot clavulanate, and Sulfamethoxazole  Review of Systems   Review of Systems Ten systems reviewed and are negative for acute change, except as noted in the HPI.  Physical Exam Updated Vital Signs BP 119/71 (BP Location: Left Arm)   Pulse 80   Temp 98.7 F (37.1 C) (Oral)   Resp 15   Ht 5' 10.5" (1.791 m)   Wt 124.1 kg   SpO2 98%   BMI 38.70 kg/m   Physical Exam Vitals and nursing note reviewed.  Constitutional:      General: She is not in acute distress.    Appearance: She is well-developed. She is not diaphoretic.  HENT:     Head: Normocephalic and atraumatic.     Mouth/Throat:   Eyes:     General: No scleral icterus.    Conjunctiva/sclera: Conjunctivae normal.  Cardiovascular:     Rate and Rhythm: Normal rate  and regular rhythm.     Heart sounds: Normal heart sounds. No murmur. No friction rub. No gallop.   Pulmonary:     Effort: Pulmonary effort is normal. No respiratory distress.     Breath sounds: Normal breath sounds. No wheezing.  Abdominal:     General: Bowel sounds are normal. There is no distension.     Palpations: Abdomen is soft. There is no mass.     Tenderness: There is no abdominal tenderness. There is no guarding.  Musculoskeletal:     Cervical back: Normal  range of motion.  Skin:    General: Skin is warm and dry.     Findings: No rash. Rash is not urticarial.  Neurological:     Mental Status: She is alert and oriented to person, place, and time.  Psychiatric:        Behavior: Behavior normal.     ED Results / Procedures / Treatments   Labs (all labs ordered are listed, but only abnormal results are displayed) Labs Reviewed  PREGNANCY, URINE  BASIC METABOLIC PANEL  CBC    EKG None  Radiology No results found.  Procedures Procedures (including critical care time)  Medications Ordered in ED Medications  methylPREDNISolone sodium succinate (SOLU-MEDROL) 125 mg/2 mL injection 125 mg (has no administration in time range)  famotidine (PEPCID) IVPB 20 mg premix (has no administration in time range)  diphenhydrAMINE (BENADRYL) injection 25 mg (has no administration in time range)    ED Course  I have reviewed the triage vital signs and the nursing notes.  Pertinent labs & imaging results that were available during my care of the patient were reviewed by me and considered in my medical decision making (see chart for details).    MDM Rules/Calculators/A&P                      Final Clinical Impression(s) / ED Diagnoses Final diagnoses:  None   This is a 39 year old female with a history of multiple allergies who presents for allergic reaction.  Patient feels that this was related to her Levaquin use.  She felt swelling in her mouth tongue and throat and some  tightness in her chest.  Patient took epinephrine prior to arrival..  She had no obvious signs or symptoms of acute allergic reaction or anaphylaxis on my examination after getting Benadryl and epinephrine.  Patient was given Pepcid and Solu-Medrol here in the emergency department and observed for several hours.  She did complain that her chest felt tight but again had great air movement, speaking in full sentences, no wheezing.  She was seen and shared visit with Dr. Maurie Boettcher who assessed the patient well felt that she was safe for discharge at this time.  Patient advised to avoid medications that might cause allergy containing her known allergy shins.  She is advised to follow-up with her PCP.  I discussed return precautions.  She appears appropriate for discharge at this time. Rx / DC Orders ED Discharge Orders    None       Margarita Mail, PA-C 01/23/20 1509    Maudie Flakes, MD 01/25/20 [phone]

## 2020-01-23 NOTE — ED Triage Notes (Signed)
Patient was prescribed with Levaquin for sinus infection.  She took one this past Saturday, Sunday and started to have an abdominal cramping and today, she took one again and and around 1100, she has a chest pain, breathing difficulty and facial swelling .

## 2020-01-23 NOTE — ED Notes (Signed)
Swelling to bilateral lower eyelids and patient voiced that it feels tight.

## 2020-01-23 NOTE — Discharge Instructions (Signed)
Get help right away if: You have symptoms of anaphylaxis, such as: Swollen mouth, tongue, or throat. Pain or tightness in your chest. Trouble breathing or shortness of breath. Dizziness or fainting. Severe abdominal pain, vomiting, or diarrhea.

## 2020-02-16 ENCOUNTER — Ambulatory Visit (INDEPENDENT_AMBULATORY_CARE_PROVIDER_SITE_OTHER): Payer: 59 | Admitting: *Deleted

## 2020-02-16 DIAGNOSIS — R55 Syncope and collapse: Secondary | ICD-10-CM

## 2020-02-16 LAB — CUP PACEART REMOTE DEVICE CHECK
Battery Remaining Percentage: 50 %
Brady Statistic RA Percent Paced: 54 %
Brady Statistic RV Percent Paced: 0 %
Date Time Interrogation Session: 20210520063946
Implantable Lead Implant Date: 20130708
Implantable Lead Implant Date: 20130708
Implantable Lead Location: 753859
Implantable Lead Location: 753860
Implantable Lead Model: 350
Implantable Lead Model: 350
Implantable Lead Serial Number: 29222139
Implantable Lead Serial Number: 29231638
Implantable Pulse Generator Implant Date: 20130708
Lead Channel Impedance Value: 488 Ohm
Lead Channel Impedance Value: 488 Ohm
Lead Channel Pacing Threshold Amplitude: 1.2 V
Lead Channel Pacing Threshold Pulse Width: 0.4 ms
Lead Channel Sensing Intrinsic Amplitude: 1.6 mV
Lead Channel Sensing Intrinsic Amplitude: 9.6 mV
Lead Channel Setting Pacing Amplitude: 2 V
Lead Channel Setting Pacing Amplitude: 2.4 V
Lead Channel Setting Pacing Pulse Width: 0.4 ms
Pulse Gen Serial Number: 66303845

## 2020-02-20 ENCOUNTER — Telehealth: Payer: Self-pay | Admitting: Internal Medicine

## 2020-02-20 NOTE — Progress Notes (Signed)
Remote pacemaker transmission.   

## 2020-02-20 NOTE — Telephone Encounter (Signed)
See pt message encounter 02/20/2020.  Scripts written for May June and July 2021.  Placed downstairs for pt to pick up.

## 2020-02-20 NOTE — Telephone Encounter (Signed)
  Pt c/o medication issue:  1. Name of Medication: methylphenidate 54 MG PO CR tablet  2. How are you currently taking this medication (dosage and times per day)? 1 tablet by mouth daily  3. Are you having a reaction (difficulty breathing--STAT)? No   4. What is your medication issue? [facility] is calling needing a refill on her methylphenidate 54 MG PO CR tablet.  She states this is a prescription she comes to the office to pick due to it being a controlled substance. She only has 2 tablets left and was hoping to pick it up today. Please advise.

## 2020-03-03 MED ORDER — GENERIC EXTERNAL MEDICATION
Status: DC
Start: ? — End: 2020-03-03

## 2020-03-03 MED ORDER — SODIUM CHLORIDE 0.9 % IV SOLN
10.00 | INTRAVENOUS | Status: DC
Start: ? — End: 2020-03-03

## 2020-03-06 DIAGNOSIS — R030 Elevated blood-pressure reading, without diagnosis of hypertension: Secondary | ICD-10-CM | POA: Insufficient documentation

## 2020-03-06 DIAGNOSIS — Z6839 Body mass index (BMI) 39.0-39.9, adult: Secondary | ICD-10-CM | POA: Insufficient documentation

## 2020-03-06 DIAGNOSIS — M545 Low back pain, unspecified: Secondary | ICD-10-CM | POA: Insufficient documentation

## 2020-03-20 ENCOUNTER — Encounter: Payer: Self-pay | Admitting: Internal Medicine

## 2020-03-20 ENCOUNTER — Ambulatory Visit: Payer: 59 | Admitting: Internal Medicine

## 2020-03-20 ENCOUNTER — Other Ambulatory Visit: Payer: Self-pay

## 2020-03-20 VITALS — BP 120/82 | HR 89 | Ht 70.5 in | Wt 273.0 lb

## 2020-03-20 DIAGNOSIS — L680 Hirsutism: Secondary | ICD-10-CM

## 2020-03-20 DIAGNOSIS — D3502 Benign neoplasm of left adrenal gland: Secondary | ICD-10-CM | POA: Insufficient documentation

## 2020-03-20 NOTE — Patient Instructions (Signed)

## 2020-03-20 NOTE — Progress Notes (Signed)
Name: Erin Casey  MRN/ DOB: [id number], 05/16/1981    Age/ Sex: 39 y.o., female    PCP: [name], MD   Reason for Endocrinology Evaluation: Adrenal adenoma     Date of Initial Endocrinology Evaluation: 03/20/2020     HPI: [name] is a 39 y.o. female with a past medical history of Neurocardiogenic Syncope and hx of VT (S/P pacemaker placement) , and Vitamin B12 deficiency. The patient presented for initial endocrinology clinic visit on 03/20/2020 for consultative assistance with her Adrenal adenoma.      Pt follows with cardiology for a neurocardiogenic syncope and Hx of VT's . She has a pacemaker in place. Pt on methylphenidate for hypotension.     Pt was noted to have an incidental finding of 1.6 cm left adrenal adenoma during imaging for back pain sustained secondary to a fall.     Adrenal incidentaloma: Substantial weight gain- yes . Gained 8 lbs in 1 week Centripetal obesity- Yes Easy bruisbility- no Severe hypertension- No DM- no Proximal muscle weakness -no Fatigue- yes  Sudden/ severe headaches- has occasional more frequent headache Weight loss- no  Anxiety attacks- no Sweating- yes  Cardiac arrhythmias- yes neurocardiogenic syncope, PVC  Palpitations- yes Fluid retention- yes Hypokalemia- once in 2019     Has noted excessive hair growth around the chin area, she had hystrectomy due to excessive bleeding at age 27. Ovaries are intact. The hair growth was noted ~ 2 months ago. Shaves daily. No excessive growth any where else.    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   Vitamin B12 deficiency 07/28/2018   Past Surgical History:  Past Surgical History:  Procedure Laterality Date   ABDOMINAL HYSTERECTOMY     CESAREAN SECTION     CHOLECYSTECTOMY     hysterectomy     LAPAROSCOPIC ROUX-EN-Y GASTRIC BYPASS WITH UPPER ENDOSCOPY AND REMOVAL OF LAP BAND     PACEMAKER INSERTION     PVC ABLATION N/A 03/12/2018   Procedure: PVC ABLATION;  Surgeon: Evans Lance, MD;  Location: [facility];  Service: Cardiovascular;  Laterality: N/A;   TONSILLECTOMY AND ADENOIDECTOMY     TUBAL LIGATION     ULNAR TUNNEL RELEASE Right 07/28/2019   Procedure: Right carpal tunnel release, right cubital tunnel release;  Surgeon: Verner Mould, MD;  Location: [facility];  Service: Orthopedics;  Laterality: Right;      Social History:  reports that she has never smoked. She has never used smokeless tobacco. She reports current alcohol use. She reports that she does not use drugs.  Family History: family history includes Cancer in her maternal grandmother; Diabetes in her mother; Heart disease in her father, maternal grandfather, and paternal grandfather; Hyperlipidemia in her father, maternal grandfather, maternal grandmother, mother, paternal grandfather, and paternal grandmother; Hypertension in her father and paternal grandmother; Mental illness in her sister.   HOME MEDICATIONS: Allergies as of 03/20/2020      Reactions   Cinnamon Anaphylaxis   Eggs Or Egg-derived Products Anaphylaxis  Flu Virus Vaccine Anaphylaxis   Levaquin [levofloxacin] Anaphylaxis   Polysorbate Anaphylaxis   Pork-derived Products Anaphylaxis   Sulfa Antibiotics Hives, Nausea Only, Rash   Amoxicillin-pot Clavulanate Other (See Comments)   [Derm] [GI]   Sulfamethoxazole Nausea And Vomiting, Rash      Medication List       Accurate as of March 20, 2020  3:10 PM. If you have any questions, ask your nurse or doctor.        STOP  taking these medications   methylPREDNISolone 4 MG Tbpk tablet Commonly known as: MEDROL DOSEPAK Stopped by: Dorita Sciara, MD     TAKE these medications   albuterol 108 (90 Base) MCG/ACT inhaler Commonly known as: VENTOLIN HFA Inhale into the lungs every 4 (four) hours as needed for wheezing.   budesonide-formoterol 160-4.5 MCG/ACT inhaler Commonly known as: SYMBICORT Inhale 2 puffs into the lungs 2 (two) times daily as needed (shortness of breath).   cetirizine 10 MG tablet Commonly known as: ZYRTEC Take 10 mg by mouth 2 (two) times daily.   cyanocobalamin 1000 MCG/ML injection Commonly known as: (VITAMIN B-12) Inject 1,000 mcg into the muscle once a week.   EPINEPHrine 0.3 mg/0.3 mL Soaj injection Commonly known as: EPI-PEN Inject 0.3 mLs (0.3 mg total) into the muscle as needed (anaphylaxis).   EPINEPHrine 0.3 mg/0.3 mL Soaj injection Commonly known as: EpiPen 2-Pak Inject 0.3 mLs (0.3 mg total) into the muscle once as needed for up to 1 dose (for severe allergic reaction). CAll 911 immediately if you have to use this medicine   famotidine 20 MG tablet Commonly known as: Pepcid Take 1 tablet (20 mg total) by mouth 2 (two) times daily. What changed: Another medication with the same name was removed. Continue taking this medication, and follow the directions you see here. Changed by: Dorita Sciara, MD   folic acid 1 MG tablet Commonly known as: FOLVITE Take 1 mg by mouth daily.   methylphenidate 54 MG CR tablet Commonly known as: CONCERTA Take 1 tablet (54 mg total) by mouth every morning.   ondansetron 4 MG tablet Commonly known as: ZOFRAN Take 1 tablet (4 mg total) by mouth every 8 (eight) hours as needed.   polyethylene glycol 17 g packet Commonly known as: MIRALAX / GLYCOLAX Take 17 g by mouth daily as needed for mild constipation.   prenatal multivitamin Tabs tablet Take 1 tablet by mouth daily at 12 noon.   Vitamin D (Ergocalciferol) 1.25  MG (50000 UNIT) Caps capsule Commonly known as: DRISDOL Take 50,000 Units by mouth every 7 (seven) days.         REVIEW OF SYSTEMS: A comprehensive ROS was conducted with the patient and is negative except as per HPI     OBJECTIVE:  VS: BP 120/82 (BP Location: Right Arm, Patient Position: Standing, Cuff Size: Large)    Pulse 89    Ht 5' 10.5" (1.791 m)    Wt 273 lb (123.8 kg)    SpO2 92%    BMI 38.62 kg/m    Wt Readings from Last 3 Encounters:  03/20/20 273 lb (123.8 kg)  01/23/20 273 lb 9.6 oz (124.1 kg)  12/24/19 250 lb (113.4 kg)    EXAM: General: Pt appears well and is in NAD  Eyes: External eye exam normal without stare, lid lag or exophthalmos.  EOM intact.    Neck: General: Supple without adenopathy. Thyroid: Thyroid size normal.  No goiter or nodules appreciated. No thyroid bruit.  Lungs: Clear with good BS bilat with no rales, rhonchi, or wheezes  Heart: Auscultation: RRR.  Abdomen: Normoactive bowel sounds, soft, nontender, without masses or organomegaly palpable  Extremities:  BL LE: No pretibial edema normal ROM and strength.  Skin: Hair: Texture and amount normal with gender appropriate distribution Skin Inspection: Thick dark hairs under the chin, rest of hair is normal  Neuro: Cranial nerves: II - XII grossly intact   Mental Status: Judgment, insight: Intact Orientation: Oriented to time, place, and person Mood and affect: No depression, anxiety, or agitation     DATA REVIEWED: Results for RORY, MONTEL (MRN [id number]) as of 03/21/2020 10:31  Ref. Range 03/20/2020 15:09  DHEA-SO4 Latest Ref Range: 23 - 266 mcg/dL 143  Results for REET, SCHARRER (MRN [id number]) as of 03/21/2020 12:37  Ref. Range 03/20/2020 15:09  Potassium Latest Ref Range: 3.5 - 5.1 mEq/L 3.9   Results for LEANZA, SHEPPERSON (MRN [id number]) as of 03/21/2020 09:04  Ref. Range 01/23/2020 12:37  Sodium Latest Ref Range: 135 - 145 mmol/L 138  Potassium Latest Ref Range: 3.5 - 5.1 mmol/L 3.9   Chloride Latest Ref Range: 98 - 111 mmol/L 106  CO2 Latest Ref Range: 22 - 32 mmol/L 25  Glucose Latest Ref Range: 70 - 99 mg/dL 115 (H)  BUN Latest Ref Range: 6 - 20 mg/dL 13  Creatinine Latest Ref Range: 0.44 - 1.00 mg/dL 0.72  Calcium Latest Ref Range: 8.9 - 10.3 mg/dL 8.5 (L)  Anion gap Latest Ref Range: 5 - 15  7  GFR, Est Non African American Latest Ref Range: >60 mL/min >60  GFR, Est African American Latest Ref Range: >60 mL/min >60     10/10/2019 Na 138 K 4.4 BUN/Cr 10/0.74  Alk phos 45 A1c 5.4%   ASSESSMENT/PLAN/RECOMMENDATIONS:   1. Left adrenal adenoma :   Eighty five percent of adrenal adenomas are nonsecretory.  - Three forms of adrenal hyperfunction should be considered in patients with adrenal incidentaloma   Glucocorticoid hypersecretion  Primary hyperaldosteronism  Pheochromocytoma  - Will proceed with testing of the above   2. Hirsutism :  -  DHEAS is normal. Testosterone - pending  - Discussed first line of treatment is combined oral contraceptive pills and spironolactone - Pt is not a candidate for COC due to father with Hx PE and DVT as personal hx of superficial thrombosis . - She is not a candidate for Spironolactone due to hypotension. I have discussed with her that using local cosmetic options especially that the area is so localized will be better as the risk outweighs the benefit given the above .     F/U in 6 months     Signed electronically by: Mack Guise, MD  [facility] End[facility]  [address] [place], [place] [address] Phone: [phone] FAX: [phone]   CC: [name], MD [place] [address] Phone: [phone] Fax: [phone]   Return to Endocrinology clinic as below: Future Appointments  Date Time Provider [place]  05/17/2020  7:25 AM [facility] DEVICE REMOTES [facility]  05/22/2020  7:15 AM [facility]  ECHO 3 [facility]  07/24/2020  3:30 PM Deboraha Sprang, MD [facility]  08/16/2020  7:25 AM [facility] DEVICE REMOTES [facility]

## 2020-03-21 ENCOUNTER — Encounter: Payer: Self-pay | Admitting: Internal Medicine

## 2020-03-21 LAB — POTASSIUM: Potassium: 3.9 mEq/L (ref 3.5–5.1)

## 2020-03-26 ENCOUNTER — Other Ambulatory Visit: Payer: 59

## 2020-03-26 ENCOUNTER — Other Ambulatory Visit: Payer: Self-pay

## 2020-03-26 DIAGNOSIS — D3502 Benign neoplasm of left adrenal gland: Secondary | ICD-10-CM

## 2020-03-26 LAB — ALDOSTERONE + RENIN ACTIVITY W/ RATIO
ALDO / PRA Ratio: 3.8 Ratio (ref 0.9–28.9)
Aldosterone: 7 ng/dL
Renin Activity: 1.85 ng/mL/h (ref 0.25–5.82)

## 2020-03-26 LAB — DHEA-SULFATE: DHEA-SO4: 143 ug/dL (ref 23–266)

## 2020-03-26 LAB — TESTOSTERONE, TOTAL, LC/MS/MS: Testosterone, Total, LC-MS-MS: 35 ng/dL (ref 2–45)

## 2020-03-28 ENCOUNTER — Other Ambulatory Visit: Payer: 59

## 2020-03-29 LAB — CORTISOL, URINE, 24 HOUR
24 Hour urine volume (VMAHVA): 2450 mL
Cortisol (Ur), Free: 15.8 mcg/24 h (ref 4.0–50.0)
RESULTS RECEIVED: 1.64 g/(24.h) (ref 0.50–2.15)

## 2020-03-30 LAB — METANEPHRINES, URINE, 24 HOUR
Metaneph Total, Ur: 414 mcg/24 h (ref 115–695)
Metanephrines, Ur: 97 mcg/24 h (ref 36–190)
Normetanephrine, 24H Ur: 317 mcg/24 h (ref 35–482)
Volume, Urine-VMAUR: 2450 mL

## 2020-04-24 ENCOUNTER — Telehealth: Payer: Self-pay | Admitting: Internal Medicine

## 2020-04-24 NOTE — Telephone Encounter (Signed)
Pt called to report that she is still having a low BP and feeling tired and lightheaded.   She says the BP cuff would not register this morning.   She finally got a reading of 90/50. She says she is not dehydrated and she drinks plenty of fluid... she has tried the liquid IV and has not had any benefit.   She says she has seen her PCP last week and they were not sure what is the underlying cause of her low BP. She says she does not have a general cardiologist.   She also says that when she was in the ED for this problem last week they told her they could not interrogate her pacemaker. She says she os concerned about her Pacemaker and maybe her settings are attributing to her issues.   I advised her that I will forward to our device clinic and to Dr. Caryl Comes for review and advice.

## 2020-04-24 NOTE — Telephone Encounter (Signed)
° °  Pt c/o BP issue: STAT if pt c/o blurred vision, one-sided weakness or slurred speech  1. What are your last 5 BP readings? 90/50  2. Are you having any other symptoms (ex. Dizziness, headache, blurred vision, passed out)? Dizzy when she sits up and stands up, she sees black spots.    3. What is your BP issue? Patient states she was in the ER last week because her BP was low and she passed out.  They told her she was dehydrated, so she is not sure why it keeps going low.  When she first wakes up her machine won't register her BP reading after drinking Gatorade and resting her BP was 90/50.

## 2020-04-25 ENCOUNTER — Telehealth: Payer: Self-pay | Admitting: Emergency Medicine

## 2020-04-25 NOTE — Telephone Encounter (Signed)
Biotronik transmissions showed device function WNL. No alerts received since Feb 14, 2020. Patient contacted and will follow up with PCP for hypotension.

## 2020-04-25 NOTE — Telephone Encounter (Signed)
Opened in error

## 2020-04-26 NOTE — Telephone Encounter (Signed)
A Thanks   Shouldn't be the device  Heat may be aggravating  any change in her periods? With her BP low can try florinef 0.1 bid and see if that helps   she would neec a bmet in about 2 weeks  Sk

## 2020-04-26 NOTE — Telephone Encounter (Signed)
Spoke with pt and advised per Dr Caryl Comes device should not be causing problem.  Pt states she has had a hysterectomy so no longer has periods.  Pt declines trying Rx of Florinef 0.1mg  for B/P as it has caused fluid retention in the past.  Pt has been trying the liquid IV; however it too causes some fluid retention in her legs.  Pt denies CP or SOB at this time and states she does feel some better today.  Pt thanked Therapist, sports for call.

## 2020-05-17 ENCOUNTER — Ambulatory Visit (INDEPENDENT_AMBULATORY_CARE_PROVIDER_SITE_OTHER): Payer: 59 | Admitting: *Deleted

## 2020-05-17 DIAGNOSIS — I471 Supraventricular tachycardia: Secondary | ICD-10-CM | POA: Diagnosis not present

## 2020-05-17 LAB — CUP PACEART REMOTE DEVICE CHECK
Battery Remaining Percentage: 50 %
Brady Statistic RA Percent Paced: 54 %
Brady Statistic RV Percent Paced: 0 %
Date Time Interrogation Session: 20210819093426
Implantable Lead Implant Date: 20130708
Implantable Lead Implant Date: 20130708
Implantable Lead Location: 753859
Implantable Lead Location: 753860
Implantable Lead Model: 350
Implantable Lead Model: 350
Implantable Lead Serial Number: 29222139
Implantable Lead Serial Number: 29231638
Implantable Pulse Generator Implant Date: 20130708
Lead Channel Impedance Value: 507 Ohm
Lead Channel Impedance Value: 507 Ohm
Lead Channel Pacing Threshold Amplitude: 0.6 V
Lead Channel Pacing Threshold Amplitude: 1 V
Lead Channel Pacing Threshold Pulse Width: 0.4 ms
Lead Channel Pacing Threshold Pulse Width: 0.4 ms
Lead Channel Sensing Intrinsic Amplitude: 1.6 mV
Lead Channel Sensing Intrinsic Amplitude: 10 mV
Lead Channel Setting Pacing Amplitude: 2 V
Lead Channel Setting Pacing Amplitude: 2.4 V
Lead Channel Setting Pacing Pulse Width: 0.4 ms
Pulse Gen Serial Number: 66303845

## 2020-05-21 NOTE — Progress Notes (Signed)
Remote pacemaker transmission.   

## 2020-05-22 ENCOUNTER — Other Ambulatory Visit (HOSPITAL_COMMUNITY): Payer: 59

## 2020-05-31 ENCOUNTER — Other Ambulatory Visit (HOSPITAL_COMMUNITY): Payer: 59

## 2020-05-31 ENCOUNTER — Encounter (HOSPITAL_COMMUNITY): Payer: Self-pay | Admitting: Student

## 2020-06-11 ENCOUNTER — Telehealth (HOSPITAL_COMMUNITY): Payer: Self-pay | Admitting: Student

## 2020-06-11 NOTE — Telephone Encounter (Signed)
Just an FYI. We have made several attempts to contact this patient including sending a letter to schedule or reschedule their echocardiogram. We will be removing the patient from the echo [facility].  05/31/20 Called and [facility] to reschedule @ 9:45 and MAILED LETTER/[facility]  05/31/2020 Pt No Showed ECHO      Thank you 

## 2020-06-14 DIAGNOSIS — E278 Other specified disorders of adrenal gland: Secondary | ICD-10-CM | POA: Insufficient documentation

## 2020-06-22 ENCOUNTER — Other Ambulatory Visit (HOSPITAL_COMMUNITY): Payer: 59

## 2020-07-13 ENCOUNTER — Telehealth: Payer: Self-pay

## 2020-07-13 NOTE — Telephone Encounter (Signed)
Biotronik alert received 07/13/20 for 13 HVR, 1 minute 20 seconds in duration on 07/12/20. No EMG available.   Called patient to assess. Patient states she woke up a lot last night not feeling well and sweating. Patient states she feels fine today. Patient has follow-up with Dr. Caryl Comes 07/24/20, patient reminded of apt. Patient advised if she has any further questions or concerns to please call the device clinic. Verbalized understanding.

## 2020-07-16 ENCOUNTER — Ambulatory Visit (HOSPITAL_COMMUNITY): Payer: 59 | Attending: Cardiology

## 2020-07-16 ENCOUNTER — Encounter (HOSPITAL_COMMUNITY): Payer: Self-pay

## 2020-07-16 ENCOUNTER — Telehealth (HOSPITAL_COMMUNITY): Payer: Self-pay | Admitting: Student

## 2020-07-16 NOTE — Progress Notes (Signed)
Verified appointment "no show" status with M. Tatham at [facility].

## 2020-07-16 NOTE — Telephone Encounter (Signed)
Thank you, she has an appointment with Dr. Caryl Comes 10/26 and they can discuss,  Thank you much!  Legrand Como [address] [place], PA-C  07/16/2020 1:52 PM

## 2020-07-16 NOTE — Telephone Encounter (Signed)
Just an FYI. We have made several attempts to contact this patient including sending a letter to schedule or reschedule their echocardiogram. We will be removing the patient from the echo [place].  05/31/20 Called and [facility] to reschedule @ 9:45 and MAILED LETTER/[facility]  05/31/2020 Pt No Showed ECHO      Thank you

## 2020-07-19 DIAGNOSIS — I4729 Other ventricular tachycardia: Secondary | ICD-10-CM | POA: Insufficient documentation

## 2020-07-19 DIAGNOSIS — I472 Ventricular tachycardia: Secondary | ICD-10-CM | POA: Insufficient documentation

## 2020-07-24 ENCOUNTER — Ambulatory Visit (INDEPENDENT_AMBULATORY_CARE_PROVIDER_SITE_OTHER): Payer: 59 | Admitting: Internal Medicine

## 2020-07-24 ENCOUNTER — Encounter: Payer: Self-pay | Admitting: Internal Medicine

## 2020-07-24 ENCOUNTER — Other Ambulatory Visit: Payer: Self-pay

## 2020-07-24 VITALS — BP 128/70 | HR 101 | Ht 70.0 in | Wt 270.0 lb

## 2020-07-24 DIAGNOSIS — I4729 Other ventricular tachycardia: Secondary | ICD-10-CM

## 2020-07-24 DIAGNOSIS — R002 Palpitations: Secondary | ICD-10-CM

## 2020-07-24 DIAGNOSIS — R55 Syncope and collapse: Secondary | ICD-10-CM

## 2020-07-24 DIAGNOSIS — I472 Ventricular tachycardia: Secondary | ICD-10-CM | POA: Diagnosis not present

## 2020-07-24 NOTE — Patient Instructions (Addendum)
Medication Instructions:  Your physician recommends that you continue on your current medications as directed. Please refer to the Current Medication list given to you today.  *If you need a refill on your cardiac medications before your next appointment, please call your pharmacy*   Lab Work: None ordered.  If you have labs (blood work) drawn today and your tests are completely normal, you will receive your results only by: . MyChart Message (if you have MyChart) OR . A paper copy in the mail If you have any lab test that is abnormal or we need to change your treatment, we will call you to review the results.   Testing/Procedures: Your physician has requested that you have an echocardiogram. Echocardiography is a painless test that uses sound waves to create images of your heart. It provides your doctor with information about the size and shape of your heart and how well your heart's chambers and valves are working. This procedure takes approximately one hour. There are no restrictions for this procedure.      Follow-Up: At [facility], you and your health needs are our priority.  As part of our continuing mission to provide you with exceptional heart care, we have created designated Provider Care Teams.  These Care Teams include your primary Cardiologist (physician) and Advanced Practice Providers (APPs -  Physician Assistants and Nurse Practitioners) who all work together to provide you with the care you need, when you need it.  We recommend signing up for the patient portal called "MyChart".  Sign up information is provided on this After Visit Summary.  MyChart is used to connect with patients for Virtual Visits (Telemedicine).  Patients are able to view lab/test results, encounter notes, upcoming appointments, etc.  Non-urgent messages can be sent to your provider as well.   To learn more about what you can do with MyChart, go to [email or web address].    Your next appointment:    12 months  The format for your next appointment:   In Person  Provider:   Steven Klein, MD     

## 2020-07-24 NOTE — Telephone Encounter (Signed)
Seen in iffice

## 2020-07-24 NOTE — Progress Notes (Signed)
Patient Care Team: [name], MD as PCP - General (Family Medicine) Harold Hedge, Darrick Grinder, MD as Consulting Physician (Allergy and Immunology)   HPI  Erin Casey is a 39 y.o. female Seen in follow-up for a Biotronik CLS pacemaker([employer] with [facility]) implanted for symptomatic neurocardiogenic syncope.  With this, she has significant improvement in her overall symptoms.  There is no interval dizziness or syncope.  Recurrent lightheadedness and palpitations and seen at [facility] by Dr. Rosita Fire who noted PVCs.  There are a couple of arrhythmic sequelae which she tried to address by decreasing the PVC triggers; use flecainide which she not tolerate well.  She has not tolerated the flecainide well.  Night she saw Dr. Elliot Cousin and underwent EP testing.  She was noninducible.  She has been doing exceedingly well.  She has had no interval syncope.  Minimal orthostatic intolerance.  Diet is well tolerated.  Today.  But has tolerated heat relatively well.  She is taking new job with [employer].  Loves it.  She is here today as she was called because of an alert from October 14-15 associated with 13 HVR events for duration of greater than 1 minute.     She has developed a left upper extremity DVT related to infusion of incompatible drugs apparently.   Had a short-term course of Xarelto  Date Cr K Hgb  4/21 0.72 3.9 15.1              Past Medical History:  Diagnosis Date  . Anemia   . Depletion of volume of extracellular fluid   . Dysautonomia ([place]) 2003  . Essential hypertension 04/20/2013  . GERD (gastroesophageal reflux disease)   . Neurocardiogenic syncope   . Neurocardiogenic syncope 2003  . Neurologic cardiac syncope   . 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 Vitamin B12 deficiency 07/28/2018    Past Surgical History:  Procedure Laterality Date  . ABDOMINAL HYSTERECTOMY    . CESAREAN SECTION    . CHOLECYSTECTOMY    . hysterectomy    . LAPAROSCOPIC  GASTRIC BANDING     With reversal   . PACEMAKER INSERTION    . PVC ABLATION N/A 03/12/2018   Procedure: PVC ABLATION;  Surgeon: Evans Lance, MD;  Location: [facility];  Service: Cardiovascular;  Laterality: N/A;  . TONSILLECTOMY AND ADENOIDECTOMY    . TUBAL LIGATION    . ULNAR TUNNEL RELEASE Right 07/28/2019   Procedure: Right carpal tunnel release, right cubital tunnel release;  Surgeon: Verner Mould, MD;  Location: [place];  Service: Orthopedics;  Laterality: Right;    Current Outpatient Medications  Medication Sig Dispense Refill  . albuterol (PROVENTIL HFA;VENTOLIN HFA) 108 (90 Base) MCG/ACT inhaler Inhale into the lungs every 4 (four) hours as needed for wheezing.     . budesonide-formoterol (SYMBICORT) 160-4.5 MCG/ACT inhaler Inhale 2 puffs into the lungs 2 (two) times daily as needed (shortness of breath).    . cetirizine (ZYRTEC) 10 MG tablet Take 10 mg by mouth 2 (two) times daily.    . cyanocobalamin (,VITAMIN B-12,) 1000 MCG/ML injection Inject 1,000 mcg into the muscle every 14 (fourteen) days.     Marland Kitchen EPINEPHrine (EPIPEN 2-PAK) 0.3 mg/0.3 mL IJ SOAJ injection Inject 0.3 mLs (0.3 mg total) into the muscle once as needed for up to 1 dose (for severe allergic reaction). CAll 911 immediately if you have to use this medicine 1 each 1  . famotidine (PEPCID)  20 MG tablet Take 1 tablet (20 mg total) by mouth 2 (two) times daily. 10 tablet 0  . methylphenidate 54 MG PO CR tablet Take 1 tablet (54 mg total) by mouth every morning. 90 tablet 0  . ondansetron (ZOFRAN) 4 MG tablet Take 1 tablet (4 mg total) by mouth every 8 (eight) hours as needed. 30 tablet 0  . Prenatal Vit-Fe Fumarate-FA (PRENATAL MULTIVITAMIN) TABS tablet Take 1 tablet by mouth daily at 12 noon.    . Vitamin D, Ergocalciferol, (DRISDOL) 1.25 MG (50000 UT) CAPS capsule Take 50,000 Units by mouth every 7 (seven) days.     No current facility-administered medications for this visit.    Allergies    Allergen Reactions  . Cinnamon Anaphylaxis  . Crab Extract Allergy Skin Test Anaphylaxis  . Eggs Or Egg-Derived Products Anaphylaxis  . Flu Virus Vaccine Anaphylaxis  . Levaquin [Levofloxacin] Anaphylaxis  . Pineapple Rash    Swelling, rash, GI upset  . Polysorbate Anaphylaxis  . Pork-Derived Products Anaphylaxis  . Sulfa Antibiotics Hives, Nausea Only and Rash  . Amoxicillin-Pot Clavulanate Other (See Comments)    [Derm] [GI]  . Sulfamethoxazole Nausea And Vomiting and Rash      Review of Systems negative except from HPI and PMH  Physical Exam BP 128/70   Pulse (!) 101   Ht 5\' 10"  (1.778 m)   Wt 270 lb (122.5 kg)   SpO2 98%   BMI 38.74 kg/m  Well developed and well nourished in no acute distress HENT normal Neck supple with JVP-flat Clear Device pocket well healed; without hematoma or erythema.  There is no tethering  Regular rate and rhythm, no   murmur Abd-soft with active BS No Clubbing cyanosis   edema Skin-warm and dry A & Oriented  Grossly normal sensory and motor function  ECG sinus at 101 Intervals 15/08/35 Otherwise normal  Assessment and  Plan  SVT  VT- NS/PVCs noninducible at EP study 6/19  Pacemaker -Biotronik/CLS The patient's device was interrogated and the information was fully reviewed.  The device was reprogrammed to make more aggressive CLS  Neurocardiogenic syncope/dysautonomia     Recurrent high ventricular rate events.  Overall heart rate histogram is unchanged over the last 3-4 years.  Given the shift to the right, we will check an echocardiogram to make sure that there is no consequence to the left ventricular function.  It may be partly related to her stimulants.  It may also be her norm.  We have a discrepancy in the data set from the transmitted alert on 10/15 identified 15 events over time with greater than 1 minute to the interrogation of the device today demonstrating a single event-#12-for total duration of 04 seconds.  We are  working with Biotronik to try to clarify this distinction.  From a dysautonomia point of view however, she is doing exceedingly well.  Her daughter is now 4 and a Paramedic.  Playing tennis.

## 2020-08-15 ENCOUNTER — Ambulatory Visit (HOSPITAL_COMMUNITY): Payer: 59 | Attending: Internal Medicine

## 2020-08-15 ENCOUNTER — Other Ambulatory Visit: Payer: Self-pay

## 2020-08-15 DIAGNOSIS — I4729 Other ventricular tachycardia: Secondary | ICD-10-CM

## 2020-08-15 DIAGNOSIS — R002 Palpitations: Secondary | ICD-10-CM | POA: Diagnosis not present

## 2020-08-15 DIAGNOSIS — I472 Ventricular tachycardia: Secondary | ICD-10-CM | POA: Diagnosis present

## 2020-08-15 LAB — ECHOCARDIOGRAM COMPLETE
Area-P 1/2: 3.42 cm2
S' Lateral: 3.1 cm

## 2020-08-15 MED ORDER — PERFLUTREN LIPID MICROSPHERE
1.0000 mL | INTRAVENOUS | Status: AC | PRN
  Administered 2020-08-15: 1 mL via INTRAVENOUS

## 2020-08-16 ENCOUNTER — Telehealth: Payer: Self-pay

## 2020-08-16 ENCOUNTER — Ambulatory Visit (INDEPENDENT_AMBULATORY_CARE_PROVIDER_SITE_OTHER): Payer: 59

## 2020-08-16 DIAGNOSIS — I4729 Other ventricular tachycardia: Secondary | ICD-10-CM

## 2020-08-16 DIAGNOSIS — I472 Ventricular tachycardia: Secondary | ICD-10-CM | POA: Diagnosis not present

## 2020-08-16 LAB — CUP PACEART REMOTE DEVICE CHECK
Battery Remaining Percentage: 45 %
Brady Statistic RA Percent Paced: 53 %
Brady Statistic RV Percent Paced: 0 %
Date Time Interrogation Session: 20211118063320
Implantable Lead Implant Date: 20130708
Implantable Lead Implant Date: 20130708
Implantable Lead Location: 753859
Implantable Lead Location: 753860
Implantable Lead Model: 350
Implantable Lead Model: 350
Implantable Lead Serial Number: 29222139
Implantable Lead Serial Number: 29231638
Implantable Pulse Generator Implant Date: 20130708
Lead Channel Impedance Value: 488 Ohm
Lead Channel Impedance Value: 507 Ohm
Lead Channel Pacing Threshold Amplitude: 0.7 V
Lead Channel Pacing Threshold Amplitude: 1 V
Lead Channel Pacing Threshold Pulse Width: 0.4 ms
Lead Channel Pacing Threshold Pulse Width: 0.4 ms
Lead Channel Sensing Intrinsic Amplitude: 12.5 mV
Lead Channel Sensing Intrinsic Amplitude: 3.3 mV
Lead Channel Setting Pacing Amplitude: 2 V
Lead Channel Setting Pacing Amplitude: 2.4 V
Lead Channel Setting Pacing Pulse Width: 0.4 ms
Pulse Gen Serial Number: 66303845

## 2020-08-16 NOTE — Telephone Encounter (Signed)
Pt is aware and agreeable to normal echo results

## 2020-08-16 NOTE — Telephone Encounter (Signed)
-----   Message from Shirley Friar, PA-C sent at 08/15/2020 12:11 PM EST ----- Echo shows normal LV function. Please let her know this is great news.  Will forward to Dr. Caryl Comes as Juluis Rainier. Legrand Como [address]" [place], PA-C  08/15/2020 12:10 PM

## 2020-08-17 ENCOUNTER — Telehealth: Payer: Self-pay

## 2020-08-17 NOTE — Telephone Encounter (Signed)
Spoke with pt and advised in order to follow up on her older device information that was previously overridden d/t new episodes she will need to be seen in the office by Biotronik rep for RAM memory dump.  Appointment scheduled for 08/21/2020 at 130.  Pt verbalizes understanding and agrees with current plan.

## 2020-08-20 NOTE — Progress Notes (Signed)
Remote pacemaker transmission.   

## 2020-08-21 ENCOUNTER — Other Ambulatory Visit: Payer: Self-pay

## 2020-08-21 ENCOUNTER — Ambulatory Visit (INDEPENDENT_AMBULATORY_CARE_PROVIDER_SITE_OTHER): Payer: 59 | Admitting: Internal Medicine

## 2020-08-21 DIAGNOSIS — R55 Syncope and collapse: Secondary | ICD-10-CM

## 2020-08-21 LAB — CUP PACEART INCLINIC DEVICE CHECK
Brady Statistic RA Percent Paced: 53 %
Brady Statistic RV Percent Paced: 0 %
Date Time Interrogation Session: 20211123145519
Implantable Lead Implant Date: 20130708
Implantable Lead Implant Date: 20130708
Implantable Lead Location: 753859
Implantable Lead Location: 753860
Implantable Lead Model: 350
Implantable Lead Model: 350
Implantable Lead Serial Number: 29222139
Implantable Lead Serial Number: 29231638
Implantable Pulse Generator Implant Date: 20130708
Lead Channel Impedance Value: 526 Ohm
Lead Channel Impedance Value: 526 Ohm
Lead Channel Pacing Threshold Amplitude: 0.6 V
Lead Channel Pacing Threshold Amplitude: 0.6 V
Lead Channel Pacing Threshold Amplitude: 1 V
Lead Channel Pacing Threshold Amplitude: 1 V
Lead Channel Pacing Threshold Pulse Width: 0.4 ms
Lead Channel Pacing Threshold Pulse Width: 0.4 ms
Lead Channel Pacing Threshold Pulse Width: 0.4 ms
Lead Channel Pacing Threshold Pulse Width: 0.4 ms
Lead Channel Sensing Intrinsic Amplitude: 14.6 mV
Lead Channel Sensing Intrinsic Amplitude: 3 mV
Lead Channel Sensing Intrinsic Amplitude: 3 mV
Lead Channel Setting Pacing Amplitude: 2 V
Lead Channel Setting Pacing Amplitude: 2.4 V
Lead Channel Setting Pacing Pulse Width: 0.4 ms
Pulse Gen Serial Number: 66303845

## 2020-08-21 NOTE — Patient Instructions (Addendum)
Medication Instructions:  Your physician recommends that you continue on your current medications as directed. Please refer to the Current Medication list given to you today.  *If you need a refill on your cardiac medications before your next appointment, please call your pharmacy*   Lab Work: None ordered.  If you have labs (blood work) drawn today and your tests are completely normal, you will receive your results only by: Marland Kitchen MyChart Message (if you have MyChart) OR . A paper copy in the mail If you have any lab test that is abnormal or we need to change your treatment, we will call you to review the results.   Testing/Procedures: You had a RAM Memory dump from your Biotronik pacemaker today   Follow-Up: At [facility], you and your health needs are our priority.  As part of our continuing mission to provide you with exceptional heart care, we have created designated Provider Care Teams.  These Care Teams include your primary Cardiologist (physician) and Advanced Practice Providers (APPs -  Physician Assistants and Nurse Practitioners) who all work together to provide you with the care you need, when you need it.  We recommend signing up for the patient portal called "MyChart".  Sign up information is provided on this After Visit Summary.  MyChart is used to connect with patients for Virtual Visits (Telemedicine).  Patients are able to view lab/test results, encounter notes, upcoming appointments, etc.  Non-urgent messages can be sent to your provider as well.   To learn more about what you can do with MyChart, go to [email or web address].    Your next appointment:   As planned with Dr Caryl Comes

## 2020-08-21 NOTE — Progress Notes (Signed)
1 

## 2020-08-22 NOTE — Progress Notes (Signed)
Here for pacemaker interrogation

## 2020-08-31 ENCOUNTER — Other Ambulatory Visit: Payer: Self-pay

## 2020-08-31 ENCOUNTER — Encounter (HOSPITAL_BASED_OUTPATIENT_CLINIC_OR_DEPARTMENT_OTHER): Payer: Self-pay | Admitting: *Deleted

## 2020-08-31 ENCOUNTER — Emergency Department (HOSPITAL_BASED_OUTPATIENT_CLINIC_OR_DEPARTMENT_OTHER)
Admission: EM | Admit: 2020-08-31 | Discharge: 2020-08-31 | Disposition: A | Discharge status: Home / Self Care | Payer: 59 | Attending: Emergency Medicine | Admitting: Emergency Medicine

## 2020-08-31 DIAGNOSIS — R0602 Shortness of breath: Secondary | ICD-10-CM | POA: Diagnosis present

## 2020-08-31 DIAGNOSIS — T7840XA Allergy, unspecified, initial encounter: Secondary | ICD-10-CM | POA: Diagnosis not present

## 2020-08-31 DIAGNOSIS — Z5321 Procedure and treatment not carried out due to patient leaving prior to being seen by health care provider: Secondary | ICD-10-CM | POA: Diagnosis not present

## 2020-08-31 NOTE — Progress Notes (Signed)
RT out to triage to see pt for allergic reaction. Upon arrival to pt, pt talking in full complete sentences, no distress noted. Pt complains of lip swelling and tingling. No stridor heard at this time. SpO2 100% on room air. RT will continue to monitor and be available as needed.

## 2020-08-31 NOTE — ED Triage Notes (Addendum)
Pt reports SOB , " allergic reaction " , x 1 month  no distress noted, during triage pt states she doesn't want to wait ,  And she is gong to take her benadryl and atarax

## 2020-09-11 ENCOUNTER — Ambulatory Visit: Payer: 59 | Admitting: Internal Medicine

## 2020-09-11 DIAGNOSIS — Z0289 Encounter for other administrative examinations: Secondary | ICD-10-CM

## 2020-09-11 NOTE — Progress Notes (Deleted)
Name: Erin Casey  MRN/ DOB: [id number], May 24, 1981    Age/ Sex: 39 y.o., female     PCP: Erin Rasmussen, MD   Reason for Endocrinology Evaluation: Adrenal Adenoma      Initial Endocrinology Clinic Visit: 03/20/2020    PATIENT IDENTIFIER: Erin Casey is a 39 y.o., female with a past medical history of Neurocardiogenic Syncope and hx of VT (S/P pacemaker placement) , and Vitamin B12 deficiency. She has followed with [facility] since 03/20/2020 for consultative assistance with management of her Adrenal Adenoma .   HISTORICAL SUMMARY:  Pt follows with cardiology for a neurocardiogenic syncope and Hx of VT's . She has a pacemaker in place. Pt on methylphenidate for hypotension.   Pt was noted to have an incidental finding of 1.6 cm left adrenal adenoma during imaging for back pain sustained secondary to a fall.  Cortisol, aldo, renin and metanephrines were all normal (02/2020)    She was also c/o hirsutism , her testosterone was normal at 35 ng/dL and DHEAS normal at 143 mcg/dL .She is not a candidate for COC due to father with Hx PE and DVT as personal hx of superficial thrombosis .She is not a candidate for Spironolactone due to hypotension.   SUBJECTIVE:    Today (09/11/2020):  [name] is here for adrenal adenoma .  Substantial weight gain- yes . Gained 8 lbs in 1 week Centripetal obesity- Yes Easy bruisbility- no Severe hypertension- No DM- no Proximal muscle weakness -no Fatigue- yes  Sudden/ severe headaches- has occasional more frequent headache Weight loss- no  Anxiety attacks- no Sweating- yes  Cardiac arrhythmias- yes neurocardiogenic syncope, PVC  Palpitations- yes Fluid retention- yes Hypokalemia- once in 2019   HISTORY:  Past Medical History:  Past Medical History:  Diagnosis Date  . Anemia   . Depletion of volume of extracellular fluid   . Dysautonomia ([place]) 2003  . Essential hypertension 04/20/2013  . GERD  (gastroesophageal reflux disease)   . Neurocardiogenic syncope   . Neurocardiogenic syncope 2003  . Neurologic cardiac syncope   . 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 Vitamin B12 deficiency 07/28/2018    Past Surgical History:  Past Surgical History:  Procedure Laterality Date  . ABDOMINAL HYSTERECTOMY    . CESAREAN SECTION    . CHOLECYSTECTOMY    . hysterectomy    . LAPAROSCOPIC GASTRIC BANDING     With reversal   . PACEMAKER INSERTION    . PVC ABLATION N/A 03/12/2018   Procedure: PVC ABLATION;  Surgeon: Evans Lance, MD;  Location: [facility];  Service: Cardiovascular;  Laterality: N/A;  . TONSILLECTOMY AND ADENOIDECTOMY    . TUBAL LIGATION    . ULNAR TUNNEL RELEASE Right 07/28/2019   Procedure: Right carpal tunnel release, right cubital tunnel release;  Surgeon: Verner Mould, MD;  Location: [facility];  Service: Orthopedics;  Laterality: Right;     Social History:  reports that she has never smoked. She has never used smokeless tobacco. She reports current alcohol use. She reports that she does not use drugs. Family History:  Family History  Problem Relation Age of Onset  . Diabetes Mother   . Hyperlipidemia Mother   . Hypertension Father   . Heart disease Father   . Hyperlipidemia Father   . Mental illness Sister   . Cancer Maternal Grandmother   . Hyperlipidemia Maternal Grandmother   . Heart disease Maternal Grandfather   . Hyperlipidemia Maternal Grandfather   .  Hyperlipidemia Paternal Grandmother   . Hypertension Paternal Grandmother   . Heart disease Paternal Grandfather   . Hyperlipidemia Paternal Grandfather       HOME MEDICATIONS: Allergies as of 09/11/2020      Reactions   Cinnamon Anaphylaxis   Crab Extract Allergy Skin Test Anaphylaxis   Eggs Or Egg-derived Products Anaphylaxis   Flu Virus Vaccine Anaphylaxis   Levaquin [levofloxacin] Anaphylaxis   Pineapple Rash   Swelling, rash, GI upset   Polysorbate Anaphylaxis   Pork-derived  Products Anaphylaxis   Sulfa Antibiotics Hives, Nausea Only, Rash   Amoxicillin-pot Clavulanate Other (See Comments)   [Derm] [GI]   Sulfamethoxazole Nausea And Vomiting, Rash      Medication List       Accurate as of September 11, 2020  7:54 AM. If you have any questions, ask your nurse or doctor.        albuterol 108 (90 Base) MCG/ACT inhaler Commonly known as: VENTOLIN HFA Inhale into the lungs every 4 (four) hours as needed for wheezing.   budesonide-formoterol 160-4.5 MCG/ACT inhaler Commonly known as: SYMBICORT Inhale 2 puffs into the lungs 2 (two) times daily as needed (shortness of breath).   cetirizine 10 MG tablet Commonly known as: ZYRTEC Take 10 mg by mouth 2 (two) times daily.   cyanocobalamin 1000 MCG/ML injection Commonly known as: (VITAMIN B-12) Inject 1,000 mcg into the muscle every 14 (fourteen) days.   EPINEPHrine 0.3 mg/0.3 mL Soaj injection Commonly known as: EpiPen 2-Pak Inject 0.3 mLs (0.3 mg total) into the muscle once as needed for up to 1 dose (for severe allergic reaction). CAll 911 immediately if you have to use this medicine   famotidine 20 MG tablet Commonly known as: Pepcid Take 1 tablet (20 mg total) by mouth 2 (two) times daily.   MAGNESIUM MALATE PO Take 202.5 mg by mouth daily.   methylphenidate 54 MG CR tablet Commonly known as: CONCERTA Take 1 tablet (54 mg total) by mouth every morning.   ondansetron 4 MG tablet Commonly known as: ZOFRAN Take 1 tablet (4 mg total) by mouth every 8 (eight) hours as needed.   prenatal multivitamin Tabs tablet Take 1 tablet by mouth daily at 12 noon.   QUERCETIN PO Take 200 mg by mouth daily. 4 tabs daily (1000mg )   Vitamin D (Ergocalciferol) 1.25 MG (50000 UNIT) Caps capsule Commonly known as: DRISDOL Take 50,000 Units by mouth every 7 (seven) days.         OBJECTIVE:   PHYSICAL EXAM: VS: There were no vitals taken for this visit.   EXAM: General: Pt appears well and is in NAD   Hydration: Well-hydrated with moist mucous membranes and good skin turgor  Eyes: External eye exam normal without stare, lid lag or exophthalmos.  EOM intact.  PERRL.  Ears, Nose, Throat: Hearing: Grossly intact bilaterally Dental: Good dentition  Throat: Clear without mass, erythema or exudate  Neck: General: Supple without adenopathy. Thyroid: Thyroid size normal.  No goiter or nodules appreciated. No thyroid bruit.  Lungs: Clear with good BS bilat with no rales, rhonchi, or wheezes  Heart: Auscultation: RRR.  Abdomen: Normoactive bowel sounds, soft, nontender, without masses or organomegaly palpable  Extremities: Gait and station: Normal gait  Digits and nails: No clubbing, cyanosis, petechiae, or nodes Head and neck: Normal alignment and mobility BL UE: Normal ROM and strength. BL LE: No pretibial edema normal ROM and strength.  Skin: Hair: Texture and amount normal with gender appropriate distribution Skin Inspection: No  rashes, acanthosis nigricans/skin tags. No lipohypertrophy Skin Palpation: Skin temperature, texture, and thickness normal to palpation  Neuro: Cranial nerves: II - XII grossly intact  Cerebellar: Normal coordination and movement; no tremor Motor: Normal strength throughout DTRs: 2+ and symmetric in UE without delay in relaxation phase  Mental Status: Judgment, insight: Intact Orientation: Oriented to time, place, and person Memory: Intact for recent and remote events Mood and affect: No depression, anxiety, or agitation     DATA REVIEWED: ***    ASSESSMENT / PLAN / RECOMMENDATIONS:   1. Left adrenal adenoma :   Eighty five percent of adrenal adenomas are nonsecretory.  - Three forms of adrenal hyperfunction should be considered in patients with adrenal incidentaloma   Glucocorticoid hypersecretion  Primary hyperaldosteronism  Pheochromocytoma  - Will proceed with testing of the above    Signed electronically by: Mack Guise,  MD  [facility] End[facility]  [facility] [address] [place], [place] [address] Phone: [phone] FAX: [phone]      CC: Erin Rasmussen, MD [place] [address] Phone: [phone]  Fax: [phone]   Return to Endocrinology clinic as below: Future Appointments  Date Time Provider [place]  09/11/2020  8:30 AM Sherylann Vangorden, Shakeita Crazier, MD [facility] [place]  11/15/2020  7:25 AM [facility] DEVICE REMOTES [facility]  02/14/2021  7:25 AM [facility] DEVICE REMOTES [facility]  05/16/2021  7:25 AM [facility] DEVICE REMOTES [facility]  08/15/2021  7:25 AM [facility] DEVICE REMOTES [facility]  11/14/2021  7:25 AM [facility] DEVICE REMOTES [facility]  02/13/2022  7:25 AM [facility] DEVICE REMOTES [facility]  05/15/2022  7:25 AM [facility] DEVICE REMOTES [facility]

## 2020-11-09 ENCOUNTER — Other Ambulatory Visit: Payer: Self-pay

## 2020-11-09 ENCOUNTER — Encounter: Payer: Self-pay | Admitting: Physician Assistant

## 2020-11-09 ENCOUNTER — Ambulatory Visit
Admission: RE | Admit: 2020-11-09 | Discharge: 2020-11-09 | Disposition: A | Discharge status: Home / Self Care | Payer: 59 | Source: Ambulatory Visit | Attending: Physician Assistant | Admitting: Physician Assistant

## 2020-11-09 ENCOUNTER — Ambulatory Visit: Payer: 59 | Admitting: Physician Assistant

## 2020-11-09 VITALS — BP 110/80 | HR 90 | Ht 70.5 in | Wt 279.0 lb

## 2020-11-09 DIAGNOSIS — R0602 Shortness of breath: Secondary | ICD-10-CM

## 2020-11-09 DIAGNOSIS — I472 Ventricular tachycardia: Secondary | ICD-10-CM

## 2020-11-09 DIAGNOSIS — R531 Weakness: Secondary | ICD-10-CM | POA: Diagnosis not present

## 2020-11-09 DIAGNOSIS — R079 Chest pain, unspecified: Secondary | ICD-10-CM

## 2020-11-09 DIAGNOSIS — Z950 Presence of cardiac pacemaker: Secondary | ICD-10-CM

## 2020-11-09 DIAGNOSIS — I4729 Other ventricular tachycardia: Secondary | ICD-10-CM

## 2020-11-09 LAB — CUP PACEART INCLINIC DEVICE CHECK
Date Time Interrogation Session: 20220211132815
Implantable Lead Implant Date: 20130708
Implantable Lead Implant Date: 20130708
Implantable Lead Location: 753859
Implantable Lead Location: 753860
Implantable Lead Model: 350
Implantable Lead Model: 350
Implantable Lead Serial Number: 29222139
Implantable Lead Serial Number: 29231638
Implantable Pulse Generator Implant Date: 20130708
Lead Channel Impedance Value: 546 Ohm
Lead Channel Impedance Value: 546 Ohm
Lead Channel Pacing Threshold Amplitude: 0.6 V
Lead Channel Pacing Threshold Amplitude: 0.6 V
Lead Channel Pacing Threshold Amplitude: 0.9 V
Lead Channel Pacing Threshold Amplitude: 0.9 V
Lead Channel Pacing Threshold Pulse Width: 0.4 ms
Lead Channel Pacing Threshold Pulse Width: 0.4 ms
Lead Channel Pacing Threshold Pulse Width: 0.4 ms
Lead Channel Pacing Threshold Pulse Width: 0.4 ms
Lead Channel Sensing Intrinsic Amplitude: 16.8 mV
Lead Channel Sensing Intrinsic Amplitude: 2.7 mV
Lead Channel Sensing Intrinsic Amplitude: 2.7 mV
Lead Channel Setting Pacing Amplitude: 2 V
Lead Channel Setting Pacing Amplitude: 2.4 V
Lead Channel Setting Pacing Pulse Width: 0.4 ms
Pulse Gen Serial Number: 66303845

## 2020-11-09 LAB — TROPONIN T: Troponin T (Highly Sensitive): <6 ng/L (ref 0–14)

## 2020-11-09 NOTE — Progress Notes (Signed)
Cardiology Office Note Date:  11/09/2020  Patient ID:  Hollis, Oh 1980/12/21, MRN [id number] PCP:  [name], MD  Cardiologist/Electrophysiologist: Dr. Caryl Comes    Chief Complaint:  CP, palpitations  History of Present Illness: Erin Casey is a 40 y.o. female with history of symptomatic neurocardiogenic syncope w/CLS pacemaker in place, PVCs w/hx of flecainide that she did not tolerate and h/o EP study without inducible arrhythmias (2019), h/x of provoked LUE DVT (after IV) treated with short course of xarelto, HTN, SVT.  She comes today to be seen for Dr. Caryl Comes last seen by him 07/24/20 doing very well with no symptoms, her appt then 2/2 to 13 HVR episodes on a device remote. He noted "Recurrent high ventricular rate events.  Overall heart rate histogram is unchanged over the last 3-4 years.  Given the shift to the right, we will check an echocardiogram to make sure that there is no consequence to the left ventricular function.  It may be partly related to her stimulants.  It may also be her norm." Echo noted preserved LVEF  She was added to my schedule calling with "laung haul COVID" from illness in December, and new onset CP, palpitations.  She reports having tested + for COVID 25 days ago.  Intially she felt quite ill, cough/congestion, reported for a couple days in the beginning her O2 sats dipped as ow as 77%. She was treated with MAB and did start to feel better from her acute symptoms, though was left feeling pretty weak, tired. Has been told she has covid long haul. She reports lightheaded weak spells, different then her known historical symptoms, and she has been monitoring her BP and orthostatic vitas at home and they have been ok. She felt like last week she was starting to improve finally, last weekend feeling more towards herself, though Monday again feeling weak, tired, and get ging winded very easily.  She says minimal exertion will get her winded again and is  associated with a heaviness in her chest.  She has the same SOB and heaviness when laying down. Seated she has no SOB or heaviness. She has had some palpitations intermittently as well  She went to her PMD office yesterday and was given a lite of IVF.  She says that when she has gotten weask and lightheaded in the past this is what they have done for her.  She did see her provider and was told that it could takes months for her to feel like she is getting better. She called today worried about her heart.  She is on prenatal vitamins for low folic acid and denies pregnancy    Device information Biotronik dual chamber PPM implanted 04/05/2012   Past Medical History:  Diagnosis Date  . Anemia   . Depletion of volume of extracellular fluid   . Dysautonomia ([place]) 2003  . Essential hypertension 04/20/2013  . GERD (gastroesophageal reflux disease)   . Neurocardiogenic syncope   . Neurocardiogenic syncope 2003  . Neurologic cardiac syncope   . 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 Vitamin B12 deficiency 07/28/2018    Past Surgical History:  Procedure Laterality Date  . ABDOMINAL HYSTERECTOMY    . CESAREAN SECTION    . CHOLECYSTECTOMY    . hysterectomy    . LAPAROSCOPIC GASTRIC BANDING     With reversal   . PACEMAKER INSERTION    . PVC ABLATION N/A 03/12/2018   Procedure: PVC ABLATION;  Surgeon: Evans Lance, MD;  Location:  [facility];  Service: Cardiovascular;  Laterality: N/A;  . TONSILLECTOMY AND ADENOIDECTOMY    . TUBAL LIGATION    . ULNAR TUNNEL RELEASE Right 07/28/2019   Procedure: Right carpal tunnel release, right cubital tunnel release;  Surgeon: Verner Mould, MD;  Location: [facility];  Service: Orthopedics;  Laterality: Right;    Current Outpatient Medications  Medication Sig Dispense Refill  . albuterol (PROVENTIL HFA;VENTOLIN HFA) 108 (90 Base) MCG/ACT inhaler Inhale into the lungs every 4 (four) hours as needed for wheezing.     . budesonide-formoterol  (SYMBICORT) 160-4.5 MCG/ACT inhaler Inhale 2 puffs into the lungs 2 (two) times daily as needed (shortness of breath).    . cetirizine (ZYRTEC) 10 MG tablet Take 10 mg by mouth 2 (two) times daily.    . cyanocobalamin (,VITAMIN B-12,) 1000 MCG/ML injection Inject 1,000 mcg into the muscle every 14 (fourteen) days.     Marland Kitchen EPINEPHrine (EPIPEN 2-PAK) 0.3 mg/0.3 mL IJ SOAJ injection Inject 0.3 mLs (0.3 mg total) into the muscle once as needed for up to 1 dose (for severe allergic reaction). CAll 911 immediately if you have to use this medicine 1 each 1  . famotidine (PEPCID) 20 MG tablet Take 1 tablet (20 mg total) by mouth 2 (two) times daily. 10 tablet 0  . MAGNESIUM MALATE PO Take 202.5 mg by mouth daily.    . methylphenidate 54 MG PO CR tablet Take 1 tablet (54 mg total) by mouth every morning. 90 tablet 0  . ondansetron (ZOFRAN) 4 MG tablet Take 1 tablet (4 mg total) by mouth every 8 (eight) hours as needed. 30 tablet 0  . Prenatal Vit-Fe Fumarate-FA (PRENATAL MULTIVITAMIN) TABS tablet Take 1 tablet by mouth daily at 12 noon.    Marland Kitchen QUERCETIN PO Take 200 mg by mouth daily. 4 tabs daily (1000mg )    . Vitamin D, Ergocalciferol, (DRISDOL) 1.25 MG (50000 UT) CAPS capsule Take 50,000 Units by mouth every 7 (seven) days.     No current facility-administered medications for this visit.    Allergies:   Cinnamon, Crab extract allergy skin test, Eggs or egg-derived products, Influenza virus vaccine, Levaquin [levofloxacin], Pineapple, Polysorbate, Pork-derived products, Sulfa antibiotics, Amoxicillin-pot clavulanate, and Sulfamethoxazole   Social History:  The patient  reports that she has never smoked. She has never used smokeless tobacco. She reports current alcohol use. She reports that she does not use drugs.   Family History:  The patient's family history includes Cancer in her maternal grandmother; Diabetes in her mother; Heart disease in her father, maternal grandfather, and paternal grandfather;  Hyperlipidemia in her father, maternal grandfather, maternal grandmother, mother, paternal grandfather, and paternal grandmother; Hypertension in her father and paternal grandmother; Mental illness in her sister.  ROS:  Please see the history of present illness.    All other systems are reviewed and otherwise negative.   PHYSICAL EXAM:  VS:  There were no vitals taken for this visit. BMI: There is no height or weight on file to calculate BMI. Well nourished, well developed, in no acute distress HEENT: normocephalic, atraumatic Neck: no JVD, carotid bruits or masses Cardiac:  RRR; no significant murmurs, I do not hear any rubs, or gallops, heart sounds are not distant Lungs:  CTA b/l, no wheezing, rhonchi or rales Abd: soft, nontender MS: no deformity or atrophy Ext: no edema Skin: warm and dry, no rash Neuro:  No gross deficits appreciated Psych: euthymic mood, full affect  PPM site is stable, no  tethering or discomfort   EKG:  Done today and reviewed by myself shows  Reviewed with DOD, AP/SR 90bpm, T inv III, noted on prior EKGs, more pronounced today, no ST changes   Device interrogation done today by industry and reviewed by myself:  Battery and lead measurements are good 55%AP, 0% VP HVR/AT events noted Longest was 65min All in d/w rep were the same 1:1 AT   08/15/20: TTE IMPRESSIONS  1. Left ventricular ejection fraction, by estimation, is 60 to 65%. The  left ventricle has normal function. The left ventricle has no regional  wall motion abnormalities. Left ventricular diastolic parameters were  normal.  2. Right ventricular systolic function is normal. The right ventricular  size is normal. There is normal pulmonary artery systolic pressure. The  estimated right ventricular systolic pressure is 62.8 mmHg.  3. The mitral valve is grossly normal. Trivial mitral valve  regurgitation.  4. The aortic valve is tricuspid. Aortic valve regurgitation is not  visualized.   5. The inferior vena cava is normal in size with greater than 50%  respiratory variability, suggesting right atrial pressure of 3 mmHg.   Comparison(s): 01/18/16 EF 55-60%.     Recent Labs: 01/23/2020: BUN 13; Creatinine, Ser 0.72; Hemoglobin 15.1; Platelets 205; Sodium 138 03/20/2020: Potassium 3.9  No results found for requested labs within last 8760 hours.   CrCl cannot be calculated (Patient's most recent lab result is older than the maximum 21 days allowed.).   Wt Readings from Last 3 Encounters:  08/31/20 270 lb (122.5 kg)  08/21/20 275 lb 3.2 oz (124.8 kg)  07/24/20 270 lb (122.5 kg)     Other studies reviewed: Additional studies/records reviewed today include: summarized above  ASSESSMENT AND PLAN:  1. PPM     Intact function, no programming changes made  2. SVT    HVR/AT episodes are a 1:1 AT/SVT,     rates 113-'s-140's, and are very brief, longest a minute since her last interrogation    These are not new for her, burden does not appear changed/increased in review of her last interrogation in November  3. SOB/CP     She describes exertional and orthopneic symptoms     Started prior to her IVF yesterday that were given to try and help her fatigue, weakness, lightheadedness.     Her exam does not suggest volume OL  Seems much of her symptoms, the fatigue, weakness, lightheadedness has been part of her symptoms picture with her COVID Was getting better for a coupl days though seemed to back slide again  DOE/chest heaviness ongoing for the last 3-4 days, no escalation VSS, she does not appear in distress, SOB or ill here Orthostatic vitals are fairly unremarkable with only a slight increae of HR 10-13bpm with position change EKG without acute/ischemic changes  I reviewed her EKG and care with DOD (Dr. Johney Frame) today Plan for echo ASAP Will get CXR, labs including BNP and Trop (stat).  Recommended if Trop normal would pursue Ca scoring in young female, if  abnormal refer to the hospital  I spoke with the patient, should she not start to feel better, or if she has any escalation in her symptoms she should seek medical attention at the nearest ER, or EMS if she feels needed.  Disposition: F/u with Dr. Berenice Bouton in 2-3 weeks, sooner if needed  Current medicines are reviewed at length with the patient today.  The patient did not have any concerns regarding medicines.    Signed,  Tommye Standard, PA-C 11/09/2020 8:55 AM     [address] [place] [address] [phone] (office)  [phone] (fax)

## 2020-11-09 NOTE — Patient Instructions (Addendum)
Medication Instructions:   Your physician recommends that you continue on your current medications as directed. Please refer to the Current Medication list given to you today.  *If you need a refill on your cardiac medications before your next appointment, please call your pharmacy*   Lab Work:  BMET CBC BNP AND TROPONIN T  TODAY   If you have labs (blood work) drawn today and your tests are completely normal, you will receive your results only by: Marland Kitchen MyChart Message (if you have MyChart) OR . A paper copy in the mail If you have any lab test that is abnormal or we need to change your treatment, we will call you to review the results.   Testing/Procedures:  Your physician has requested that you have an echocardiogram. Echocardiography is a painless test that uses sound waves to create images of your heart. It provides your doctor with information about the size and shape of your heart and how well your heart's chambers and valves are working. This procedure takes approximately one hour. There are no restrictions for this procedure.  A chest x-ray takes a picture of the organs and structures inside the chest, including the heart, lungs, and blood vessels. This test can show several things, including, whether the heart is enlarges; whether fluid is building up in the lungs; and whether pacemaker / defibrillator leads are still in place. [facility] Address: [address], [place], [place] [address] Hours:  Wednesday 8AM-5PM Thursday 8AM-5PM Friday 8AM-5PM Saturday Closed Sunday Closed Monday 8AM-5PM Tuesday 8AM-5PM Phone: [phone]   Follow-Up: At [facility], you and your health needs are our priority.  As part of our continuing mission to provide you with exceptional heart care, we have created designated Provider Care Teams.  These Care Teams include your primary Cardiologist (physician) and Advanced Practice Providers (APPs -  Physician Assistants and  Nurse Practitioners) who all work together to provide you with the care you need, when you need it.  We recommend signing up for the patient portal called "MyChart".  Sign up information is provided on this After Visit Summary.  MyChart is used to connect with patients for Virtual Visits (Telemedicine).  Patients are able to view lab/test results, encounter notes, upcoming appointments, etc.  Non-urgent messages can be sent to your provider as well.   To learn more about what you can do with MyChart, go to [email or web address].    Your next appointment:   2-3  week(s) CONTACT [facility] FOR HELP   The format for your next appointment:   In Person  Provider:   You may see Dr. Caryl Comes  or one of the following Advanced Practice Providers on your designated Care Team:    Tommye Standard, [place]  Legrand Como "Jonni Sanger" Chalmers Cater, [place]    Other Instructions

## 2020-11-10 LAB — CBC
Hematocrit: 46.7 % — ABNORMAL HIGH (ref 34.0–46.6)
Hemoglobin: 16.1 g/dL — ABNORMAL HIGH (ref 11.1–15.9)
MCH: 31.4 pg (ref 26.6–33.0)
MCHC: 34.5 g/dL (ref 31.5–35.7)
MCV: 91 fL (ref 79–97)
Platelets: 219 10*3/uL (ref 150–450)
RBC: 5.13 x10E6/uL (ref 3.77–5.28)
RDW: 12.4 % (ref 11.7–15.4)
WBC: 6 10*3/uL (ref 3.4–10.8)

## 2020-11-10 LAB — BASIC METABOLIC PANEL
BUN/Creatinine Ratio: 16 (ref 9–23)
BUN: 12 mg/dL (ref 6–24)
CO2: 23 mmol/L (ref 20–29)
Calcium: 9.1 mg/dL (ref 8.7–10.2)
Chloride: 103 mmol/L (ref 96–106)
Creatinine, Ser: 0.73 mg/dL (ref 0.57–1.00)
GFR calc Af Amer: 119 mL/min/{1.73_m2} (ref >59)
GFR calc non Af Amer: 103 mL/min/{1.73_m2} (ref >59)
Glucose: 110 mg/dL — ABNORMAL HIGH (ref 65–99)
Potassium: 4.5 mmol/L (ref 3.5–5.2)
Sodium: 138 mmol/L (ref 134–144)

## 2020-11-10 LAB — PRO B NATRIURETIC PEPTIDE: NT-Pro BNP: 72 pg/mL (ref 0–130)

## 2020-11-14 LAB — CUP PACEART REMOTE DEVICE CHECK
Battery Remaining Percentage: 45 %
Brady Statistic RA Percent Paced: 53 %
Brady Statistic RV Percent Paced: 0 %
Date Time Interrogation Session: 20220216161137
Implantable Lead Implant Date: 20130708
Implantable Lead Implant Date: 20130708
Implantable Lead Location: 753859
Implantable Lead Location: 753860
Implantable Lead Model: 350
Implantable Lead Model: 350
Implantable Lead Serial Number: 29222139
Implantable Lead Serial Number: 29231638
Implantable Pulse Generator Implant Date: 20130708
Lead Channel Impedance Value: 488 Ohm
Lead Channel Impedance Value: 507 Ohm
Lead Channel Pacing Threshold Amplitude: 1 V
Lead Channel Pacing Threshold Pulse Width: 0.4 ms
Lead Channel Sensing Intrinsic Amplitude: 1.5 mV
Lead Channel Sensing Intrinsic Amplitude: 9.6 mV
Lead Channel Setting Pacing Amplitude: 2 V
Lead Channel Setting Pacing Amplitude: 2.4 V
Lead Channel Setting Pacing Pulse Width: 0.4 ms
Pulse Gen Serial Number: 66303845

## 2020-11-15 ENCOUNTER — Ambulatory Visit (INDEPENDENT_AMBULATORY_CARE_PROVIDER_SITE_OTHER): Payer: 59

## 2020-11-15 DIAGNOSIS — I472 Ventricular tachycardia: Secondary | ICD-10-CM

## 2020-11-15 DIAGNOSIS — I4729 Other ventricular tachycardia: Secondary | ICD-10-CM

## 2020-11-21 NOTE — Progress Notes (Signed)
Remote pacemaker transmission.   

## 2020-11-22 ENCOUNTER — Other Ambulatory Visit: Payer: Self-pay

## 2020-11-22 ENCOUNTER — Ambulatory Visit (HOSPITAL_COMMUNITY): Payer: 59 | Attending: Cardiology

## 2020-11-22 DIAGNOSIS — R0602 Shortness of breath: Secondary | ICD-10-CM | POA: Insufficient documentation

## 2020-11-22 DIAGNOSIS — R079 Chest pain, unspecified: Secondary | ICD-10-CM | POA: Diagnosis present

## 2020-11-22 LAB — ECHOCARDIOGRAM COMPLETE
Area-P 1/2: 3.91 cm2
S' Lateral: 2.9 cm

## 2020-11-22 MED ORDER — PERFLUTREN LIPID MICROSPHERE
1.0000 mL | INTRAVENOUS | Status: AC | PRN
  Administered 2020-11-22: 2 mL via INTRAVENOUS

## 2020-11-26 NOTE — Progress Notes (Deleted)
Electrophysiology Office Note Date: 11/27/2020  ID:  Erin Casey, DOB Mar 22, 1981, MRN [id number]  PCP: [name], MD Primary Cardiologist: No primary care provider on file. Electrophysiologist: Virl Axe, MD   CC: Routine ICD follow-up  Erin Casey is a 40 y.o. female seen today for Virl Axe, MD for routine electrophysiology followup.  Since last being seen in our clinic the patient reports doing ***.   Seen in office by [facility] 11/09/20.  "She reports having tested + for COVID 25 days ago.  Intially she felt quite ill, cough/congestion, reported for a couple days in the beginning her O2 sats dipped as ow as 77%. She was treated with MAB and did start to feel better from her acute symptoms, though was left feeling pretty weak, tired. Has been told she has covid long haul. She reports lightheaded weak spells, different then her known historical symptoms, and she has been monitoring her BP and orthostatic vitas at home and they have been ok. She felt like last week she was starting to improve finally, last weekend feeling more towards herself, though Monday again feeling weak, tired, and get ging winded very easily.  She says minimal exertion will get her winded again and is associated with a heaviness in her chest.  She has the same SOB and heaviness when laying down. Seated she has no SOB or heaviness. She has had some palpitations intermittently as well"  Saw PCP and got IVF. Echo, Labs, and CXR were both re-assuring after last visit.    she denies chest pain, palpitations, dyspnea, PND, orthopnea, nausea, vomiting, dizziness, syncope, edema, weight gain, or early satiety. {He/she (caps):30048} has not had ICD shocks.   Device History: Biotronik Dual Chamber ICD implanted 04/05/2012  Past Medical History:  Diagnosis Date  . Anemia   . Depletion of volume of extracellular fluid   . Dysautonomia ([place]) 2003  . Essential hypertension 04/20/2013  . GERD (gastroesophageal  reflux disease)   . Neurocardiogenic syncope   . Neurocardiogenic syncope 2003  . Neurologic cardiac syncope   . 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 Vitamin B12 deficiency 07/28/2018   Past Surgical History:  Procedure Laterality Date  . ABDOMINAL HYSTERECTOMY    . CESAREAN SECTION    . CHOLECYSTECTOMY    . hysterectomy    . LAPAROSCOPIC GASTRIC BANDING     With reversal   . PACEMAKER INSERTION    . PVC ABLATION N/A 03/12/2018   Procedure: PVC ABLATION;  Surgeon: Evans Lance, MD;  Location: [place] [facility];  Service: Cardiovascular;  Laterality: N/A;  . TONSILLECTOMY AND ADENOIDECTOMY    . TUBAL LIGATION    . ULNAR TUNNEL RELEASE Right 07/28/2019   Procedure: Right carpal tunnel release, right cubital tunnel release;  Surgeon: Verner Mould, MD;  Location: [facility];  Service: Orthopedics;  Laterality: Right;    Current Outpatient Medications  Medication Sig Dispense Refill  . albuterol (PROVENTIL HFA;VENTOLIN HFA) 108 (90 Base) MCG/ACT inhaler Inhale into the lungs every 4 (four) hours as needed for wheezing.     . Blood Glucose Monitoring Suppl (GLUCOCOM BLOOD GLUCOSE MONITOR) DEVI 1 each by XX route as directed One touch verio reflect meter    . Blood Glucose Monitoring Suppl (ONE TOUCH ULTRA 2) w/Device KIT     . Blood Glucose Monitoring Suppl (ONETOUCH VERIO REFLECT) w/Device KIT USE AS DIRECTED THREE TIMES A DAY FOR CBG MONITORING    . budesonide-formoterol (SYMBICORT) 160-4.5 MCG/ACT inhaler Inhale 2  puffs into the lungs 2 (two) times daily as needed (shortness of breath).    . cetirizine (ZYRTEC) 10 MG tablet Take 10 mg by mouth 2 (two) times daily.    . cyanocobalamin (,VITAMIN B-12,) 1000 MCG/ML injection Inject 1,000 mcg into the muscle every 14 (fourteen) days.     Marland Kitchen EPINEPHrine (EPIPEN 2-PAK) 0.3 mg/0.3 mL IJ SOAJ injection Inject 0.3 mLs (0.3 mg total) into the muscle once as needed for up to 1 dose (for severe allergic reaction). CAll 911 immediately if you  have to use this medicine 1 each 1  . famotidine (PEPCID) 20 MG tablet Take 1 tablet (20 mg total) by mouth 2 (two) times daily. 10 tablet 0  . MAGNESIUM MALATE PO Take 202.5 mg by mouth daily.    . methylphenidate 54 MG PO CR tablet Take 1 tablet (54 mg total) by mouth every morning. 90 tablet 0  . ondansetron (ZOFRAN) 4 MG tablet Take 1 tablet (4 mg total) by mouth every 8 (eight) hours as needed. 30 tablet 0  . ONETOUCH VERIO test strip 1 each 3 (three) times daily.    Marland Kitchen OZEMPIC, 0.25 OR 0.5 MG/DOSE, 2 MG/1.5ML SOPN SMARTSIG:0.25 Milligram(s) SUB-Q Once a Week    . polyethylene glycol powder (GLYCOLAX/MIRALAX) 17 GM/SCOOP powder Take by mouth.    . Prenatal 27-1 MG TABS     . Prenatal Vit-Fe Fumarate-FA (PRENATAL MULTIVITAMIN) TABS tablet Take 1 tablet by mouth daily at 12 noon.    Marland Kitchen QUERCETIN PO Take 200 mg by mouth daily. 4 tabs daily (1034m)    . Vitamin D, Ergocalciferol, (DRISDOL) 1.25 MG (50000 UT) CAPS capsule Take 50,000 Units by mouth every 7 (seven) days.     No current facility-administered medications for this visit.    Allergies:   Cinnamon, Crab extract allergy skin test, Eggs or egg-derived products, Influenza virus vaccine, Levaquin [levofloxacin], Pineapple, Polysorbate, Pork-derived products, Sulfa antibiotics, Amoxicillin-pot clavulanate, and Sulfamethoxazole   Social History: Social History   Socioeconomic History  . Marital status: Divorced    Spouse name: Not on file  . Number of children: Not on file  . Years of education: Not on file  . Highest education level: Not on file  Occupational History  . Not on file  Tobacco Use  . Smoking status: Never Smoker  . Smokeless tobacco: Never Used  Vaping Use  . Vaping Use: Never used  Substance and Sexual Activity  . Alcohol use: Yes    Comment: occassionally  . Drug use: Never  . Sexual activity: Not Currently    Birth control/protection: Surgical  Other Topics Concern  . Not on file  Social History  Narrative  . Not on file   Social Determinants of Health   Financial Resource Strain: Not on file  Food Insecurity: Not on file  Transportation Needs: Not on file  Physical Activity: Not on file  Stress: Not on file  Social Connections: Not on file  Intimate Partner Violence: Not on file    Family History: Family History  Problem Relation Age of Onset  . Diabetes Mother   . Hyperlipidemia Mother   . Hypertension Father   . Heart disease Father   . Hyperlipidemia Father   . Mental illness Sister   . Cancer Maternal Grandmother   . Hyperlipidemia Maternal Grandmother   . Heart disease Maternal Grandfather   . Hyperlipidemia Maternal Grandfather   . Hyperlipidemia Paternal Grandmother   . Hypertension Paternal Grandmother   . Heart disease  Paternal Grandfather   . Hyperlipidemia Paternal Grandfather     Review of Systems: All other systems reviewed and are otherwise negative except as noted above.   Physical Exam: There were no vitals filed for this visit.   GEN- The patient is well appearing, alert and oriented x 3 today.   HEENT: normocephalic, atraumatic; sclera clear, conjunctiva pink; hearing intact; oropharynx clear; neck supple, no JVP Lymph- no cervical lymphadenopathy Lungs- Clear to ausculation bilaterally, normal work of breathing.  No wheezes, rales, rhonchi Heart- Regular rate and rhythm, no murmurs, rubs or gallops, PMI not laterally displaced GI- soft, non-tender, non-distended, bowel sounds present, no hepatosplenomegaly Extremities- no clubbing or cyanosis. No edema; DP/PT/radial pulses 2+ bilaterally MS- no significant deformity or atrophy Skin- warm and dry, no rash or lesion; ICD pocket well healed Psych- euthymic mood, full affect Neuro- strength and sensation are intact  ICD interrogation- reviewed in detail from last visit. Not tested today.   EKG:  EKG is not ordered today.  Recent Labs: 11/09/2020: BUN 12; Creatinine, Ser 0.73; Hemoglobin  16.1; NT-Pro BNP 72; Platelets 219; Potassium 4.5; Sodium 138   Wt Readings from Last 3 Encounters:  11/09/20 279 lb (126.6 kg)  08/31/20 270 lb (122.5 kg)  08/21/20 275 lb 3.2 oz (124.8 kg)     Other studies Reviewed: Additional studies/ records that were reviewed today include: Previous EP notes and recent labs/studies   Assessment and Plan:  1.  PPM Not tested today with recent visit and full check.   2. 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 No change based on check from last visit.  Would not be unlikely for this to be aggravated peri COVID. Follow.   3. SOB/CP Post COVID syndrome. Work up has been unremarkable.  Echo 11/22/20 LVEF 55-60% CXR 11/09/20 with no active disease Troponin T at least visit WNL. Continue to follow.   Current medicines are reviewed at length with the patient today.   The patient does not have concerns regarding her medicines.  The following changes were made today:  {NONE DEFAULTED:18576::"none"}  Labs/ tests ordered today include: *** No orders of the defined types were placed in this encounter.    Disposition:   Follow up with {Blank single:19197::"Dr. Allred","Dr. Arlan Organ. Klein","Dr. Camnitz","Dr. Lambert","EP APP"}  {gen number [id number]} {TIME; UNITS DAY/WEEK/MONTH:19136}   Signed, Shirley Friar, PA-C  11/27/2020 9:14 AM  [facility] [address] [place] [address] [phone] (office) [phone] (fax)

## 2020-11-27 ENCOUNTER — Encounter: Payer: 59 | Admitting: Student

## 2020-12-21 ENCOUNTER — Telehealth: Payer: Self-pay | Admitting: Emergency Medicine

## 2020-12-21 NOTE — Telephone Encounter (Addendum)
Returned patient's call. Patient concerned as to should she be seen in [place] for recent episodes? Patient states on 08/21/2020 she was seen by industry in clinic for some issues with her device. Patient stated that "her device was inappropriately recording V-tach" and that she was not in V-tach. Contacted Adam with [employer]. Had Quita Skye look at recent transmission and function of the device. Quita Skye is familiar with patient and checked patient's device in clinic in 10/2020. Device appears to be functioning normally. Patient advised that symptoms while exercising were sent to Dr. Caryl Comes for review/recommendations along with the alert notification.

## 2020-12-21 NOTE — Telephone Encounter (Addendum)
Contacted patient for a [employer] Alert received for high ventricular rates above daily limit of 5. No EGM available. Patient states that she is non-symptomatic today. Patient states that she has been exercising this week. On 12/18/20 patient states that she worked out for an hour at [employer]. During this workout patient states she became fatigued, short of breath, and off balance. Patient advised that she has never had these symptoms during a workout session. Over the week patient states that she also went to the gym on Thursday 12/20/20 but did not become as symptomatic as she did on Tuesday. Patient given the [facility] number [phone] if any concerns or problems. Routing to Dr. Caryl Comes for review/recommendations.

## 2020-12-26 NOTE — Telephone Encounter (Signed)
J  good morning We will keep an eye on this and the symptoms on 3/22 do not appear to be related to HR but may be BP so maybe she can take her BP at the gym if recurrent symptoms

## 2020-12-27 ENCOUNTER — Telehealth: Payer: Self-pay | Admitting: Emergency Medicine

## 2020-12-27 NOTE — Telephone Encounter (Signed)
Contacted patient and advised her of Dr. Olin Pia recommendations to keep a check on her blood pressure when exercising at the gym. Patient agreeable to plan. Advised patient that we would continue to monitor for alerts and if she had any device issues or more symptoms, that she could contact the [facility] at [phone].

## 2021-01-07 NOTE — Telephone Encounter (Signed)
Methylphenidate CR 54mg  - 1 tablet by mouth daily Rx for April, May and June 2022 written and placed at front for pt pick up.

## 2021-01-17 ENCOUNTER — Other Ambulatory Visit: Payer: Self-pay | Admitting: Student

## 2021-01-17 NOTE — Progress Notes (Signed)
Error

## 2021-01-17 NOTE — Telephone Encounter (Signed)
She has previously been encouraged to exercise and Dr. Caryl Comes recommended her symptoms and vitals be followed with exercise by his most recent phone response 12/26/2020. Cardiac Rehab would be a perfect fit for this.   I will gladly see her if she would like, but she doesn't require an in office visit for clearance to pursue cardiac rehab.  I think the guided and monitored exercise would be perfect for her current situation. Please send referral to cardiac rehab.   I think some of her increased symptoms are due to her recent bout with COVID. Though it was nearly 3 months ago, it is not abnormal for it to take even several months longer for folks stamina to get back to their baseline.   Erin Casey [address]" [place], PA-C  01/17/2021 12:45 PM

## 2021-01-21 ENCOUNTER — Encounter: Payer: 59 | Admitting: Student

## 2021-01-29 ENCOUNTER — Telehealth: Payer: Self-pay

## 2021-01-29 NOTE — Telephone Encounter (Signed)
NOTES ON FILE FROM [facility] [phone], SENT REFERRAL TO SCHEDULING 

## 2021-01-30 ENCOUNTER — Telehealth: Payer: Self-pay

## 2021-01-30 NOTE — Telephone Encounter (Signed)
[facility] requesting call back to make referral to PREP [phone] Returned call PA would like pt referred to PREP to start exercising.  PLAN: Will f/u with pt to inquire about scheduling

## 2021-01-30 NOTE — Telephone Encounter (Signed)
Per Oda Kilts, PA-C pt has been referred to PREP (Providers Referral Exercise Program) with [facility].  Pt is aware of referral.

## 2021-02-01 ENCOUNTER — Telehealth: Payer: Self-pay

## 2021-02-01 NOTE — Telephone Encounter (Signed)
Call to pt reference referral to [facility] program to pt. Confirmed interest and preferred class time.  Has been working with a Physiological scientist at work but comes close to passing out and needs to help determining her HR etc during exercise Next evening class 02/19/21  Intake scheduled for 02/12/21 at 530pm

## 2021-02-11 ENCOUNTER — Telehealth: Payer: Self-pay

## 2021-02-11 NOTE — Telephone Encounter (Signed)
[employer] alert received for 13 HVR within the last 24 hours. Longest in duration 52 seconds. Patient has hx of AT episdoes per [facility] report 11/09/20. No EGM's available.  Called patient and reports she has overall been feeling well. States last night she was driving home and felt dizzy but then resolved. No near syncope or syncope noted. Reports she has not been working out d/t waiting for consultation at the [facility] that was advised by A. Tillery, PA-C. States she is compliance with methlphenidate 54 mg every evening. Advised I will forward to Dr. Caryl Comes for review and we will call with any changes.

## 2021-02-13 NOTE — Progress Notes (Signed)
[facility] PREP Progress Report   Patient Details  Name: Erin Casey MRN: [id number] Date of Birth: [birth year] Age: 40 y.o. PCP: [name], MD  Vitals completed on 02/12/21  Vitals:   02/13/21 1230  BP: 122/76  Pulse: (!) 102  SpO2: 97%  Weight: 278 lb 3.2 oz (126.2 kg)  Height: 5' 10.5" (1.791 m)      [facility] Eval - 02/13/21 1200      Referral    Referring Provider [facility]    Reason for referral Inactivity    Program Start Date 02/19/21   T/TH 615p-730p x 12 wks     Measurement   Waist Circumference 49 inches    Hip Circumference 57 inches    Body fat --   did not measure due to pacer     Information for Trainer   Goals --   Be active 3x a wk for 45 min by end of program   Current Exercise None    Orthopedic Concerns Right arm, hand previous surgery, Hx of spine fx    Pertinent Medical History Mast cell activation syndrome, Neurocardiac syncope, Pacer, hx of Vtach    Current Barriers tolerance of exercise    Restrictions/Precautions Other   requests to monitor pulse frequently. Will use pulse ox   Medications that affect exercise Medication causing dizziness/drowsiness;Asthma inhaler      Timed Up and Go (TUGS)   Timed Up and Go Low risk <9 seconds      Mobility and Daily Activities   I find it easy to walk up or down two or more flights of stairs. 3    I have no trouble taking out the trash. 4    I do housework such as vacuuming and dusting on my own without difficulty. 4    I can easily lift a gallon of milk (8lbs). 4    I can easily walk a mile. 3    I have no trouble reaching into high cupboards or reaching down to pick up something from the floor. 4    I do not have trouble doing out-door work such as Armed forces logistics/support/administrative officer, raking leaves, or gardening. 3      Mobility and Daily Activities   I feel younger than my age. 3    I feel independent. 4    I feel energetic. 2    I live an active life.  3    I feel strong. 3    I feel healthy. 2    I feel  active as other people my age. 2      How fit and strong are you.   Fit and Strong Total Score 44          Past Medical History:  Diagnosis Date  . Anemia   . Depletion of volume of extracellular fluid   . Dysautonomia ([place]) 2003  . Essential hypertension 04/20/2013  . GERD (gastroesophageal reflux disease)   . Neurocardiogenic syncope   . Neurocardiogenic syncope 2003  . Neurologic cardiac syncope   . 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 Vitamin B12 deficiency 07/28/2018   Past Surgical History:  Procedure Laterality Date  . ABDOMINAL HYSTERECTOMY    . CESAREAN SECTION    . CHOLECYSTECTOMY    . hysterectomy    . LAPAROSCOPIC GASTRIC BANDING     With reversal   . PACEMAKER INSERTION    . PVC ABLATION N/A 03/12/2018   Procedure: PVC ABLATION;  Surgeon: Evans Lance, MD;  Location: [facility];  Service: Cardiovascular;  Laterality: N/A;  . TONSILLECTOMY AND ADENOIDECTOMY    . TUBAL LIGATION    . ULNAR TUNNEL RELEASE Right 07/28/2019   Procedure: Right carpal tunnel release, right cubital tunnel release;  Surgeon: Verner Mould, MD;  Location: [facility];  Service: Orthopedics;  Laterality: Right;   Social History   Tobacco Use  Smoking Status Never Smoker  Smokeless Tobacco Never Used    Eval completed on 02/12/21 at [facility] 02/13/2021, 12:39 PM

## 2021-02-14 ENCOUNTER — Ambulatory Visit (INDEPENDENT_AMBULATORY_CARE_PROVIDER_SITE_OTHER): Payer: 59

## 2021-02-14 ENCOUNTER — Encounter: Payer: Self-pay | Admitting: Sports Medicine

## 2021-02-14 ENCOUNTER — Other Ambulatory Visit: Payer: Self-pay

## 2021-02-14 ENCOUNTER — Ambulatory Visit: Payer: 59 | Admitting: Sports Medicine

## 2021-02-14 DIAGNOSIS — I472 Ventricular tachycardia: Secondary | ICD-10-CM | POA: Diagnosis not present

## 2021-02-14 DIAGNOSIS — M79674 Pain in right toe(s): Secondary | ICD-10-CM

## 2021-02-14 DIAGNOSIS — I4729 Other ventricular tachycardia: Secondary | ICD-10-CM

## 2021-02-14 DIAGNOSIS — B351 Tinea unguium: Secondary | ICD-10-CM | POA: Diagnosis not present

## 2021-02-14 DIAGNOSIS — M79675 Pain in left toe(s): Secondary | ICD-10-CM

## 2021-02-14 LAB — CUP PACEART REMOTE DEVICE CHECK
Battery Remaining Percentage: 45 %
Brady Statistic RA Percent Paced: 55 %
Brady Statistic RV Percent Paced: 0 %
Date Time Interrogation Session: 20220518065846
Implantable Lead Implant Date: 20130708
Implantable Lead Implant Date: 20130708
Implantable Lead Location: 753859
Implantable Lead Location: 753860
Implantable Lead Model: 350
Implantable Lead Model: 350
Implantable Lead Serial Number: 29222139
Implantable Lead Serial Number: 29231638
Implantable Pulse Generator Implant Date: 20130708
Lead Channel Impedance Value: 507 Ohm
Lead Channel Impedance Value: 507 Ohm
Lead Channel Pacing Threshold Amplitude: 0.7 V
Lead Channel Pacing Threshold Amplitude: 1 V
Lead Channel Pacing Threshold Pulse Width: 0.4 ms
Lead Channel Pacing Threshold Pulse Width: 0.4 ms
Lead Channel Sensing Intrinsic Amplitude: 1 mV
Lead Channel Sensing Intrinsic Amplitude: 9.8 mV
Lead Channel Setting Pacing Amplitude: 2 V
Lead Channel Setting Pacing Amplitude: 2.4 V
Lead Channel Setting Pacing Pulse Width: 0.4 ms
Pulse Gen Serial Number: 66303845

## 2021-02-14 NOTE — Progress Notes (Signed)
Subjective: Erin Casey is a 40 y.o. female patient seen today in office with complaint of mildly painful thickened and discolored nails. Patient is desiring treatment for nail changes; has tried OTC topicals and lamisil medication in the past with no improvement. Reports that nails are becoming difficult to manage because of the thickness that has worsened recently but has been going on for about 5 years that started after a pedicure. Admits to bumping nails before but never any lost of the nail(s). Patient has no other pedal complaints at this time.   Review of Systems  All other systems reviewed and are negative.    Patient Active Problem List   Diagnosis Date Noted  . NSVT (nonsustained ventricular tachycardia) ([place]) 07/19/2020  . Hirsutism 03/20/2020  . Adrenal adenoma, left 03/20/2020  . Vitamin B12 deficiency 07/28/2018  . Heart palpitations 01/03/2016  . 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 Essential hypertension 04/20/2013  . Pacemaker-Biotronik 04/20/2013    Current Outpatient Medications on File Prior to Visit  Medication Sig Dispense Refill  . albuterol (PROVENTIL HFA;VENTOLIN HFA) 108 (90 Base) MCG/ACT inhaler Inhale into the lungs every 4 (four) hours as needed for wheezing.     . Blood Glucose Monitoring Suppl (GLUCOCOM BLOOD GLUCOSE MONITOR) DEVI 1 each by XX route as directed One touch verio reflect meter    . Blood Glucose Monitoring Suppl (ONE TOUCH ULTRA 2) w/Device KIT     . Blood Glucose Monitoring Suppl (ONETOUCH VERIO REFLECT) w/Device KIT USE AS DIRECTED THREE TIMES A DAY FOR CBG MONITORING    . budesonide-formoterol (SYMBICORT) 160-4.5 MCG/ACT inhaler Inhale 2 puffs into the lungs 2 (two) times daily as needed (shortness of breath).    . cetirizine (ZYRTEC) 10 MG tablet Take 10 mg by mouth 2 (two) times daily.    . cyanocobalamin (,VITAMIN B-12,) 1000 MCG/ML injection Inject 1,000 mcg into the muscle every 14 (fourteen) days.     Marland Kitchen EPINEPHrine (EPIPEN  2-PAK) 0.3 mg/0.3 mL IJ SOAJ injection Inject 0.3 mLs (0.3 mg total) into the muscle once as needed for up to 1 dose (for severe allergic reaction). CAll 911 immediately if you have to use this medicine 1 each 1  . famotidine (PEPCID) 20 MG tablet Take 1 tablet (20 mg total) by mouth 2 (two) times daily. 10 tablet 0  . MAGNESIUM MALATE PO Take 202.5 mg by mouth daily.    . methylphenidate 54 MG PO CR tablet Take 1 tablet (54 mg total) by mouth every morning. 90 tablet 0  . ondansetron (ZOFRAN) 4 MG tablet Take 1 tablet (4 mg total) by mouth every 8 (eight) hours as needed. 30 tablet 0  . ONETOUCH VERIO test strip 1 each 3 (three) times daily.    . Prenatal 27-1 MG TABS     . Prenatal Vit-Fe Fumarate-FA (PRENATAL MULTIVITAMIN) TABS tablet Take 1 tablet by mouth daily at 12 noon.    Marland Kitchen QUERCETIN PO Take 200 mg by mouth daily. 4 tabs daily (1047m)    . Vitamin D, Ergocalciferol, (DRISDOL) 1.25 MG (50000 UT) CAPS capsule Take 50,000 Units by mouth every 7 (seven) days.    .Marland KitchenOZEMPIC, 0.25 OR 0.5 MG/DOSE, 2 MG/1.5ML SOPN SMARTSIG:0.25 Milligram(s) SUB-Q Once a Week (Patient not taking: Reported on 02/14/2021)    . polyethylene glycol powder (GLYCOLAX/MIRALAX) 17 GM/SCOOP powder Take by mouth. (Patient not taking: Reported on 02/14/2021)     No current facility-administered medications on file prior to visit.    Allergies  Allergen Reactions  .  Cinnamon Anaphylaxis  . Crab Extract Allergy Skin Test Anaphylaxis  . Eggs Or Egg-Derived Products Anaphylaxis  . Influenza Virus Vaccine Anaphylaxis  . Levaquin [Levofloxacin] Anaphylaxis  . Pineapple Rash    Swelling, rash, GI upset  . Polysorbate Anaphylaxis  . Pork-Derived Products Anaphylaxis  . Sulfa Antibiotics Hives, Nausea Only and Rash  . Amoxicillin-Pot Clavulanate Other (See Comments)    [Derm] [GI]  . Sulfamethoxazole Nausea And Vomiting and Rash    Objective: Physical Exam  General: Well developed, nourished, no acute distress, awake,  alert and oriented x 3  Vascular: Dorsalis pedis artery 2/4 bilateral, Posterior tibial artery 2/4 bilateral, skin temperature warm to warm proximal to distal bilateral lower extremities, no varicosities, pedal hair present bilateral.  Neurological: Gross sensation present via light touch bilateral.   Dermatological: Skin is warm, dry, and supple bilateral, Bilateral hallux and right 2nd toenails are tender, short thick, and discolored with mild subungal debris, no webspace macerations present bilateral, no open lesions present bilateral, no callus/corns/hyperkeratotic tissue present bilateral. No signs of infection bilateral.  Musculoskeletal: No boney deformities noted bilateral. Muscular strength within normal limits without painon range of motion. No pain with calf compression bilateral.  Assessment and Plan:  Problem List Items Addressed This Visit   None   Visit Diagnoses    Nail fungus    -  Primary   Toe pain, bilateral          -Examined patient -Discussed treatment options for painful dystrophic nails  -Fungal culture was obtained by removing a portion of the hard nail itself from each of the involved toenails using a sterile nail nipper and sent to [facility] lab. Patient tolerated the biopsy procedure well without discomfort or need for anesthesia.  -Patient to return in 4 weeks for follow up evaluation and discussion of fungal culture results or sooner if symptoms worsen.  Landis Martins, DPM

## 2021-02-20 ENCOUNTER — Telehealth: Payer: Self-pay

## 2021-02-20 NOTE — Telephone Encounter (Signed)
F/U message to pt this am as she did not come to PREP last PM Pt responded that she worked late. Has been exercising on own and wants to hold off on program for now.  Asked that if she changes her mind to let me know.

## 2021-03-04 ENCOUNTER — Other Ambulatory Visit: Payer: Self-pay

## 2021-03-04 ENCOUNTER — Ambulatory Visit (INDEPENDENT_AMBULATORY_CARE_PROVIDER_SITE_OTHER): Payer: 59 | Admitting: Family Medicine

## 2021-03-04 ENCOUNTER — Encounter: Payer: Self-pay | Admitting: Family Medicine

## 2021-03-04 DIAGNOSIS — R7303 Prediabetes: Secondary | ICD-10-CM

## 2021-03-04 DIAGNOSIS — Z6835 Body mass index (BMI) 35.0-35.9, adult: Secondary | ICD-10-CM

## 2021-03-04 NOTE — Progress Notes (Signed)
Medical Nutrition Therapy Patient has completed 2nd dose of COVID-19 vaccine. Appt start time: 1000 end time: 1100 (1 hour) Primary concerns today: Weight management.  Relevant history/background: Although referred by Horald Pollen, MD for weight management, Ms. Newhard has multiple other medical concerns, including vit B12 & vit D defic, gastroparesis, mast cell activation, POTS, impaired fasting glucose (and A1c of 5.8 on 01/15/21), and neuritis.  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  Some exercises puts her into v-tach, and she is still learning which she tolerates.  Ms. 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    Assessment:  Ms. 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  Lives with 40-YO daughter with who she shares some meals.  Usually buys lunch weekdays.   Learning Readiness: Change in progress; has lost ~8 lb through diet and ex efforts.    Usual eating pattern: 3 meals and 0-2 snacks per day.  Most meals are on the go.   Frequent foods and beverages: sweet tea (~4 X wk), water; chx, veg's.  Avoided foods: Cannot tolerate (including anaphylactic reaction) mayonnaise, sulfites, pork, deli meats, eggs (although tolerates if cooked into something), pineapple, cinnamon, turmeric, mushrooms, crab trying to limit bread recently).  Dislikes salad dressing.   Usual physical activity: Has started past couple months, although tolerance is variable probably related to [place] she had in late-January.  Walks on TM 30-60 min 2-3 X wk and does ~20 min strength training 1-2 X wk.  Needs to be cautious with intensity as builds fitness.  Had multiple injuries in 2020 and 2021.   Sleep: Estimates she gets 5-7 hours per night.    24-hr recall: (Up at 5 AM) B (8  AM)-   8 oz whey pro shake (30 g pro, 200 kcal), 1 1/2 tbsp coconut oil Snk ( AM)-   water L (11:30 PM)-  2-3 oz grilled chx, 1/4 c mashed pot's, water Snk ( PM)-  water D (6 PM)-  8 [employer] chx nuggets, water Snk ( PM)-  --- Typical day? No.  Very hectic day yesterday, although usually doesn't eat much on weekends.  Weekday lunches might be burgers (no bun) or chx, and dinners are often grilled chx, rice, sometimes veg's.    Nutritional Diagnosis:  NB-1.1 Food and nutrition-related knowledge deficit As related to weight management in context of dietary constraints.  As evidenced by patient's request for weight loss guidance while managing multiple food allergies and intolerances (and high sodium intake recommended for management of dysautonomia).  Handouts given during visit include:  After-Visit Summary (AVS)  Demonstrated degree of understanding via:  Teach Back  Barriers to learning/adherence to lifestyle change: Time constraints, poor appetite, and many non-tolerated foods.  Also has difficulty getting enough Na when she is choosing healthy foods.   Monitoring/Evaluation:  Dietary intake, exercise, and body weight in 5 week(s).

## 2021-03-04 NOTE — Patient Instructions (Addendum)
  Carbohydrate includes starch, sugar, and fiber.  Of these, only sugar and starch raise blood glucose.  (Fiber is found in fruits, vegetables [especially skin, seeds, and stalks], whole grains, and beans.)   Starchy (carb) foods: Bread, rice, pasta, potatoes, corn, cereal, grits, crackers, bagels, muffins, all baked goods.  (Fruit, milk, and yogurt also have carbohydrate, but most of these foods will not spike your blood sugar as most starchy or sweet foods will.)  A few fruits do cause high blood sugars; use small portions of bananas (limit to 1/2 at a time), grapes, watermelon, and oranges.   Protein foods: Meat, fish, poultry, eggs, dairy foods, and beans such as pinto and kidney beans (beans also provide carbohydrate).   1. Eat at least 3 REAL meals and 1-2 snacks per day. Eat breakfast within the first hour of getting up.  Have something to eat at least every 5 hours while awake.   2. Limit starchy foods to ONE per meal or snack. ONE portion of a starchy food is equal to the following:   - ONE slice of bread (or its equivalent, such as half of a hamburger bun).   - 1/2 cup of a "scoopable" starchy food such as potatoes or rice.   - 15 grams of Total Carbohydrate as shown on food label.   - Every 4 ounces of a sweet drink (including fruit juice). NOTE:  Carb foods at meals or snacks are optional, but be sure your meal or snack provides enough to satisfy you both physiologically and psychologically.   3. Include twice the volume of vegetables as protein or carbohydrate foods for BOTH lunch and dinner as often as you can.   - Fresh or frozen vegetables are best.   - Keep frozen vegetables on hand for a quick option.   ]  It is STRONGLY recommended that you DOCUMENT your progress on the above goals.  For example, record # of REAL meals per day and # of veg servings per day.  (You may also want to include # minutes of exercise.)  We manage best what we monitor!    Additional considerations:   TASTE PREFERENCES ARE LEARNED.  This means it will get easier to choose foods you know are good for you if you are exposed to them enough.    Substitue some avocado (or other mono-unsaturated) oil for some of your coconut oil, which might help lower your LDL without lowering your HDL.    Poor quality fats can contribute to inflammation.  The best quality fats include XV olive oil, avocados, canola oil, and non-fried fatty fish.  Also good in moderation are nut and seed oils.  Fast foods generally provide poor-quality fats.    Snack ideas: Fruit +/- salted nuts, string cheese, peanut butter on crackers, protein bars/shake (e.g., Kind Bar), 1/2 sandwich.    Sandwiches:  Cook your own chicken, and freeze extra.    Follow-up in-office appt on Tues, July 12 at 9 AM.

## 2021-03-08 NOTE — Progress Notes (Signed)
Remote pacemaker transmission.   

## 2021-03-11 DIAGNOSIS — B009 Herpesviral infection, unspecified: Secondary | ICD-10-CM | POA: Insufficient documentation

## 2021-03-28 ENCOUNTER — Other Ambulatory Visit: Payer: Self-pay

## 2021-03-28 ENCOUNTER — Encounter: Payer: Self-pay | Admitting: Sports Medicine

## 2021-03-28 ENCOUNTER — Ambulatory Visit: Payer: 59 | Admitting: Sports Medicine

## 2021-03-28 DIAGNOSIS — M79675 Pain in left toe(s): Secondary | ICD-10-CM | POA: Diagnosis not present

## 2021-03-28 DIAGNOSIS — B351 Tinea unguium: Secondary | ICD-10-CM

## 2021-03-28 DIAGNOSIS — M79674 Pain in right toe(s): Secondary | ICD-10-CM | POA: Diagnosis not present

## 2021-03-28 NOTE — Progress Notes (Signed)
Subjective: Erin Casey is a 40 y.o. female patient seen today in office for fungal culture results. Patient has no other pedal complaints at this time.   Patient Active Problem List   Diagnosis Date Noted   NSVT (nonsustained ventricular tachycardia) ([place]) 07/19/2020   Hirsutism 03/20/2020   Adrenal adenoma, left 03/20/2020   Vitamin B12 deficiency 07/28/2018   Heart palpitations 01/03/2016   Neurocardiogenic syncope 04/20/2013   Essential hypertension 04/20/2013   Pacemaker-Biotronik 04/20/2013    Current Outpatient Medications on File Prior to Visit  Medication Sig Dispense Refill   albuterol (PROVENTIL HFA;VENTOLIN HFA) 108 (90 Base) MCG/ACT inhaler Inhale into the lungs every 4 (four) hours as needed for wheezing.      Blood Glucose Monitoring Suppl (GLUCOCOM BLOOD GLUCOSE MONITOR) DEVI 1 each by XX route as directed One touch verio reflect meter     Blood Glucose Monitoring Suppl (ONE TOUCH ULTRA 2) w/Device KIT      Blood Glucose Monitoring Suppl (ONETOUCH VERIO REFLECT) w/Device KIT USE AS DIRECTED THREE TIMES A DAY FOR CBG MONITORING     budesonide-formoterol (SYMBICORT) 160-4.5 MCG/ACT inhaler Inhale 2 puffs into the lungs 2 (two) times daily as needed (shortness of breath).     cetirizine (ZYRTEC) 10 MG tablet Take 10 mg by mouth 2 (two) times daily.     cyanocobalamin (,VITAMIN B-12,) 1000 MCG/ML injection Inject 1,000 mcg into the muscle every 14 (fourteen) days.      EPINEPHrine (EPIPEN 2-PAK) 0.3 mg/0.3 mL IJ SOAJ injection Inject 0.3 mLs (0.3 mg total) into the muscle once as needed for up to 1 dose (for severe allergic reaction). CAll 911 immediately if you have to use this medicine 1 each 1   famotidine (PEPCID) 20 MG tablet Take 1 tablet (20 mg total) by mouth 2 (two) times daily. 10 tablet 0   MAGNESIUM MALATE PO Take 202.5 mg by mouth daily.     methylphenidate 54 MG PO CR tablet Take 1 tablet (54 mg total) by mouth every morning. 90 tablet 0   ondansetron (ZOFRAN)  4 MG tablet Take 1 tablet (4 mg total) by mouth every 8 (eight) hours as needed. 30 tablet 0   ONETOUCH VERIO test strip 1 each 3 (three) times daily.     OZEMPIC, 0.25 OR 0.5 MG/DOSE, 2 MG/1.5ML SOPN SMARTSIG:0.25 Milligram(s) SUB-Q Once a Week (Patient not taking: Reported on 02/14/2021)     polyethylene glycol powder (GLYCOLAX/MIRALAX) 17 GM/SCOOP powder Take by mouth. (Patient not taking: Reported on 02/14/2021)     Prenatal 27-1 MG TABS      Prenatal Vit-Fe Fumarate-FA (PRENATAL MULTIVITAMIN) TABS tablet Take 1 tablet by mouth daily at 12 noon.     QUERCETIN PO Take 200 mg by mouth daily. 4 tabs daily (1037m)     Vitamin D, Ergocalciferol, (DRISDOL) 1.25 MG (50000 UT) CAPS capsule Take 50,000 Units by mouth every 7 (seven) days.     No current facility-administered medications on file prior to visit.    Allergies  Allergen Reactions   Cinnamon Anaphylaxis   Crab Extract Allergy Skin Test Anaphylaxis   Eggs Or Egg-Derived Products Anaphylaxis   Influenza Virus Vaccine Anaphylaxis   Levaquin [Levofloxacin] Anaphylaxis   Pineapple Rash    Swelling, rash, GI upset   Polysorbate Anaphylaxis   Pork-Derived Products Anaphylaxis   Sulfa Antibiotics Hives, Nausea Only and Rash   Amoxicillin-Pot Clavulanate Other (See Comments)    [Derm] [GI]   Sulfamethoxazole Nausea And Vomiting and Rash  Objective: Physical Exam  General: Well developed, nourished, no acute distress, awake, alert and oriented x 3  Vascular: Dorsalis pedis artery 2/4 bilateral, Posterior tibial artery 2/4 bilateral, skin temperature warm to warm proximal to distal bilateral lower extremities, no varicosities, pedal hair present bilateral.   Neurological: Gross sensation present via light touch bilateral.   Dermatological: Skin is warm, dry, and supple bilateral, Bilateral hallux and right 2nd toenails are tender, short thick, and discolored with mild subungal debris, no webspace macerations present bilateral, no  open lesions present bilateral, no callus/corns/hyperkeratotic tissue present bilateral. No signs of infection bilateral.   Musculoskeletal: No boney deformities noted bilateral. Muscular strength within normal limits without painon range of motion. No pain with calf compression bilateral.  Fungal culture + Fungus and microtrauma   Assessment and Plan:  Problem List Items Addressed This Visit   None Visit Diagnoses     Nail fungus    -  Primary   Toe pain, bilateral           -Examined patient -Discussed treatment options for painful mycotic nails -Patient opt for oral Lamisil with full understanding of medication risks; ordered LFTs for review if within normal limits will proceed with sending Rx to pharmacy for lamisil 250mg PO daily. Anticipate 12 week course.  -Advised good hygiene habits -Patient to return in 6 weeks for follow up evaluation or sooner if symptoms worsen.  Titorya Stover, DPM  

## 2021-04-03 LAB — HEPATIC FUNCTION PANEL
AG Ratio: 1.7 (calc) (ref 1.0–2.5)
ALT: 17 U/L (ref 6–29)
AST: 13 U/L (ref 10–30)
Albumin: 4 g/dL (ref 3.6–5.1)
Alkaline phosphatase (APISO): 37 U/L (ref 31–125)
Bilirubin, Direct: 0.1 mg/dL (ref 0.0–0.2)
Globulin: 2.3 g/dL (calc) (ref 1.9–3.7)
Indirect Bilirubin: 0.2 mg/dL (calc) (ref 0.2–1.2)
Total Bilirubin: 0.3 mg/dL (ref 0.2–1.2)
Total Protein: 6.3 g/dL (ref 6.1–8.1)

## 2021-04-04 ENCOUNTER — Other Ambulatory Visit: Payer: Self-pay | Admitting: Sports Medicine

## 2021-04-04 MED ORDER — TERBINAFINE HCL 250 MG PO TABS: 250.0000 mg | ORAL_TABLET | Freq: Every day | ORAL | 0 refills | Status: DC

## 2021-04-04 NOTE — Progress Notes (Signed)
LFTs normal. Lamisil PO sent to pharmacy

## 2021-04-09 ENCOUNTER — Ambulatory Visit: Payer: 59 | Admitting: Family Medicine

## 2021-04-11 ENCOUNTER — Telehealth: Payer: Self-pay | Admitting: Student

## 2021-05-09 ENCOUNTER — Ambulatory Visit: Payer: 59 | Admitting: Sports Medicine

## 2021-05-16 ENCOUNTER — Ambulatory Visit (INDEPENDENT_AMBULATORY_CARE_PROVIDER_SITE_OTHER): Payer: 59

## 2021-05-16 DIAGNOSIS — I472 Ventricular tachycardia: Secondary | ICD-10-CM

## 2021-05-16 DIAGNOSIS — I4729 Other ventricular tachycardia: Secondary | ICD-10-CM

## 2021-05-16 LAB — CUP PACEART REMOTE DEVICE CHECK
Battery Remaining Percentage: 40 %
Brady Statistic RA Percent Paced: 57 %
Brady Statistic RV Percent Paced: 0 %
Date Time Interrogation Session: 20220817081659
Implantable Lead Implant Date: 20130708
Implantable Lead Implant Date: 20130708
Implantable Lead Location: 753859
Implantable Lead Location: 753860
Implantable Lead Model: 350
Implantable Lead Model: 350
Implantable Lead Serial Number: 29222139
Implantable Lead Serial Number: 29231638
Implantable Pulse Generator Implant Date: 20130708
Lead Channel Impedance Value: 507 Ohm
Lead Channel Impedance Value: 527 Ohm
Lead Channel Pacing Threshold Amplitude: 0.7 V
Lead Channel Pacing Threshold Amplitude: 0.8 V
Lead Channel Pacing Threshold Pulse Width: 0.4 ms
Lead Channel Pacing Threshold Pulse Width: 0.4 ms
Lead Channel Sensing Intrinsic Amplitude: 1.8 mV
Lead Channel Sensing Intrinsic Amplitude: 10.2 mV
Lead Channel Setting Pacing Amplitude: 2 V
Lead Channel Setting Pacing Amplitude: 2.4 V
Lead Channel Setting Pacing Pulse Width: 0.4 ms
Pulse Gen Serial Number: 66303845

## 2021-05-20 ENCOUNTER — Telehealth: Payer: Self-pay | Admitting: Internal Medicine

## 2021-05-20 NOTE — Telephone Encounter (Signed)
New message:    Patient calling stating that she has just got over 14 days of covid. Patient is asking do she need to do a ECHO

## 2021-05-23 ENCOUNTER — Emergency Department (HOSPITAL_BASED_OUTPATIENT_CLINIC_OR_DEPARTMENT_OTHER): Payer: 59

## 2021-05-23 ENCOUNTER — Encounter (HOSPITAL_BASED_OUTPATIENT_CLINIC_OR_DEPARTMENT_OTHER): Payer: Self-pay

## 2021-05-23 ENCOUNTER — Other Ambulatory Visit: Payer: Self-pay

## 2021-05-23 ENCOUNTER — Emergency Department (HOSPITAL_BASED_OUTPATIENT_CLINIC_OR_DEPARTMENT_OTHER)
Admission: EM | Admit: 2021-05-23 | Discharge: 2021-05-23 | Disposition: A | Discharge status: Home / Self Care | Payer: 59 | Attending: Emergency Medicine | Admitting: Emergency Medicine

## 2021-05-23 DIAGNOSIS — R0602 Shortness of breath: Secondary | ICD-10-CM | POA: Diagnosis not present

## 2021-05-23 DIAGNOSIS — I10 Essential (primary) hypertension: Secondary | ICD-10-CM | POA: Insufficient documentation

## 2021-05-23 DIAGNOSIS — Q891 Congenital malformations of adrenal gland: Secondary | ICD-10-CM | POA: Insufficient documentation

## 2021-05-23 DIAGNOSIS — Z79899 Other long term (current) drug therapy: Secondary | ICD-10-CM | POA: Insufficient documentation

## 2021-05-23 DIAGNOSIS — Z950 Presence of cardiac pacemaker: Secondary | ICD-10-CM | POA: Diagnosis not present

## 2021-05-23 DIAGNOSIS — R072 Precordial pain: Secondary | ICD-10-CM | POA: Insufficient documentation

## 2021-05-23 DIAGNOSIS — R079 Chest pain, unspecified: Secondary | ICD-10-CM

## 2021-05-23 LAB — CBC
HCT: 45.5 % (ref 36.0–46.0)
Hemoglobin: 15.1 g/dL — ABNORMAL HIGH (ref 12.0–15.0)
MCH: 31.4 pg (ref 26.0–34.0)
MCHC: 33.2 g/dL (ref 30.0–36.0)
MCV: 94.6 fL (ref 80.0–100.0)
Platelets: 207 10*3/uL (ref 150–400)
RBC: 4.81 MIL/uL (ref 3.87–5.11)
RDW: 12.2 % (ref 11.5–15.5)
WBC: 7.6 10*3/uL (ref 4.0–10.5)
nRBC: 0 % (ref 0.0–0.2)

## 2021-05-23 LAB — BASIC METABOLIC PANEL
Anion gap: 8 (ref 5–15)
BUN: 13 mg/dL (ref 6–20)
CO2: 25 mmol/L (ref 22–32)
Calcium: 8.4 mg/dL — ABNORMAL LOW (ref 8.9–10.3)
Chloride: 105 mmol/L (ref 98–111)
Creatinine, Ser: 0.72 mg/dL (ref 0.44–1.00)
GFR, Estimated: >60 mL/min (ref >60)
Glucose, Bld: 126 mg/dL — ABNORMAL HIGH (ref 70–99)
Potassium: 4.2 mmol/L (ref 3.5–5.1)
Sodium: 138 mmol/L (ref 135–145)

## 2021-05-23 LAB — TROPONIN I (HIGH SENSITIVITY)
Troponin I (High Sensitivity): <2 ng/L (ref <18)
Troponin I (High Sensitivity): <2 ng/L (ref <18)

## 2021-05-23 LAB — BRAIN NATRIURETIC PEPTIDE: B Natriuretic Peptide: 13.5 pg/mL (ref 0.0–100.0)

## 2021-05-23 MED ORDER — IOHEXOL 350 MG/ML SOLN
100.0000 mL | Freq: Once | INTRAVENOUS | Status: AC | PRN
  Administered 2021-05-23: 100 mL via INTRAVENOUS

## 2021-05-23 MED ORDER — BENZONATATE 100 MG PO CAPS: 100.0000 mg | ORAL_CAPSULE | Freq: Three times a day (TID) | ORAL | 0 refills | Status: DC

## 2021-05-23 MED ORDER — ALBUTEROL SULFATE HFA 108 (90 BASE) MCG/ACT IN AERS
2.0000 | INHALATION_SPRAY | Freq: Once | RESPIRATORY_TRACT | Status: AC
  Administered 2021-05-23: 2 via RESPIRATORY_TRACT
  Filled 2021-05-23: qty 6.7

## 2021-05-23 NOTE — ED Triage Notes (Signed)
Pt c/o CP x 35 min-states she was +covid 8/15-covid sx started 8/8-NAD-steady gait

## 2021-05-23 NOTE — ED Provider Notes (Signed)
[facility] Provider Note   CSN: [id number] Arrival date & time: 05/23/21  1411     History Chief Complaint  Patient presents with   Chest Pain    Erin Casey is a 40 y.o. female with PMHx HTN, GERD, anemia, dysautonomia, neurocardiogenic syncope, and hx of pacemaker who presents to the ED today with complaint of gradual onset, constant, dull/achy, substernal chest pain that began earlier today while at work. Pt also complains of shortness of breath, worse with exertion. She reports she began having COVID Like symptoms on 08/08 and tested positive the next week on 08/15. She was prescribed Paxlovid and finished it 4-5 days ago. Pt went back to work on Monday of this week and besides some fatigue has felt okay until today. She does mention she had some shortness of breath while she had COVID however that went away until today. She endorses hx of DVT in LUE 3-4 years ago likely s/2 IV placement. Pt is not currently anticoagulated. She denies any alleviating or exacerbating factors to the chest pain.   The history is provided by the patient and medical records.      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   Vitamin B12 deficiency 07/28/2018    Patient Active Problem List   Diagnosis Date Noted   NSVT (nonsustained ventricular tachycardia) ([place]) 07/19/2020   Hirsutism 03/20/2020   Adrenal adenoma, left 03/20/2020   Vitamin B12 deficiency 07/28/2018   Heart palpitations 01/03/2016   Neurocardiogenic syncope 04/20/2013   Essential hypertension 04/20/2013   Pacemaker-Biotronik 04/20/2013    Past Surgical History:  Procedure Laterality Date   ABDOMINAL HYSTERECTOMY      CESAREAN SECTION     CHOLECYSTECTOMY     hysterectomy     LAPAROSCOPIC GASTRIC BANDING     With reversal    PACEMAKER INSERTION     PVC ABLATION N/A 03/12/2018   Procedure: PVC ABLATION;  Surgeon: Evans Lance, MD;  Location: [facility];  Service: Cardiovascular;  Laterality: N/A;   TONSILLECTOMY AND ADENOIDECTOMY     TUBAL LIGATION     ULNAR TUNNEL RELEASE Right 07/28/2019   Procedure: Right carpal tunnel release, right cubital tunnel release;  Surgeon: Verner Mould, MD;  Location: [facility];  Service: Orthopedics;  Laterality: Right;     OB History     Gravida  1   Para      Term      Preterm      AB      Living         SAB      IAB      Ectopic      Multiple      Live Births              Family History  Problem Relation Age of Onset   Diabetes Mother    Hyperlipidemia Mother    Hypertension Father    Heart disease Father    Hyperlipidemia Father    Mental illness Sister    Cancer Maternal Grandmother    Hyperlipidemia Maternal Grandmother    Heart disease Maternal Grandfather    Hyperlipidemia Maternal Grandfather  Hyperlipidemia Paternal Grandmother    Hypertension Paternal Grandmother    Heart disease Paternal Grandfather    Hyperlipidemia Paternal Grandfather     Social History   Tobacco Use   Smoking status: Never   Smokeless tobacco: Never  Vaping Use   Vaping Use: Never used  Substance Use Topics   Alcohol use: Yes    Comment: occassionally   Drug use: Never    Home Medications Prior to Admission medications   Medication Sig Start Date End Date Taking? Authorizing Provider  benzonatate (TESSALON) 100 MG capsule Take 1 capsule (100 mg total) by mouth every 8 (eight) hours. 05/23/21  Yes Shyasia Funches, PA-C  acyclovir (ZOVIRAX) 400 MG tablet Take 400 mg by mouth 3 (three) times daily. 03/05/21   [provider]  albuterol (PROVENTIL HFA;VENTOLIN HFA) 108 (90 Base) MCG/ACT inhaler Inhale into the lungs  every 4 (four) hours as needed for wheezing.     [provider]  Blood Glucose Monitoring Suppl (GLUCOCOM BLOOD GLUCOSE MONITOR) [place] 1 each by XX route as directed One touch verio reflect meter    [provider]  Blood Glucose Monitoring Suppl (ONE TOUCH ULTRA 2) w/Device KIT  11/21/18   [provider]  Blood Glucose Monitoring Suppl (ONETOUCH VERIO REFLECT) w/Device KIT USE AS DIRECTED THREE TIMES A DAY FOR CBG MONITORING 05/14/20   [provider]  budesonide-formoterol (SYMBICORT) 160-4.5 MCG/ACT inhaler Inhale 2 puffs into the lungs 2 (two) times daily as needed (shortness of breath).    [provider]  cetirizine (ZYRTEC) 10 MG tablet Take 10 mg by mouth 2 (two) times daily.    [provider]  cetirizine (ZYRTEC) 10 MG tablet Take by mouth.    [provider]  cyanocobalamin (,VITAMIN B-12,) 1000 MCG/ML injection Inject 1,000 mcg into the muscle every 14 (fourteen) days.     [provider]  EPINEPHrine (EPIPEN 2-PAK) 0.3 mg/0.3 mL IJ SOAJ injection Inject 0.3 mLs (0.3 mg total) into the muscle once as needed for up to 1 dose (for severe allergic reaction). CAll 911 immediately if you have to use this medicine 01/23/20   Margarita Mail, PA-C  famotidine (PEPCID) 20 MG tablet Take 1 tablet (20 mg total) by mouth 2 (two) times daily. 01/23/20   Margarita Mail, PA-C  famotidine (PEPCID) 20 MG tablet Take by mouth.    [provider]  fluconazole (DIFLUCAN) 150 MG tablet Take by mouth. 03/11/21   [provider]  folic acid (FOLVITE) 1 MG tablet Take 1 mg by mouth daily. 01/15/21   [provider]  MAGNESIUM MALATE PO Take 202.5 mg by mouth daily.    [provider]  MAGNESIUM MALATE PO Take by mouth.    [provider]  methylphenidate (CONCERTA) 54 MG PO CR tablet Take by mouth.    [provider]  methylphenidate 54 MG PO CR tablet Take 1 tablet (54 mg total) by mouth  every morning. 01/11/19   Deboraha Sprang, MD  methylPREDNISolone (MEDROL) 4 MG TBPK tablet See admin instructions. 03/06/20   [provider]  ondansetron (ZOFRAN) 4 MG tablet Take 1 tablet (4 mg total) by mouth every 8 (eight) hours as needed. 07/28/19   Verner Mould, MD  [facility] VERIO test strip 1 each 3 (three) times daily. 07/17/20   [provider]  OZEMPIC, 0.25 OR 0.5 MG/DOSE, 2 MG/1.5ML SOPN SMARTSIG:0.25 Milligram(s) SUB-Q Once a Week Patient not taking: Reported on 02/14/2021 11/05/20  [provider]  polyethylene glycol powder (GLYCOLAX/MIRALAX) 17 GM/SCOOP powder Take by mouth. Patient not taking: Reported on 02/14/2021    [provider]  Prenatal 27-1 MG TABS  08/25/18   [provider]  Prenatal Vit-Fe Fumarate-FA (PRENATAL MULTIVITAMIN) TABS tablet Take 1 tablet by mouth daily at 12 noon.    [provider]  Quercetin Dihydrate POWD Take by mouth.    [provider]  QUERCETIN PO Take 200 mg by mouth daily. 4 tabs daily (1062m)    [provider]  terbinafine (LAMISIL) 250 MG tablet Take 1 tablet (250 mg total) by mouth daily. 04/04/21   SLandis Martins DPM  Vitamin D, Ergocalciferol, (DRISDOL) 1.25 MG (50000 UT) CAPS capsule Take 50,000 Units by mouth every 7 (seven) days.    [provider]    Allergies    Cinnamon, Crab extract allergy skin test, Eggs or egg-derived products, Influenza virus vaccine, Levaquin [levofloxacin], Pineapple, Polysorbate, Pork-derived products, Sulfa antibiotics, Amoxicillin-pot clavulanate, and Sulfamethoxazole  Review of Systems   Review of Systems  Constitutional:  Positive for fatigue. Negative for chills, diaphoresis and fever.  Respiratory:  Positive for shortness of breath. Negative for cough.   Cardiovascular:  Positive for chest pain. Negative for palpitations and leg swelling.  Gastrointestinal:  Negative for nausea and vomiting.  All other systems  reviewed and are negative.  Physical Exam Updated Vital Signs BP 115/69   Pulse 71   Temp 98.2 F (36.8 C) (Oral)   Resp 15   Ht 5' 10"  (1.778 m)   Wt 120.2 kg   SpO2 99%   BMI 38.02 kg/m   Physical Exam Vitals and nursing note reviewed.  Constitutional:      Appearance: She is obese. She is not ill-appearing or diaphoretic.  HENT:     Head: Normocephalic and atraumatic.  Eyes:     Conjunctiva/sclera: Conjunctivae normal.  Cardiovascular:     Rate and Rhythm: Normal rate and regular rhythm.     Pulses:          Radial pulses are 2+ on the right side and 2+ on the left side.       Dorsalis pedis pulses are 2+ on the right side and 2+ on the left side.     Heart sounds: Normal heart sounds.  Pulmonary:     Effort: Pulmonary effort is normal.     Breath sounds: Normal breath sounds. No decreased breath sounds, wheezing, rhonchi or rales.  Abdominal:     Palpations: Abdomen is soft.     Tenderness: There is no abdominal tenderness.  Musculoskeletal:     Cervical back: Neck supple.     Right lower leg: No edema.     Left lower leg: No edema.  Skin:    General: Skin is warm and dry.  Neurological:     Mental Status: She is alert.    ED Results / Procedures / Treatments   Labs (all labs ordered are listed, but only abnormal results are displayed) Labs Reviewed  BASIC METABOLIC PANEL - Abnormal; Notable for the following components:      Result Value   Glucose, Bld 126 (*)    Calcium 8.4 (*)    All other components within normal limits  CBC - Abnormal; Notable for the following components:   Hemoglobin 15.1 (*)    All other components within normal limits  BRAIN NATRIURETIC PEPTIDE  TROPONIN I (HIGH SENSITIVITY)  TROPONIN I (HIGH SENSITIVITY)    EKG  EKG Interpretation  Date/Time:  Thursday May 23 2021 14:27:01 EDT Ventricular Rate:  97 PR Interval:  148 QRS Duration: 82 QT Interval:  332 QTC Calculation: 421 R Axis:   0 Text Interpretation: Normal  sinus rhythm Possible Anterior infarct , age undetermined Abnormal ECG No significant change since last tracing Confirmed by Wandra Arthurs [phone]) on 05/23/2021 5:56:36 PM  Radiology DG Chest 2 View  Result Date: 05/23/2021 CLINICAL DATA:  Sudden onset of chest pain today.  Chest pressure. EXAM: CHEST - 2 VIEW COMPARISON:  11/09/2020 FINDINGS: Heart size is normal. Dual lead pacemaker in place with leads in the region of the right atrium and right ventricle. Mediastinal shadows are otherwise normal. The lungs are clear. The vascularity is normal. No effusions. IMPRESSION: No active disease.  Dual lead pacemaker as seen previously. Electronically Signed   By: Nelson Chimes M.D.   On: 05/23/2021 15:29   CT Angio Chest PE W/Cm &/Or Wo Cm  Result Date: 05/23/2021 CLINICAL DATA:  Pulmonary embolus suspected with high probability. Positive for COVID on 08/15 with symptoms beginning on 08/08. History of prior right upper extremity DVT. EXAM: CT ANGIOGRAPHY CHEST WITH CONTRAST TECHNIQUE: Multidetector CT imaging of the chest was performed using the standard protocol during bolus administration of intravenous contrast. Multiplanar CT image reconstructions and MIPs were obtained to evaluate the vascular anatomy. CONTRAST:  116m OMNIPAQUE IOHEXOL 350 MG/ML SOLN COMPARISON:  Chest radiograph 05/23/2021.  CT chest 07/13/2018 FINDINGS: Cardiovascular: There is moderately good opacification of the central and segmental pulmonary arteries. No focal filling defects. No evidence of significant pulmonary embolus. Normal heart size. No pericardial effusions. Cardiac pacemaker. Normal caliber thoracic aorta. No aortic dissection. Great vessel origins are patent. Mediastinum/Nodes: Esophagus is decompressed. No significant lymphadenopathy. Thyroid gland is unremarkable. Lungs/Pleura: Lungs are clear. No pleural effusions. No pneumothorax. Airways are patent. Upper Abdomen: Subcentimeter low-attenuation lesions in the liver are too  small to characterize but likely to represent cysts or hemangiomas. Left adrenal gland nodule measuring 1.8 cm diameter. This was not definitely present on a previous CT abdomen and pelvis from 02/12/2011. Fell shoulder unit measurements are indeterminate on contrast-enhanced imaging. Musculoskeletal: No chest wall abnormality. No acute or significant osseous findings. Review of the MIP images confirms the above findings. IMPRESSION: 1. No evidence of significant pulmonary embolus. 2. No evidence of active pulmonary disease. 3. Incidental note of a 1.8 cm diameter left adrenal gland nodule. Based on size criteria, this is probably benign. Consider 12 month follow-up adrenal CT. Electronically Signed   By: WLucienne CapersM.D.   On: 05/23/2021 17:48    Procedures Procedures   Medications Ordered in ED Medications  albuterol (VENTOLIN HFA) 108 (90 Base) MCG/ACT inhaler 2 puff (has no administration in time range)  iohexol (OMNIPAQUE) 350 MG/ML injection 100 mL (100 mLs Intravenous Contrast Given 05/23/21 1706)    ED Course  I have reviewed the triage vital signs and the nursing notes.  Pertinent labs & imaging results that were available during my care of the patient were reviewed by me and considered in my medical decision making (see chart for details).    MDM Rules/Calculators/A&P                           40year old female presenting to the ED today with complaints of substernal chest pain and SOB that began earlier today. Recent COVID infection treated with Paxlovid. Hx of pacemaker placement s/2 cardiogenic  syncope. On arrival to the ED VSS. Pt is nontachycardic with a rate of 99 initially and repeat 78. Remainder of vitals stable. EKG without acute ischemic changes. CXR clear with pacemaker placement appreciated. CBC without anemia with a hgb 15.1; no leukocytosis present. BMP without electrolyte abnormalities. Troponin < 2. Given chest pain and recent COVID infxn there is concern for PE.  Pt endorses DVT about 3-4 years ago however is not anticoagulated currently. Will plan for CTA at this time as I suspect her D dimer would be elevated with recent COVID. PT mentions she had COVID earlier this year as well and had an ECHO with decreased EF. No pulmonary vascular congestion appreciated on CXR however will add on BNP. Will plan to interrogate pacemaker as well.   We do not have pacemaker interrogator compatible with pt's pacemaker at this facility. Given pt has not passed out do not feel this is necessary today .  Repeat troponin < 2 BNP 13.5 CTA: IMPRESSION:  1. No evidence of significant pulmonary embolus.  2. No evidence of active pulmonary disease.  3. Incidental note of a 1.8 cm diameter left adrenal gland nodule.  Based on size criteria, this is probably benign. Consider 12 month  follow-up adrenal CT.   Pt reports she is aware of the adrenal gland nodule. She found out about it on a CT scan last year when she broke her back - per chart review CT scan in Care Everywhere with nodule measuring 1.6 cm; slightly increased today. Have recommended she follow up with PCP for same.   Otherwise workup reassuring. Will provide albuterol inhaler for symptomatic relief. Pt reports that while here she began coughing some as well - will discharge with tessalon perles. Pt instructed to follow up with PCP regarding ED visit. She is in agreement with plan and stable for discharge home.   This note was prepared using Dragon voice recognition software and may include unintentional dictation errors due to the inherent limitations of voice recognition software.   Final Clinical Impression(s) / ED Diagnoses Final diagnoses:  Nonspecific chest pain  SOB (shortness of breath)  Adrenal gland anomaly    Rx / DC Orders ED Discharge Orders          Ordered    benzonatate (TESSALON) 100 MG capsule  Every 8 hours        05/23/21 1816             Discharge Instructions      Please  follow up with your PCP regarding ED visit today/further evaluation of your adrenal gland seen on CT scan  Pick up cough medication and take as needed. Use the inhaler as needed for shortness of breath  Return to the ED for any new/worsening symptoms       Eustaquio Maize, PA-C 05/23/21 1818    Drenda Freeze, MD 05/29/21 2125

## 2021-05-23 NOTE — ED Notes (Signed)
Patient Alert and oriented to baseline. Stable and ambulatory to baseline. Patient verbalized understanding of the discharge instructions.  Patient belongings were taken by the patient.   

## 2021-05-23 NOTE — Telephone Encounter (Signed)
I dont know of any specific indiication for echo post covid-- if she had chest pain or ongoing sob might be worth to exclude the possibilty of cardiac involve ment

## 2021-05-23 NOTE — ED Notes (Signed)
Pt returned from CT °

## 2021-05-23 NOTE — Discharge Instructions (Signed)
Please follow up with your PCP regarding ED visit today/further evaluation of your adrenal gland seen on CT scan  Pick up cough medication and take as needed. Use the inhaler as needed for shortness of breath  Return to the ED for any new/worsening symptoms

## 2021-06-05 NOTE — Progress Notes (Signed)
Remote pacemaker transmission.   

## 2021-06-20 ENCOUNTER — Ambulatory Visit (INDEPENDENT_AMBULATORY_CARE_PROVIDER_SITE_OTHER): Payer: 59 | Admitting: Sports Medicine

## 2021-06-20 ENCOUNTER — Other Ambulatory Visit: Payer: Self-pay

## 2021-06-20 DIAGNOSIS — M79675 Pain in left toe(s): Secondary | ICD-10-CM

## 2021-06-20 DIAGNOSIS — Z79899 Other long term (current) drug therapy: Secondary | ICD-10-CM | POA: Diagnosis not present

## 2021-06-20 DIAGNOSIS — M79674 Pain in right toe(s): Secondary | ICD-10-CM

## 2021-06-20 DIAGNOSIS — B351 Tinea unguium: Secondary | ICD-10-CM | POA: Diagnosis not present

## 2021-06-20 NOTE — Progress Notes (Signed)
Subjective: Erin Casey is a 40 y.o. female patient seen today in office for follow-up evaluation of fungal nails.  Patient reports that Lamisil has been effective but she stopped it for about a month when she got sick with COVID and had to be on 2 rounds of steroids wants to resume it but wanted to discuss with me before doing so.  States that when she was taking Lamisil which seem very effective and she did not have any reaction to it.  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/02/2019   Lesion of right ulnar nerve 06/22/2019   Pain in right hand 06/22/2019   Vitamin B12 deficiency 07/28/2018   Gastroesophageal reflux disease without esophagitis 12/01/2017   Hypomagnesemia 12/01/2017   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     albuterol (PROVENTIL HFA;VENTOLIN HFA) 108 (90 Base) MCG/ACT inhaler Inhale into the lungs every 4 (four) hours as needed for wheezing.      benzonatate (TESSALON) 100 MG capsule Take 1 capsule (100 mg total) by mouth every 8 (eight) hours. 21 capsule 0   Blood  Glucose Monitoring Suppl (GLUCOCOM BLOOD GLUCOSE MONITOR) [place] 1 each by XX route as directed One touch verio reflect meter     Blood Glucose Monitoring Suppl (ONE TOUCH ULTRA 2) w/Device KIT      Blood Glucose Monitoring Suppl (ONETOUCH VERIO REFLECT) w/Device KIT USE AS DIRECTED THREE TIMES A DAY FOR CBG MONITORING     budesonide-formoterol (SYMBICORT) 160-4.5 MCG/ACT inhaler Inhale 2 puffs into the lungs 2 (two) times daily as needed (shortness of breath).     cetirizine (ZYRTEC) 10 MG tablet Take 10 mg by mouth 2 (two) times daily.     cetirizine (ZYRTEC) 10 MG tablet Take by mouth.     cyanocobalamin (,VITAMIN B-12,) 1000 MCG/ML injection Inject 1,000 mcg into the muscle every 14 (fourteen) days.      cyanocobalamin (,VITAMIN B-12,) 1000 MCG/ML injection Inject into the muscle.     EPINEPHrine (EPIPEN 2-PAK) 0.3 mg/0.3 mL IJ SOAJ injection Inject 0.3 mLs (0.3 mg total) into the muscle once as needed for up to 1 dose (for severe allergic reaction). CAll 911 immediately if you have to use this medicine 1 each 1   famotidine (PEPCID) 20 MG tablet Take 1 tablet (20 mg total) by mouth 2 (two) times daily. 10 tablet 0   famotidine (PEPCID) 20 MG tablet Take by mouth.     fluconazole (DIFLUCAN) 150 MG tablet Take by mouth.     folic acid (FOLVITE) 1 MG tablet Take 1 mg by mouth daily.     MAGNESIUM MALATE PO Take 202.5 mg  by mouth daily.     MAGNESIUM MALATE PO Take by mouth.     methylphenidate 54 MG PO CR tablet Take 1 tablet (54 mg total) by mouth every morning. 90 tablet 0   methylphenidate 54 MG PO CR tablet Take by mouth.     methylphenidate 54 MG PO CR tablet Take by mouth.     methylPREDNISolone (MEDROL DOSEPAK) 4 MG TBPK tablet See admin instructions.     ondansetron (ZOFRAN) 4 MG tablet Take 1 tablet (4 mg total) by mouth every 8 (eight) hours as needed. 30 tablet 0   ONETOUCH VERIO test strip 1 each 3 (three) times daily.     OZEMPIC, 0.25 OR 0.5 MG/DOSE, 2 MG/1.5ML SOPN      PAXLOVID,  150/100, 10 x 150 MG & 10 x 100MG TBPK See admin instructions.     polyethylene glycol powder (GLYCOLAX/MIRALAX) 17 GM/SCOOP powder Take by mouth.     predniSONE (DELTASONE) 10 MG tablet Take 10 mg by mouth daily.     Prenatal 27-1 MG TABS      Prenatal Vit-Fe Fumarate-FA (PRENATAL MULTIVITAMIN) TABS tablet Take 1 tablet by mouth daily at 12 noon.     Quercetin Dihydrate POWD Take by mouth.     QUERCETIN PO Take 200 mg by mouth daily. 4 tabs daily (1054m)     terbinafine (LAMISIL) 250 MG tablet Take 1 tablet (250 mg total) by mouth daily. 90 tablet 0   Vitamin D, Ergocalciferol, (DRISDOL) 1.25 MG (50000 UT) CAPS capsule Take 50,000 Units by mouth every 7 (seven) days.     No current facility-administered medications on file prior to visit.    Allergies  Allergen Reactions   Cinnamon Anaphylaxis   Crab Extract Allergy Skin Test Anaphylaxis   Eggs Or Egg-Derived Products Anaphylaxis   Influenza Virus Vaccine Anaphylaxis   Levaquin [Levofloxacin] Anaphylaxis   Pineapple Rash    Swelling, rash, GI upset Swelling, rash, GI upset Swelling, rash, GI upset   Polysorbate Anaphylaxis   Pork-Derived Products Anaphylaxis   Sulfa Antibiotics Hives, Nausea Only and Rash   Amoxicillin-Pot Clavulanate Other (See Comments)    [Derm] [GI]   Sulfamethoxazole Nausea And Vomiting and Rash    Objective: Physical Exam  General: Well developed, nourished, no acute distress, awake, alert and oriented x 3  Vascular: Dorsalis pedis artery 2/4 bilateral, Posterior tibial artery 2/4 bilateral, skin temperature warm to warm proximal to distal bilateral lower extremities, no varicosities, pedal hair present bilateral.   Neurological: Gross sensation present via light touch bilateral.   Dermatological: Skin is warm, dry, and supple bilateral, Bilateral hallux and right 2nd toenails are tender, short thick, and discolored with mild subungal debris, no webspace macerations present bilateral, no open lesions  present bilateral, no callus/corns/hyperkeratotic tissue present bilateral. No signs of infection bilateral.   Musculoskeletal: No boney deformities noted bilateral. Muscular strength within normal limits without painon range of motion. No pain with calf compression bilateral.  Fungal culture + Fungus and microtrauma as previously noted  Assessment and Plan:  Problem List Items Addressed This Visit   None Visit Diagnoses     Long-term use of high-risk medication    -  Primary   Relevant Orders   Hepatic Function Panel   Nail fungus       Relevant Medications   PAXLOVID, 150/100, 10 x 150 MG & 10 x 100MG TBPK   Toe pain, bilateral           -Examined patient -Re-Discussed  treatment options for painful mycotic nails -Advised patient to resume the Lamisil that she has we plan to allow her to finish the last 4 to 6 weeks of the medication and then get blood test if her blood test is normal likely patient will need to resume another course for 90 days since there has been a lapse in her treatment -Patient to return in 4-6 weeks for follow up evaluation or sooner if symptoms worsen.  Landis Martins, DPM

## 2021-07-29 ENCOUNTER — Telehealth: Payer: Self-pay

## 2021-07-29 NOTE — Telephone Encounter (Signed)
Biotronik alert received- 20 HVR episodes in last 24 hours.  No details provided for episodes.    Attempted to reach pt to assess symptoms and med compliance.  No answer.  Left VM with device clinic # and hours.

## 2021-08-02 NOTE — Telephone Encounter (Signed)
Successful telephone encounter to patient to follow up on 20 HVR episodes noted 07/29/21. Patient admits to not feeling well that day but couldn't point out a specific symptoms. Does take quercetin for ADD which raises her HR however she takes this every day and feels medication is not related to episodes. She will continue to self monitor symptoms. She is encouraged to contact device clinic at [phone] with any additional questions or concerns. Erin Casey is appreciative of call. Will route to Dr. Caryl Comes for review.

## 2021-08-15 ENCOUNTER — Ambulatory Visit (INDEPENDENT_AMBULATORY_CARE_PROVIDER_SITE_OTHER): Payer: 59

## 2021-08-15 DIAGNOSIS — R55 Syncope and collapse: Secondary | ICD-10-CM | POA: Diagnosis not present

## 2021-08-15 LAB — CUP PACEART REMOTE DEVICE CHECK
Date Time Interrogation Session: 20221117083029
Implantable Lead Implant Date: 20130708
Implantable Lead Implant Date: 20130708
Implantable Lead Location: 753859
Implantable Lead Location: 753860
Implantable Lead Model: 350
Implantable Lead Model: 350
Implantable Lead Serial Number: 29222139
Implantable Lead Serial Number: 29231638
Implantable Pulse Generator Implant Date: 20130708
Pulse Gen Serial Number: 66303845

## 2021-08-26 NOTE — Progress Notes (Signed)
Remote pacemaker transmission.   

## 2021-09-24 ENCOUNTER — Encounter: Payer: Self-pay | Admitting: Internal Medicine

## 2021-09-24 NOTE — Telephone Encounter (Signed)
Phone call made to pt  and she asks that RN speak with her friend, Collie Siad to relay information as she is getting ready to take a test.  Advised pt will need to take hard copy of the prescription to [facility] to be filled as the pharmacist will need this to add Dr Olin Pia DEA number in order to fill.  Collie Siad verbalizes understanding and states she will give information to the pt.

## 2021-10-03 ENCOUNTER — Ambulatory Visit: Payer: 59 | Admitting: Sports Medicine

## 2021-10-07 ENCOUNTER — Emergency Department (HOSPITAL_BASED_OUTPATIENT_CLINIC_OR_DEPARTMENT_OTHER): Payer: 59

## 2021-10-07 ENCOUNTER — Other Ambulatory Visit: Payer: Self-pay

## 2021-10-07 ENCOUNTER — Inpatient Hospital Stay (HOSPITAL_BASED_OUTPATIENT_CLINIC_OR_DEPARTMENT_OTHER)
Admission: EM | Admit: 2021-10-07 | Discharge: 2021-10-11 | DRG: 916 | Disposition: A | Discharge status: Home / Self Care | Payer: 59 | Attending: Internal Medicine | Admitting: Internal Medicine

## 2021-10-07 ENCOUNTER — Other Ambulatory Visit (HOSPITAL_BASED_OUTPATIENT_CLINIC_OR_DEPARTMENT_OTHER): Payer: Self-pay

## 2021-10-07 ENCOUNTER — Encounter (HOSPITAL_BASED_OUTPATIENT_CLINIC_OR_DEPARTMENT_OTHER): Payer: Self-pay

## 2021-10-07 DIAGNOSIS — E538 Deficiency of other specified B group vitamins: Secondary | ICD-10-CM | POA: Diagnosis present

## 2021-10-07 DIAGNOSIS — Z8249 Family history of ischemic heart disease and other diseases of the circulatory system: Secondary | ICD-10-CM | POA: Diagnosis not present

## 2021-10-07 DIAGNOSIS — Z90710 Acquired absence of both cervix and uterus: Secondary | ICD-10-CM | POA: Diagnosis not present

## 2021-10-07 DIAGNOSIS — T782XXA Anaphylactic shock, unspecified, initial encounter: Principal | ICD-10-CM | POA: Diagnosis present

## 2021-10-07 DIAGNOSIS — Z91014 Allergy to mammalian meats: Secondary | ICD-10-CM | POA: Diagnosis not present

## 2021-10-07 DIAGNOSIS — T380X5A Adverse effect of glucocorticoids and synthetic analogues, initial encounter: Secondary | ICD-10-CM | POA: Diagnosis not present

## 2021-10-07 DIAGNOSIS — Z809 Family history of malignant neoplasm, unspecified: Secondary | ICD-10-CM

## 2021-10-07 DIAGNOSIS — L508 Other urticaria: Secondary | ICD-10-CM

## 2021-10-07 DIAGNOSIS — Z833 Family history of diabetes mellitus: Secondary | ICD-10-CM | POA: Diagnosis not present

## 2021-10-07 DIAGNOSIS — Z20822 Contact with and (suspected) exposure to covid-19: Secondary | ICD-10-CM | POA: Diagnosis not present

## 2021-10-07 DIAGNOSIS — E876 Hypokalemia: Secondary | ICD-10-CM | POA: Diagnosis present

## 2021-10-07 DIAGNOSIS — Z79899 Other long term (current) drug therapy: Secondary | ICD-10-CM

## 2021-10-07 DIAGNOSIS — D8940 Mast cell activation, unspecified: Secondary | ICD-10-CM | POA: Diagnosis not present

## 2021-10-07 DIAGNOSIS — Z950 Presence of cardiac pacemaker: Secondary | ICD-10-CM

## 2021-10-07 DIAGNOSIS — R55 Syncope and collapse: Secondary | ICD-10-CM | POA: Diagnosis not present

## 2021-10-07 DIAGNOSIS — T783XXA Angioneurotic edema, initial encounter: Secondary | ICD-10-CM

## 2021-10-07 DIAGNOSIS — K219 Gastro-esophageal reflux disease without esophagitis: Secondary | ICD-10-CM | POA: Diagnosis present

## 2021-10-07 DIAGNOSIS — I10 Essential (primary) hypertension: Secondary | ICD-10-CM | POA: Diagnosis present

## 2021-10-07 LAB — CBC WITH DIFFERENTIAL/PLATELET
Abs Immature Granulocytes: 0.03 10*3/uL (ref 0.00–0.07)
Basophils Absolute: 0 10*3/uL (ref 0.0–0.1)
Basophils Relative: 0 %
Eosinophils Absolute: 0.1 10*3/uL (ref 0.0–0.5)
Eosinophils Relative: 1 %
HCT: 45.1 % (ref 36.0–46.0)
Hemoglobin: 15.3 g/dL — ABNORMAL HIGH (ref 12.0–15.0)
Immature Granulocytes: 0 %
Lymphocytes Relative: 33 %
Lymphs Abs: 3.8 10*3/uL (ref 0.7–4.0)
MCH: 31.6 pg (ref 26.0–34.0)
MCHC: 33.9 g/dL (ref 30.0–36.0)
MCV: 93.2 fL (ref 80.0–100.0)
Monocytes Absolute: 1 10*3/uL (ref 0.1–1.0)
Monocytes Relative: 9 %
Neutro Abs: 6.4 10*3/uL (ref 1.7–7.7)
Neutrophils Relative %: 57 %
Platelets: 214 10*3/uL (ref 150–400)
RBC: 4.84 MIL/uL (ref 3.87–5.11)
RDW: 12.2 % (ref 11.5–15.5)
WBC: 11.3 10*3/uL — ABNORMAL HIGH (ref 4.0–10.5)
nRBC: 0 % (ref 0.0–0.2)

## 2021-10-07 LAB — HCG, SERUM, QUALITATIVE: Preg, Serum: NEGATIVE

## 2021-10-07 LAB — CBG MONITORING, ED: Glucose-Capillary: 231 mg/dL — ABNORMAL HIGH (ref 70–99)

## 2021-10-07 LAB — BASIC METABOLIC PANEL WITH GFR
Anion gap: 6 (ref 5–15)
BUN: 14 mg/dL (ref 6–20)
CO2: 23 mmol/L (ref 22–32)
Calcium: 6.6 mg/dL — ABNORMAL LOW (ref 8.9–10.3)
Chloride: 110 mmol/L (ref 98–111)
Creatinine, Ser: 0.64 mg/dL (ref 0.44–1.00)
GFR, Estimated: >60 mL/min (ref >60)
Glucose, Bld: 113 mg/dL — ABNORMAL HIGH (ref 70–99)
Potassium: 2.6 mmol/L — CL (ref 3.5–5.1)
Sodium: 139 mmol/L (ref 135–145)

## 2021-10-07 LAB — RESP PANEL BY RT-PCR (FLU A&B, COVID) ARPGX2
Influenza A by PCR: NEGATIVE
Influenza B by PCR: NEGATIVE
SARS Coronavirus 2 by RT PCR: NEGATIVE

## 2021-10-07 LAB — MAGNESIUM: Magnesium: 1.9 mg/dL (ref 1.7–2.4)

## 2021-10-07 LAB — POTASSIUM: Potassium: 4 mmol/L (ref 3.5–5.1)

## 2021-10-07 MED ORDER — PREDNISONE 10 MG PO TABS
ORAL_TABLET | Freq: Every day | ORAL | 0 refills | Status: DC
  Filled 2021-10-07: qty 42, 12d supply, fill #0

## 2021-10-07 MED ORDER — METHYLPREDNISOLONE SODIUM SUCC 125 MG IJ SOLR
125.0000 mg | Freq: Once | INTRAMUSCULAR | Status: AC
  Administered 2021-10-07: 125 mg via INTRAVENOUS
  Filled 2021-10-07: qty 2

## 2021-10-07 MED ORDER — FENTANYL CITRATE PF 50 MCG/ML IJ SOSY
25.0000 ug | PREFILLED_SYRINGE | Freq: Once | INTRAMUSCULAR | Status: AC
  Administered 2021-10-07: 25 ug via INTRAVENOUS
  Filled 2021-10-07: qty 1

## 2021-10-07 MED ORDER — FAMOTIDINE IN NACL 20-0.9 MG/50ML-% IV SOLN
20.0000 mg | Freq: Once | INTRAVENOUS | Status: AC
Start: 2021-10-07 — End: 2021-10-07
  Administered 2021-10-07: 20 mg via INTRAVENOUS
  Filled 2021-10-07: qty 50

## 2021-10-07 MED ORDER — POTASSIUM CHLORIDE CRYS ER 20 MEQ PO TBCR
20.0000 meq | EXTENDED_RELEASE_TABLET | Freq: Two times a day (BID) | ORAL | 0 refills | Status: DC
  Filled 2021-10-07: qty 4, 2d supply, fill #0

## 2021-10-07 MED ORDER — ONDANSETRON HCL 4 MG/2ML IJ SOLN
4.0000 mg | Freq: Once | INTRAMUSCULAR | Status: AC
  Administered 2021-10-07: 4 mg via INTRAVENOUS
  Filled 2021-10-07: qty 2

## 2021-10-07 MED ORDER — EPINEPHRINE 0.3 MG/0.3ML IJ SOAJ
0.3000 mg | INTRAMUSCULAR | 0 refills | Status: AC | PRN
  Filled 2021-10-07: qty 2, 1d supply, fill #0

## 2021-10-07 MED ORDER — AEROCHAMBER PLUS FLO-VU MEDIUM MISC
1.0000 | Freq: Once | Status: AC
  Administered 2021-10-07: 1
  Filled 2021-10-07: qty 1

## 2021-10-07 MED ORDER — LACTATED RINGERS IV BOLUS
1000.0000 mL | Freq: Once | INTRAVENOUS | Status: AC
Start: 2021-10-07 — End: 2021-10-07
  Administered 2021-10-07: 1000 mL via INTRAVENOUS

## 2021-10-07 MED ORDER — POTASSIUM CHLORIDE 10 MEQ/100ML IV SOLN
10.0000 meq | INTRAVENOUS | Status: AC
  Administered 2021-10-07 (×2): 10 meq via INTRAVENOUS
  Filled 2021-10-07 (×2): qty 100

## 2021-10-07 MED ORDER — ALBUTEROL SULFATE HFA 108 (90 BASE) MCG/ACT IN AERS
2.0000 | INHALATION_SPRAY | Freq: Once | RESPIRATORY_TRACT | Status: AC
  Administered 2021-10-07: 2 via RESPIRATORY_TRACT

## 2021-10-07 MED ORDER — DIPHENHYDRAMINE HCL 50 MG/ML IJ SOLN
25.0000 mg | Freq: Once | INTRAMUSCULAR | Status: AC
  Administered 2021-10-07: 25 mg via INTRAVENOUS
  Filled 2021-10-07: qty 1

## 2021-10-07 MED ORDER — POTASSIUM CHLORIDE CRYS ER 20 MEQ PO TBCR
20.0000 meq | EXTENDED_RELEASE_TABLET | Freq: Two times a day (BID) | ORAL | 0 refills | Status: DC
  Filled 2021-10-07: qty 6, 3d supply, fill #0

## 2021-10-07 MED ORDER — EPINEPHRINE 0.3 MG/0.3ML IJ SOAJ
0.3000 mg | Freq: Once | INTRAMUSCULAR | Status: AC
  Administered 2021-10-07: 0.3 mg via INTRAMUSCULAR
  Filled 2021-10-07: qty 0.3

## 2021-10-07 MED ORDER — ALBUTEROL SULFATE HFA 108 (90 BASE) MCG/ACT IN AERS
2.0000 | INHALATION_SPRAY | Freq: Once | RESPIRATORY_TRACT | Status: AC
  Administered 2021-10-07: 2 via RESPIRATORY_TRACT
  Filled 2021-10-07: qty 6.7

## 2021-10-07 MED ORDER — CHLORHEXIDINE GLUCONATE CLOTH 2 % EX PADS
6.0000 | MEDICATED_PAD | Freq: Every day | CUTANEOUS | Status: DC
  Administered 2021-10-07 – 2021-10-10 (×4): 6 via TOPICAL

## 2021-10-07 NOTE — ED Notes (Signed)
X-ray at bedside

## 2021-10-07 NOTE — ED Notes (Signed)
Pt tolerating PO intake at this time, provided with sprite and saltines per her request.  EDP Rebekah aware and agreeable.

## 2021-10-07 NOTE — Progress Notes (Signed)
Patient just arrived from [facility] via [facility]. Admitting paged.

## 2021-10-07 NOTE — Progress Notes (Signed)
  [facility] will assume care on arrival to accepting facility. Until arrival, care as per EDP. However, [facility] available 24/7 for questions and assistance.   Nursing staff please page [facility] Admits and Consults ([phone]) as soon as the patient arrives to the hospital.  Tylar Merendino, DO [facility]  

## 2021-10-07 NOTE — ED Triage Notes (Signed)
Pt BIB [facility] for allergic reaction. 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 States took last steroid yesterday, feels her body started reacting again today. States feels some chest/throat tightness, nausea & lightheadedness.   Took epi pen, 50mg  benadryl, atarax & 5 albuterol pta. Given 4mg  zofran & 518ml NS with EMS.

## 2021-10-07 NOTE — ED Provider Notes (Signed)
[place] [facility] Provider Note   CSN: [id number] Arrival date & time: 10/07/21  1104     History  Chief Complaint  Patient presents with   Allergic Reaction    Erin Casey is a 41 y.o. female with history of autoimmune urticaria with mast cell activation as well as neurocardiogenic syncope who presents with concern for allergic reaction at home.  States that she felt that she was beginning to have a reaction on 1/3 and her PCP put her on a 5-day steroid burst.  She states that yesterday she began to feel like her throat was sore but she took her last dose of prednisone.  This morning she began to become nauseous, felt she was going to pass out, felt very hot, felt like her throat was very tight, painful, and was having difficulty swallowing.  Additionally felt like her chest was tightness short of breath.  She administered an EpiPen, 50 mg of Benadryl, 1 tablet of Atarax, and 5 mg inhaled albuterol at home and called EMS.  EMS gave her 4 mg of Zofran IV, 500 mL NS, and brought her to the emergency department.  Per patient, EpiPen administered at 9:52 AM.  Level 5 caveat due to acuity of patient's presentation upon arrival.  Patient states that she is scheduled to receive monthly injections at her PCP for her newly diagnosed autoimmune disorder, however she is awaiting for her PCP to receive shipment of this medication.  I personally reviewed this patient's medical records patient is history of hypomagnesemia, hypokalemia, and pacemaker (band Biotronik) for her history of neurocardiogenic syncope.  Patient is a [employer]ar[employer]; she lives at home with her 45 year old daughter who is currently sick.  HPI     Home Medications Prior to Admission medications   Medication Sig Start Date End Date Taking? Authorizing Provider  EPINEPHrine 0.3 mg/0.3 mL IJ SOAJ injection Inject 0.3 mg into the muscle as needed for anaphylaxis. 10/07/21  Yes Manish Ruggiero, Eugene Garnet R, PA-C   predniSONE (DELTASONE) 10 MG tablet Take 6 tablets by mouth daily for 2 days, then 5 tablets for 2 days, then 4 tablets for 2 days, then 3 tablets for 2 days, then 2 tablets for 2 days, then 1 tablet for 2 days. 10/07/21  Yes Aldine Grainger, Gypsy Balsam, PA-C  acyclovir (ZOVIRAX) 400 MG tablet Take 400 mg by mouth 3 (three) times daily. 03/05/21   [provider]  albuterol (PROVENTIL HFA;VENTOLIN HFA) 108 (90 Base) MCG/ACT inhaler Inhale into the lungs every 4 (four) hours as needed for wheezing.     [provider]  benzonatate (TESSALON) 100 MG capsule Take 1 capsule (100 mg total) by mouth every 8 (eight) hours. 05/23/21   Eustaquio Maize, PA-C  Blood Glucose Monitoring Suppl (GLUCOCOM BLOOD GLUCOSE MONITOR) DEVI 1 each by XX route as directed One touch verio reflect meter    [provider]  Blood Glucose Monitoring Suppl (ONE TOUCH ULTRA 2) w/Device KIT  11/21/18   [provider]  Blood Glucose Monitoring Suppl (ONETOUCH VERIO REFLECT) w/Device KIT USE AS DIRECTED THREE TIMES A DAY FOR CBG MONITORING 05/14/20   [provider]  budesonide-formoterol (SYMBICORT) 160-4.5 MCG/ACT inhaler Inhale 2 puffs into the lungs 2 (two) times daily as needed (shortness of breath).    [provider]  cetirizine (ZYRTEC) 10 MG tablet Take 10 mg by mouth 2 (two) times daily.    [provider]  cetirizine (ZYRTEC) 10 MG tablet Take by mouth.  [provider]  cyanocobalamin (,VITAMIN B-12,) 1000 MCG/ML injection Inject 1,000 mcg into the muscle every 14 (fourteen) days.     [provider]  cyanocobalamin (,VITAMIN B-12,) 1000 MCG/ML injection Inject into the muscle.    [provider]  EPINEPHrine (EPIPEN 2-PAK) 0.3 mg/0.3 mL IJ SOAJ injection Inject 0.3 mLs (0.3 mg total) into the muscle once as needed for up to 1 dose (for severe allergic reaction). CAll 911 immediately if you have to use this medicine 01/23/20   Margarita Mail,  PA-C  famotidine (PEPCID) 20 MG tablet Take 1 tablet (20 mg total) by mouth 2 (two) times daily. 01/23/20   Margarita Mail, PA-C  famotidine (PEPCID) 20 MG tablet Take by mouth.    [provider]  fluconazole (DIFLUCAN) 150 MG tablet Take by mouth. 03/11/21   [provider]  folic acid (FOLVITE) 1 MG tablet Take 1 mg by mouth daily. 01/15/21   [provider]  MAGNESIUM MALATE PO Take 202.5 mg by mouth daily.    [provider]  MAGNESIUM MALATE PO Take by mouth.    [provider]  methylphenidate 54 MG PO CR tablet Take 1 tablet (54 mg total) by mouth every morning. 01/11/19   Deboraha Sprang, MD  methylphenidate 54 MG PO CR tablet Take by mouth.    [provider]  methylphenidate 54 MG PO CR tablet Take by mouth. 04/11/21   [provider]  ondansetron (ZOFRAN) 4 MG tablet Take 1 tablet (4 mg total) by mouth every 8 (eight) hours as needed. 07/28/19   Verner Mould, MD  [facility] VERIO test strip 1 each 3 (three) times daily. 07/17/20   [provider]  OZEMPIC, 0.25 OR 0.5 MG/DOSE, 2 MG/1.5ML SOPN  11/05/20   [provider]  PAXLOVID, 150/100, 10 x 150 MG & 10 x 100MG TBPK See admin instructions. 05/15/21   [provider]  polyethylene glycol powder (GLYCOLAX/MIRALAX) 17 GM/SCOOP powder Take by mouth.    [provider]  potassium chloride SA (KLOR-CON M) 20 MEQ tablet Take 1 tablet (20 mEq total) by mouth 2 (two) times daily for 2 days. 10/07/21 10/09/21  Riya Huxford, Gypsy Balsam, PA-C  Prenatal 27-1 MG TABS  08/25/18   [provider]  Prenatal Vit-Fe Fumarate-FA (PRENATAL MULTIVITAMIN) TABS tablet Take 1 tablet by mouth daily at 12 noon.    [provider]  Quercetin Dihydrate POWD Take by mouth.    [provider]  QUERCETIN PO Take 200 mg by mouth daily. 4 tabs daily (1044m)    [provider]  terbinafine (LAMISIL) 250 MG tablet Take 1 tablet (250 mg  total) by mouth daily. 04/04/21   SLandis Martins DPM  Vitamin D, Ergocalciferol, (DRISDOL) 1.25 MG (50000 UT) CAPS capsule Take 50,000 Units by mouth every 7 (seven) days.    [provider]      Allergies    Cinnamon, Crab extract allergy skin test, Eggs or egg-derived products, Influenza virus vaccine, Levaquin [levofloxacin], Pineapple, Polysorbate, Pork-derived products, Sulfa antibiotics, Amoxicillin-pot clavulanate, and Sulfamethoxazole    Review of Systems   Review of Systems  Constitutional:  Positive for fatigue. Negative for activity change, appetite change, chills and fever.  HENT:  Positive for facial swelling, sore throat and trouble swallowing.   Eyes: Negative.   Respiratory:  Positive for cough, chest tightness and shortness of breath.   Cardiovascular: Negative.   Gastrointestinal:  Positive for nausea. Negative for abdominal distention, abdominal  pain, anal bleeding, blood in stool, constipation, diarrhea and vomiting.  Musculoskeletal: Negative.   Skin:  Positive for rash.       Urticarial rash on the face  Neurological:  Positive for headaches. Negative for syncope.       Near syncope   Physical Exam Updated Vital Signs BP (!) 117/57 (BP Location: Right Arm)    Pulse 91    Temp 97.9 F (36.6 C) (Oral)    Resp 17    Ht 5' 10" (1.778 m)    Wt 120.7 kg    SpO2 100%    BMI 38.17 kg/m  Physical Exam Vitals and nursing note reviewed.  Constitutional:      Appearance: She is obese. She is not ill-appearing or toxic-appearing.  HENT:     Head: Normocephalic and atraumatic.     Nose: Nose normal. No congestion.     Mouth/Throat:     Mouth: Mucous membranes are moist.     Pharynx: Oropharynx is clear. Uvula midline. Posterior oropharyngeal erythema present. No oropharyngeal exudate.     Tonsils: No tonsillar exudate.  Eyes:     General: Lids are normal. Vision grossly intact.        Right eye: No discharge.        Left eye: No discharge.     Extraocular  Movements: Extraocular movements intact.     Conjunctiva/sclera: Conjunctivae normal.     Pupils: Pupils are equal, round, and reactive to light.  Neck:     Trachea: Trachea normal. No tracheal tenderness.     Comments: Hoarseness of voice upon arrival Cardiovascular:     Rate and Rhythm: Normal rate and regular rhythm.     Pulses: Normal pulses.     Heart sounds: Normal heart sounds. No murmur heard. Pulmonary:     Effort: Pulmonary effort is normal. Tachypnea present. No bradypnea, accessory muscle usage, prolonged expiration or respiratory distress.     Breath sounds: Normal breath sounds. No decreased air movement or transmitted upper airway sounds. No wheezing or rales.  Chest:     Chest wall: No mass, lacerations, deformity, swelling, tenderness, crepitus or edema. There is no dullness to percussion.  Abdominal:     General: Bowel sounds are normal. There is no distension.     Palpations: Abdomen is soft.     Tenderness: There is no abdominal tenderness. There is no right CVA tenderness, left CVA tenderness, guarding or rebound.  Musculoskeletal:        General: No deformity.     Cervical back: Normal range of motion and neck supple. No edema, rigidity, tenderness or crepitus. No pain with movement, spinous process tenderness or muscular tenderness.     Right lower leg: No edema.     Left lower leg: No edema.  Lymphadenopathy:     Cervical: No cervical adenopathy.  Skin:    General: Skin is warm and dry.     Capillary Refill: Capillary refill takes less than 2 seconds.     Findings: No rash.  Neurological:     General: No focal deficit present.     Mental Status: She is alert and oriented to person, place, and time. Mental status is at baseline.     GCS: GCS eye subscore is 4. GCS verbal subscore is 5. GCS motor subscore is 6.     Gait: Gait is intact.  Psychiatric:        Mood and Affect: Mood normal.    ED Results /  Procedures / Treatments   Labs (all labs ordered are  listed, but only abnormal results are displayed) Labs Reviewed  CBC WITH DIFFERENTIAL/PLATELET - Abnormal; Notable for the following components:      Result Value   WBC 11.3 (*)    Hemoglobin 15.3 (*)    All other components within normal limits  BASIC METABOLIC PANEL - Abnormal; Notable for the following components:   Potassium 2.6 (*)    Glucose, Bld 113 (*)    Calcium 6.6 (*)    All other components within normal limits  CBG MONITORING, ED - Abnormal; Notable for the following components:   Glucose-Capillary 231 (*)    All other components within normal limits  RESP PANEL BY RT-PCR (FLU A&B, COVID) ARPGX2  HCG, SERUM, QUALITATIVE  POTASSIUM  MAGNESIUM    EKG EKG Interpretation  Date/Time:  Monday October 07 2021 11:15:11 EST Ventricular Rate:  88 PR Interval:  142 QRS Duration: 100 QT Interval:  358 QTC Calculation: 434 R Axis:   28 Text Interpretation: Sinus rhythm Low voltage, precordial leads Borderline T abnormalities, anterior leads since last tracing no significant change Confirmed by Toya, Palacios [phone]) on 10/07/2021 12:33:24 PM  Radiology DG Chest Port 1 View  Result Date: 10/07/2021 CLINICAL DATA:  Anaphylactic allergic reaction EXAM: PORTABLE CHEST 1 VIEW COMPARISON:  05/23/2021 chest radiograph. FINDINGS: Stable configuration of 2 lead left subclavian pacemaker. Stable cardiomediastinal silhouette with normal heart size. No pneumothorax. No pleural effusion. Lungs appear clear, with no acute consolidative airspace disease and no pulmonary edema. IMPRESSION: No active disease. Electronically Signed   By: Ilona Sorrel M.D.   On: 10/07/2021 12:19    Procedures .Critical Care Performed by: Emeline Darling, PA-C Authorized by: Emeline Darling, PA-C   Critical care provider statement:    Critical care time (minutes):  45   Critical care was necessary to treat or prevent imminent or life-threatening deterioration of the following conditions:  Anaphylaxis.   Critical care was time spent personally by me on the following activities:  Development of treatment plan with patient or surrogate, discussions with consultants, evaluation of patient's response to treatment, examination of patient, obtaining history from patient or surrogate, ordering and performing treatments and interventions, ordering and review of laboratory studies, ordering and review of radiographic studies, pulse oximetry and re-evaluation of patient's condition    Medications Ordered in ED Medications  methylPREDNISolone sodium succinate (SOLU-MEDROL) 125 mg/2 mL injection 125 mg (125 mg Intravenous Given 10/07/21 1151)  ondansetron (ZOFRAN) injection 4 mg (4 mg Intravenous Given 10/07/21 1221)  fentaNYL (SUBLIMAZE) injection 25 mcg (25 mcg Intravenous Given 10/07/21 1222)  lactated ringers bolus 1,000 mL (0 mLs Intravenous Stopped 10/07/21 1324)  potassium chloride 10 mEq in 100 mL IVPB (0 mEq Intravenous Stopped 10/07/21 1511)  albuterol (VENTOLIN HFA) 108 (90 Base) MCG/ACT inhaler 2 puff (2 puffs Inhalation Given 10/07/21 1516)  AeroChamber Plus Flo-Vu Medium MISC 1 each (1 each Other Given 10/07/21 1516)  famotidine (PEPCID) IVPB 20 mg premix (0 mg Intravenous Stopped 10/07/21 1719)  EPINEPHrine (EPI-PEN) injection 0.3 mg (0.3 mg Intramuscular Given 10/07/21 1731)  diphenhydrAMINE (BENADRYL) injection 25 mg (25 mg Intravenous Given 10/07/21 1758)  ondansetron (ZOFRAN) injection 4 mg (4 mg Intravenous Given 10/07/21 1757)  albuterol (VENTOLIN HFA) 108 (90 Base) MCG/ACT inhaler 2 puff (2 puffs Inhalation Given 10/07/21 1939)    ED Course/ Medical Decision Making/ A&P Clinical Course as of 10/07/21 1953  Mon Oct 07, 2021  1213 EPI pen at  10 am. [RS]  1543 Preg, Serum: NEGATIVE [RS]  0768 EpiPen administered by RN. [RS]  0881 Serially reevaluation of the patient revealed hemodynamic stability, improving throat tightness. Stable chest tightness, nausea. Patient without increased WOB.  [RS]   1923 Consult to Dr. Bridgett Larsson, hospitalist, who plans to admit this patient to stepdown unit at [facility].  I appreciate his collaboration in the care of this patient. [RS]    Clinical Course User Index [RS] Sponseller, Gypsy Balsam, PA-C                           Medical Decision Making This patient presents to the ED for concern of allergic, this involves an extensive number of treatment options, and is a complaint that carries with it a high risk of complications and morbidity.  The differential diagnosis includes but is not limited to, localized allergic reaction, anaphylaxis, angioedema, asthma attack, anxiety attack.  Patient presenting to the emergency department with HPI consistent with anaphylactic reaction with sudden onset skin itching, facial swelling, pain in the throat, near syncope, and severe nausea.  History of autoimmune mediated anaphylaxis secondary to autoimmune urticaria with mast cell activation.  Vital signs are reassuring on intake.  Patient is intermittently tachypneic but satting greater than 90% on room air at all times.  Patient was monitored for the required 4-hour period following arrival after administration of EpiPen.  She already dosed herself with antihistamine medications at home therefore the only thing that was initially given in the emergency department was 125 mg of Solu-Medrol IV.  Patient was found to be improving throughout her stay in the emergency department and plan was to discharge her home with prednisone taper and EpiPen.  Unfortunately at the time that the patient was supposed to be discharged I presented to the bedside to reevaluate her and found the patient to be endorsing new discomfort in her nose and sensation of tightness in her face.  She was reevaluated tenderness later found to have worsening pain in her throat, feeling it was very dry and difficult for her to swallow as well as feeling tightness in her chest.  She endorses new onset of  nausea in the last few minutes and decision was made to administer epinephrine IM.  Patient reevaluated and remains hemodynamically stable with improving anaphylactic symptoms. ED staff to continue to monitor until transfer to step-down unit.   Comorbidities that complicate the patient evaluation: Neurocardiogenic syncope Hypomagnesemia Hypertension Vitamin B12 deficiency GERD   Additional history obtained:  Additional history obtained from chart review External records from outside source obtained and reviewed including none   Lab Tests:  I Ordered, reviewed, and interpreted labs.  The pertinent results include: CBC with mild cytosis of 11,000, BMP with hypokalemia to 2.6.  Possible contribution by patient's 5 mg of albuterol prior to arrival to the emergency department.  Will recheck after IV repletion. Patient administered 2 rounds of IV potassium repletion.  Repeat potassium level 4 and magnesium level 1.9. Do suspect transient hypokalemia contributed to by albuterol prior to arrival.   Imaging Studies ordered:  I ordered imaging studies including chest xray I independently visualized and interpreted imaging which showed no acute cardiopulmonary disease I agree with the radiologist interpretation   Cardiac Monitoring:  The patient was maintained on a cardiac monitor.  I personally viewed and interpreted the cardiac monitored which showed an underlying rhythm of: NSR   Medicines ordered and prescription drug management:  I ordered medication including EpiPen Solu-Medrol, Pepcid, Benadryl for anaphylactic reaction.  Zofran also administered for nausea during anaphylaxis, fentanyl for subsequent pain.  LR bolus for weakness, albuterol for chest tightness, and IV potassium repletion for hypokalemia. Reevaluation of the patient after these medicines showed that the patient improved I have reviewed the patients home medicines and have made adjustments as needed  Tests  Considered: NO additional testing.    Critical Interventions:  Epipen for anaphylaxis   Consultations Obtained:  I requested consultation with the hospitalist,  and discussed lab and imaging findings as well as pertinent plan - they recommend: admission to step-down unit at [facility] for observation.   Problem List / ED Course: Anaphylaxis: Steroids, antihistamines, and EpiPen. Nausea managed with Zofran Chest tightness managed with albuterol as well as above management for anaphylaxis  Reevaluation:  After the interventions noted above, I reevaluated the patient and found that they have :improved  Social Determinants of Health  Dispostion:  After consideration of the diagnostic results and the patients response to treatment feel that the patent would benefit from admission to the hospital for stabilization and further management of her anaphylaxis.  Will require overnight observation.  If she escalates in her symptoms she may require epinephrine drip and transferred to the ICU rather than stepdown unit.  Madicyn voiced understanding of her medical evaluation and treatment plan.  Each of her questions was answered to her expressed satisfaction.  She is amenable to plan for admission to the hospital at this time.   Attending physician made aware of this patient prior to my departure from the emergency department as she will continue to board in the stand-alone ED until bed is available in stepdown unit at [facility].   This chart was dictated using voice recognition software, Dragon. Despite the best efforts of this provider to proofread and correct errors, errors may still occur which can change documentation meaning.  Final Clinical Impression(s) / ED Diagnoses Final diagnoses:  Anaphylaxis, initial encounter    Rx / DC Orders ED Discharge Orders          Ordered    potassium chloride SA (KLOR-CON M) 20 MEQ tablet  2 times daily,   Status:  Discontinued         10/07/21 1625    predniSONE (DELTASONE) 10 MG tablet  Daily        10/07/21 1625    potassium chloride SA (KLOR-CON M) 20 MEQ tablet  2 times daily        10/07/21 1626    EPINEPHrine 0.3 mg/0.3 mL IJ SOAJ injection  As needed        10/07/21 1627              Sponseller, Gypsy Balsam, PA-C 10/07/21 1953    Malvin Johns, MD 10/08/21 1609

## 2021-10-07 NOTE — ED Notes (Signed)
RT at bedside to assess pt, as she states her breathing feels tight

## 2021-10-07 NOTE — ED Notes (Signed)
RT assessed patient due to allergic reaction. BBS clear and equal. Stated that she felt a little "tight" but is moving good air, No distress noted. RT to monitor as needed

## 2021-10-07 NOTE — ED Notes (Signed)
Pt c/o throat tightening that is progressing into her chest. Pt denies any itching to area, voice is clear, respirations unlabored.  EDP Rebekah made aware.

## 2021-10-07 NOTE — ED Notes (Signed)
Pt c/o feeling faint, RN to bedside.  VSS, cbg 231.  EDP Rebekah made aware.

## 2021-10-08 DIAGNOSIS — E538 Deficiency of other specified B group vitamins: Secondary | ICD-10-CM | POA: Diagnosis present

## 2021-10-08 DIAGNOSIS — T783XXA Angioneurotic edema, initial encounter: Secondary | ICD-10-CM

## 2021-10-08 DIAGNOSIS — Z91014 Allergy to mammalian meats: Secondary | ICD-10-CM | POA: Diagnosis not present

## 2021-10-08 DIAGNOSIS — E876 Hypokalemia: Secondary | ICD-10-CM | POA: Diagnosis present

## 2021-10-08 DIAGNOSIS — L508 Other urticaria: Secondary | ICD-10-CM | POA: Diagnosis not present

## 2021-10-08 DIAGNOSIS — I10 Essential (primary) hypertension: Secondary | ICD-10-CM | POA: Diagnosis present

## 2021-10-08 DIAGNOSIS — Z809 Family history of malignant neoplasm, unspecified: Secondary | ICD-10-CM | POA: Diagnosis not present

## 2021-10-08 DIAGNOSIS — Z8249 Family history of ischemic heart disease and other diseases of the circulatory system: Secondary | ICD-10-CM | POA: Diagnosis not present

## 2021-10-08 DIAGNOSIS — K219 Gastro-esophageal reflux disease without esophagitis: Secondary | ICD-10-CM | POA: Diagnosis present

## 2021-10-08 DIAGNOSIS — D8940 Mast cell activation, unspecified: Secondary | ICD-10-CM

## 2021-10-08 DIAGNOSIS — Z20822 Contact with and (suspected) exposure to covid-19: Secondary | ICD-10-CM | POA: Diagnosis present

## 2021-10-08 DIAGNOSIS — Z79899 Other long term (current) drug therapy: Secondary | ICD-10-CM | POA: Diagnosis not present

## 2021-10-08 DIAGNOSIS — Z90710 Acquired absence of both cervix and uterus: Secondary | ICD-10-CM | POA: Diagnosis not present

## 2021-10-08 DIAGNOSIS — T782XXA Anaphylactic shock, unspecified, initial encounter: Secondary | ICD-10-CM | POA: Diagnosis present

## 2021-10-08 DIAGNOSIS — R55 Syncope and collapse: Secondary | ICD-10-CM | POA: Diagnosis present

## 2021-10-08 DIAGNOSIS — T380X5A Adverse effect of glucocorticoids and synthetic analogues, initial encounter: Secondary | ICD-10-CM | POA: Diagnosis present

## 2021-10-08 DIAGNOSIS — Z833 Family history of diabetes mellitus: Secondary | ICD-10-CM | POA: Diagnosis not present

## 2021-10-08 DIAGNOSIS — Z950 Presence of cardiac pacemaker: Secondary | ICD-10-CM | POA: Diagnosis not present

## 2021-10-08 LAB — BASIC METABOLIC PANEL
Anion gap: 8 (ref 5–15)
BUN: 12 mg/dL (ref 6–20)
CO2: 24 mmol/L (ref 22–32)
Calcium: 8.3 mg/dL — ABNORMAL LOW (ref 8.9–10.3)
Chloride: 104 mmol/L (ref 98–111)
Creatinine, Ser: 0.54 mg/dL (ref 0.44–1.00)
GFR, Estimated: >60 mL/min (ref >60)
Glucose, Bld: 163 mg/dL — ABNORMAL HIGH (ref 70–99)
Potassium: 4.1 mmol/L (ref 3.5–5.1)
Sodium: 136 mmol/L (ref 135–145)

## 2021-10-08 LAB — MRSA NEXT GEN BY PCR, NASAL: MRSA by PCR Next Gen: NOT DETECTED

## 2021-10-08 LAB — CBC
HCT: 47.1 % — ABNORMAL HIGH (ref 36.0–46.0)
Hemoglobin: 15.8 g/dL — ABNORMAL HIGH (ref 12.0–15.0)
MCH: 31.5 pg (ref 26.0–34.0)
MCHC: 33.5 g/dL (ref 30.0–36.0)
MCV: 94 fL (ref 80.0–100.0)
Platelets: 216 10*3/uL (ref 150–400)
RBC: 5.01 MIL/uL (ref 3.87–5.11)
RDW: 12.4 % (ref 11.5–15.5)
WBC: 8.9 10*3/uL (ref 4.0–10.5)
nRBC: 0 % (ref 0.0–0.2)

## 2021-10-08 LAB — HIV ANTIBODY (ROUTINE TESTING W REFLEX): HIV Screen 4th Generation wRfx: NONREACTIVE

## 2021-10-08 LAB — ALBUMIN: Albumin: 3.6 g/dL (ref 3.5–5.0)

## 2021-10-08 MED ORDER — VITAMIN D (ERGOCALCIFEROL) 1.25 MG (50000 UNIT) PO CAPS
50000.0000 [IU] | ORAL_CAPSULE | ORAL | Status: DC
  Administered 2021-10-08: 50000 [IU] via ORAL
  Filled 2021-10-08: qty 1

## 2021-10-08 MED ORDER — SACCHAROMYCES BOULARDII 250 MG PO CAPS
250.0000 mg | ORAL_CAPSULE | Freq: Every day | ORAL | Status: DC
  Administered 2021-10-08 – 2021-10-11 (×4): 250 mg via ORAL
  Filled 2021-10-08 (×4): qty 1

## 2021-10-08 MED ORDER — DIPHENHYDRAMINE HCL 50 MG/ML IJ SOLN
25.0000 mg | Freq: Four times a day (QID) | INTRAMUSCULAR | Status: DC | PRN
  Administered 2021-10-08 – 2021-10-11 (×8): 25 mg via INTRAVENOUS
  Filled 2021-10-08 (×9): qty 1

## 2021-10-08 MED ORDER — ACETAMINOPHEN 325 MG PO TABS
650.0000 mg | ORAL_TABLET | Freq: Four times a day (QID) | ORAL | Status: DC | PRN
  Administered 2021-10-08 – 2021-10-11 (×4): 650 mg via ORAL
  Filled 2021-10-08 (×4): qty 2

## 2021-10-08 MED ORDER — ACETAMINOPHEN 650 MG RE SUPP: 650.0000 mg | Freq: Four times a day (QID) | RECTAL | Status: DC | PRN

## 2021-10-08 MED ORDER — CYANOCOBALAMIN 1000 MCG/ML IJ SOLN
1000.0000 ug | Freq: Once | INTRAMUSCULAR | Status: AC
  Administered 2021-10-08: 1000 ug via INTRAMUSCULAR
  Filled 2021-10-08: qty 1

## 2021-10-08 MED ORDER — LORATADINE 10 MG PO TABS: 10.0000 mg | ORAL_TABLET | Freq: Every day | ORAL | Status: DC

## 2021-10-08 MED ORDER — QUERCETIN 500 MG PO CAPS: 1000.0000 mg | ORAL_CAPSULE | Freq: Every day | ORAL | Status: DC

## 2021-10-08 MED ORDER — METHYLPREDNISOLONE SODIUM SUCC 125 MG IJ SOLR
80.0000 mg | Freq: Two times a day (BID) | INTRAMUSCULAR | Status: DC
  Administered 2021-10-08 – 2021-10-11 (×7): 80 mg via INTRAVENOUS
  Filled 2021-10-08 (×7): qty 2

## 2021-10-08 MED ORDER — ONDANSETRON HCL 4 MG/2ML IJ SOLN
4.0000 mg | Freq: Four times a day (QID) | INTRAMUSCULAR | Status: DC | PRN
  Administered 2021-10-10: 4 mg via INTRAVENOUS
  Filled 2021-10-08: qty 2

## 2021-10-08 MED ORDER — ALBUTEROL SULFATE (2.5 MG/3ML) 0.083% IN NEBU
2.5000 mg | INHALATION_SOLUTION | RESPIRATORY_TRACT | Status: DC | PRN
  Administered 2021-10-10 – 2021-10-11 (×2): 2.5 mg via RESPIRATORY_TRACT
  Filled 2021-10-08 (×2): qty 3

## 2021-10-08 MED ORDER — FAMOTIDINE IN NACL 20-0.9 MG/50ML-% IV SOLN
20.0000 mg | Freq: Two times a day (BID) | INTRAVENOUS | Status: DC
  Administered 2021-10-08 – 2021-10-11 (×7): 20 mg via INTRAVENOUS
  Filled 2021-10-08 (×7): qty 50

## 2021-10-08 MED ORDER — LORATADINE 10 MG PO TABS
10.0000 mg | ORAL_TABLET | Freq: Two times a day (BID) | ORAL | Status: DC
  Administered 2021-10-08 – 2021-10-10 (×6): 10 mg via ORAL
  Filled 2021-10-08 (×6): qty 1

## 2021-10-08 NOTE — Progress Notes (Signed)
Same day note  Patient seen and examined at bedside.  Patient was admitted to the hospital for allergic reaction  At the time of my evaluation, patient complains of feeling little better with the breathing but he still has sensation of incomplete breathing, rawness in the airways  Physical examination reveals obese female, not in obvious distress.  Lungs clear.  Laboratory data and imaging was reviewed  Assessment and Plan.  Angioedema with anaphylaxis in the setting of history of autoimmune urticaria/ mast cell activation syndrome Continue Pepcid IV, loratadine, IV steroids.  Patient was on 60 Milligrams of prednisone for 5 days as outpatient and soon after stopping it she had an exacerbation.  Currently no active wheezing.  Continue Solu-Medrol IV 80 twice daily albuterol as needed.  Patient might need prolonged steroid taper at the time of discharge.  Patient has plans to follow-up with her allergist Dr Hinton Rao in [place] who is currently discussing starting her on Xolair.   History of neurocardiogenic syncope Status post pacemaker placement.     Borderline leukocytosis Secondary to steroids.  Normalized this morning.   Hypokalemia Initial potassium was 2.6.  Potassium of 4.1 this morning.   Hypocalcemia Initial calcium low but currently 8.3.  Albumin of 3.6.  Continue vitamin D supplements.  Continue to observe for 24 hours.  If stable by tomorrow plan for disposition home.  No Charge  Signed,  Erin Pereyra, MD [facility]

## 2021-10-08 NOTE — H&P (Signed)
History and Physical    Erin Casey KJZ:[id number] DOB: 05/10/1981 DOA: 10/07/2021  PCP: [name], MD Patient coming from: [facility] ED  Chief Complaint: Allergic reaction  HPI: Erin Casey is a 41 y.o. female with medical history significant of autoimmune urticaria/mast cell activation syndrome, neurocardiogenic syncope status post PPM who presented to the ED with symptoms concerning for anaphylaxis including skin itching, facial swelling, throat pain, near syncope, and severe nausea.  She was recently placed on 5-day steroid burst by PCP due to concern for allergic reaction.  In the ED, EpiPen, IV Solu-Medrol 125 mg, Pepcid, and Benadryl was administered.  Patient improved initially but her symptoms returned after a few hours requiring administration of additional epinephrine.  Labs showing WBC 11.3.  Potassium 2.6 on initial labs and she was given KCl 10 mEq x 2.  Potassium 4.0 on repeat labs.  Magnesium within normal range.  Calcium 6.6.  COVID and flu negative.  Chest x-ray showing no active disease.  Patient reports history of anaphylaxis multiple times in the past and multiple prior hospitalizations for anaphylaxis.  States about 4 years ago she had to be on steroids for 3 months.  She has seen an allergist here in [place] and at [facility] in the past.  Currently being seen by an allergist in [place] and was diagnosed with "autoimmune urticaria and mast cell activation" last month in December.  Her allergist discussed Xolair but has not started this medication yet.  States she is allergic to air fresheners and perfumes.  6 days ago at work somebody used an Personnel officer and she an anaphylactic reaction which required administration of epinephrine and visit to the emergency room.  She was discharged with a 5-day course of prednisone 60 mg which she finished yesterday.  This morning around 9 AM she started having facial swelling, swelling of her throat, chest tightness, and difficulty  breathing.  No clear trigger at this time and she feels her symptoms are due to rebound from not having steroids.  She administered epinephrine at home and took antihistamines before coming into the emergency room.  In the ED, she initially improved after receiving medications and was stable for several hours and supposed to be discharged, however, had symptom rebound which required administration of additional medications.  States she feels better now with facial and throat swelling decreasing significantly.  Does not feel short of breath but has continued to have some chest tightness.  Review of Systems:  All systems reviewed and apart from history of presenting illness, are negative.  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   Vitamin B12 deficiency 07/28/2018    Past Surgical History:  Procedure Laterality Date   ABDOMINAL HYSTERECTOMY     CESAREAN SECTION     CHOLECYSTECTOMY     hysterectomy     LAPAROSCOPIC GASTRIC BANDING     With reversal    PACEMAKER INSERTION     PVC ABLATION N/A 03/12/2018   Procedure: PVC ABLATION;  Surgeon: Evans Lance, MD;  Location: [facility];  Service: Cardiovascular;  Laterality: N/A;   TONSILLECTOMY AND ADENOIDECTOMY     TUBAL LIGATION     ULNAR TUNNEL RELEASE Right 07/28/2019  Procedure: Right carpal tunnel release, right cubital tunnel release;  Surgeon: Verner Mould, MD;  Location: [facility];  Service: Orthopedics;  Laterality: Right;     reports that she has never smoked. She has never used smokeless tobacco. She reports current alcohol use. She reports that she does not use drugs.  Allergies  Allergen Reactions   Cinnamon Anaphylaxis   Crab  Extract Allergy Skin Test Anaphylaxis   Eggs Or Egg-Derived Products Anaphylaxis   Influenza Virus Vaccine Anaphylaxis   Levaquin [Levofloxacin] Anaphylaxis   Pineapple Rash    Swelling, rash, GI upset Swelling, rash, GI upset Swelling, rash, GI upset   Polysorbate Anaphylaxis   Pork-Derived Products Anaphylaxis   Sulfa Antibiotics Hives, Nausea Only and Rash   Amoxicillin-Pot Clavulanate Other (See Comments)    [Derm] [GI]   Sulfamethoxazole Nausea And Vomiting and Rash    Family History  Problem Relation Age of Onset   Diabetes Mother    Hyperlipidemia Mother    Hypertension Father    Heart disease Father    Hyperlipidemia Father    Mental illness Sister    Cancer Maternal Grandmother    Hyperlipidemia Maternal Grandmother    Heart disease Maternal Grandfather    Hyperlipidemia Maternal Grandfather    Hyperlipidemia Paternal Grandmother    Hypertension Paternal Grandmother    Heart disease Paternal Grandfather    Hyperlipidemia Paternal Grandfather     Prior to Admission medications   Medication Sig Start Date End Date Taking? Authorizing Provider  EPINEPHrine 0.3 mg/0.3 mL IJ SOAJ injection Inject 0.3 mg into the muscle as needed for anaphylaxis. 10/07/21  Yes Sponseller, Eugene Garnet R, PA-C  predniSONE (DELTASONE) 10 MG tablet Take 6 tablets by mouth daily for 2 days, then 5 tablets for 2 days, then 4 tablets for 2 days, then 3 tablets for 2 days, then 2 tablets for 2 days, then 1 tablet for 2 days. 10/07/21  Yes Sponseller, Gypsy Balsam, PA-C  acyclovir (ZOVIRAX) 400 MG tablet Take 400 mg by mouth 3 (three) times daily. 03/05/21   [provider]  albuterol (PROVENTIL HFA;VENTOLIN HFA) 108 (90 Base) MCG/ACT inhaler Inhale into the lungs every 4 (four) hours as needed for wheezing.     [provider]  benzonatate (TESSALON) 100 MG capsule Take 1 capsule (100 mg total) by mouth every 8 (eight) hours. 05/23/21   Eustaquio Maize, PA-C  Blood Glucose Monitoring Suppl  (GLUCOCOM BLOOD GLUCOSE MONITOR) DEVI 1 each by XX route as directed One touch verio reflect meter    [provider]  Blood Glucose Monitoring Suppl (ONE TOUCH ULTRA 2) w/Device KIT  11/21/18   [provider]  Blood Glucose Monitoring Suppl (ONETOUCH VERIO REFLECT) w/Device KIT USE AS DIRECTED THREE TIMES A DAY FOR CBG MONITORING 05/14/20   [provider]  budesonide-formoterol (SYMBICORT) 160-4.5 MCG/ACT inhaler Inhale 2 puffs into the lungs 2 (two) times daily as needed (shortness of breath).    [provider]  cetirizine (ZYRTEC) 10 MG tablet Take 10 mg by mouth 2 (two) times daily.    [provider]  cetirizine (ZYRTEC) 10 MG tablet Take by mouth.    [provider]  cyanocobalamin (,VITAMIN B-12,) 1000 MCG/ML injection Inject 1,000 mcg into the muscle every 14 (fourteen) days.     [provider]  cyanocobalamin (,VITAMIN B-12,) 1000 MCG/ML injection Inject into the muscle.    [provider]  EPINEPHrine (EPIPEN 2-PAK) 0.3 mg/0.3 mL IJ SOAJ injection Inject 0.3 mLs (  0.3 mg total) into the muscle once as needed for up to 1 dose (for severe allergic reaction). [facility] 911 immediately if you have to use this medicine 01/23/20   Margarita Mail, PA-C  famotidine (PEPCID) 20 MG tablet Take 1 tablet (20 mg total) by mouth 2 (two) times daily. 01/23/20   Margarita Mail, PA-C  famotidine (PEPCID) 20 MG tablet Take by mouth.    [provider]  fluconazole (DIFLUCAN) 150 MG tablet Take by mouth. 03/11/21   [provider]  folic acid (FOLVITE) 1 MG tablet Take 1 mg by mouth daily. 01/15/21   [provider]  MAGNESIUM MALATE PO Take 202.5 mg by mouth daily.    [provider]  MAGNESIUM MALATE PO Take by mouth.    [provider]  methylphenidate 54 MG PO CR tablet Take 1 tablet (54 mg total) by mouth every morning. 01/11/19   Deboraha Sprang, MD  methylphenidate 54 MG PO CR tablet Take by  mouth.    [provider]  methylphenidate 54 MG PO CR tablet Take by mouth. 04/11/21   [provider]  ondansetron (ZOFRAN) 4 MG tablet Take 1 tablet (4 mg total) by mouth every 8 (eight) hours as needed. 07/28/19   Verner Mould, MD  [facility] VERIO test strip 1 each 3 (three) times daily. 07/17/20   [provider]  OZEMPIC, 0.25 OR 0.5 MG/DOSE, 2 MG/1.5ML SOPN  11/05/20   [provider]  PAXLOVID, 150/100, 10 x 150 MG & 10 x 100MG TBPK See admin instructions. 05/15/21   [provider]  polyethylene glycol powder (GLYCOLAX/MIRALAX) 17 GM/SCOOP powder Take by mouth.    [provider]  potassium chloride SA (KLOR-CON M) 20 MEQ tablet Take 1 tablet (20 mEq total) by mouth 2 (two) times daily for 2 days. 10/07/21 10/09/21  Sponseller, Gypsy Balsam, PA-C  Prenatal 27-1 MG TABS  08/25/18   [provider]  Prenatal Vit-Fe Fumarate-FA (PRENATAL MULTIVITAMIN) TABS tablet Take 1 tablet by mouth daily at 12 noon.    [provider]  Quercetin Dihydrate POWD Take by mouth.    [provider]  QUERCETIN PO Take 200 mg by mouth daily. 4 tabs daily (1073m)    [provider]  terbinafine (LAMISIL) 250 MG tablet Take 1 tablet (250 mg total) by mouth daily. 04/04/21   SLandis Martins DPM  Vitamin D, Ergocalciferol, (DRISDOL) 1.25 MG (50000 UT) CAPS capsule Take 50,000 Units by mouth every 7 (seven) days.    [provider]    Physical Exam: Vitals:   10/07/21 2200 10/07/21 2355 10/08/21 0000 10/08/21 0100  BP: 101/88  124/66   Pulse: 89  82 91  Resp: (!) 21  15 (!) 26  Temp:  98.5 F (36.9 C)    TempSrc:  Oral    SpO2: 96%  95% 98%  Weight:      Height:        Physical Exam Constitutional:      General: She is not in acute distress. HENT:     Head: Normocephalic and atraumatic.     Mouth/Throat:     Mouth: Mucous membranes are moist.     Pharynx: Oropharynx is clear.     Comments: No swelling  of tongue or lips Eyes:     Extraocular Movements: Extraocular movements intact.     Conjunctiva/sclera: Conjunctivae normal.  Cardiovascular:     Rate and Rhythm: Normal rate and regular rhythm.  Pulses: Normal pulses.  Pulmonary:     Effort: Pulmonary effort is normal. No respiratory distress.     Breath sounds: Normal breath sounds. No wheezing or rales.  Abdominal:     General: Bowel sounds are normal. There is no distension.     Palpations: Abdomen is soft.     Tenderness: There is no abdominal tenderness.  Musculoskeletal:        General: No swelling or tenderness.     Cervical back: Normal range of motion and neck supple.  Skin:    General: Skin is warm and dry.  Neurological:     General: No focal deficit present.     Mental Status: She is alert and oriented to person, place, and time.     Labs on Admission: I have personally reviewed following labs and imaging studies  CBC: Recent Labs  Lab 10/07/21 1154  WBC 11.3*  NEUTROABS 6.4  HGB 15.3*  HCT 45.1  MCV 93.2  PLT 159   Basic Metabolic Panel: Recent Labs  Lab 10/07/21 1154 10/07/21 1509  NA 139  --   K 2.6* 4.0  CL 110  --   CO2 23  --   GLUCOSE 113*  --   BUN 14  --   CREATININE 0.64  --   CALCIUM 6.6*  --   MG  --  1.9   GFR: Estimated Creatinine Clearance: 131.9 mL/min (by C-G formula based on SCr of 0.64 mg/dL). Liver Function Tests: No results for input(s): AST, ALT, ALKPHOS, BILITOT, PROT, ALBUMIN in the last 168 hours. No results for input(s): LIPASE, AMYLASE in the last 168 hours. No results for input(s): AMMONIA in the last 168 hours. Coagulation Profile: No results for input(s): INR, PROTIME in the last 168 hours. Cardiac Enzymes: No results for input(s): CKTOTAL, CKMB, CKMBINDEX, TROPONINI in the last 168 hours. BNP (last 3 results) Recent Labs    11/09/20 1139  PROBNP 72   HbA1C: No results for input(s): HGBA1C in the last 72 hours. CBG: Recent Labs  Lab 10/07/21 1821   GLUCAP 231*   Lipid Profile: No results for input(s): CHOL, HDL, LDLCALC, TRIG, CHOLHDL, LDLDIRECT in the last 72 hours. Thyroid Function Tests: No results for input(s): TSH, T4TOTAL, FREET4, T3FREE, THYROIDAB in the last 72 hours. Anemia Panel: No results for input(s): VITAMINB12, FOLATE, FERRITIN, TIBC, IRON, RETICCTPCT in the last 72 hours. Urine analysis:    Component Value Date/Time   COLORURINE YELLOW 07/29/2018 [place] 07/29/2018 1544   LABSPEC 1.025 07/29/2018 1544   PHURINE 6.0 07/29/2018 1544   GLUCOSEU NEGATIVE 07/29/2018 1544   HGBUR NEGATIVE 07/29/2018 [place] 07/29/2018 1544   KETONESUR 15 (A) 07/29/2018 1544   PROTEINUR NEGATIVE 07/29/2018 1544   UROBILINOGEN 0.2 02/12/2011 1545   NITRITE NEGATIVE 07/29/2018 1544   LEUKOCYTESUR NEGATIVE 07/29/2018 1544    Radiological Exams on Admission: DG Chest Port 1 View  Result Date: 10/07/2021 CLINICAL DATA:  Anaphylactic allergic reaction EXAM: PORTABLE CHEST 1 VIEW COMPARISON:  05/23/2021 chest radiograph. FINDINGS: Stable configuration of 2 lead left subclavian pacemaker. Stable cardiomediastinal silhouette with normal heart size. No pneumothorax. No pleural effusion. Lungs appear clear, with no acute consolidative airspace disease and no pulmonary edema. IMPRESSION: No active disease. Electronically Signed   By: Ilona Sorrel M.D.   On: 10/07/2021 12:19    EKG: Independently reviewed.  Sinus rhythm, borderline T wave abnormalities.  No significant change since prior tracing.  Assessment/Plan Principal Problem:  Anaphylaxis Active Problems:   Neurocardiogenic syncope   Angioedema   Autoimmune urticaria   Mast cell activation syndrome (HCC)   Angioedema with anaphylaxis in the setting of history of autoimmune urticaria/ mast cell activation syndrome Patient continues to endorse some chest tightness but appears stable.  No increased respiratory effort.  Her lungs are clear on exam,  no wheezing or bronchospasm.  No swelling of tongue or lips.  Speaking clearly in full sentences.  Not hypoxic, tachycardic, or hypotensive.  Chest x-ray showing no active disease.  She did show me a photograph of her facial swelling from this morning and it appears significantly improved at this time. -IV Solu-Medrol 80 mg every 12 hours -Claritin 10 mg twice daily -Albuterol prn -Continuous pulse ox, supplemental oxygen as needed -Requested pharmacy to review all of her current medications to make sure there are drug ingredients she is allergic to.  Since she has multiple food item allergies, also requested RN to read all food ingredient labels to make sure she does not receive anything she is allergic to. -Monitor very closely in the stepdown unit -Patient will need a prolonged steroid taper at the time of discharge and will need very close follow-up with her allergist who is currently discussing starting her on Xolair.  History of neurocardiogenic syncope Status post PPM-she has a pacemaker and is seen by cardiology. -Outpatient cardiology follow-up  Borderline leukocytosis Likely reactive in the setting of steroid use. -Continue to monitor  Hypokalemia? Potassium 2.6 on initial labs and she was given KCl 10 mEq x 2 in the ED.  Likely lab error as potassium 4.0 on repeat labs without visible hemolysis.  Magnesium within normal range. -Repeat BMP  Hypocalcemia Calcium 6.6 on BMP. -Check albumin to calculate corrected calcium level  DVT prophylaxis: SCDs.  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 Code Status: Full code Family Communication: No family available at this time. Disposition Plan: Status is: Observation  The patient remains OBS appropriate and will d/c before 2 midnights.  Level of care: Level of care: Stepdown  The medical decision making on this patient was of high complexity and the patient is at high risk for clinical deterioration, therefore this is a level  3 visit.  Shela Leff MD [facility]  If 7PM-7AM, please contact night-coverage [email or web address]  10/08/2021, 1:56 AM

## 2021-10-09 DIAGNOSIS — R55 Syncope and collapse: Secondary | ICD-10-CM

## 2021-10-09 LAB — BASIC METABOLIC PANEL
Anion gap: 3 — ABNORMAL LOW (ref 5–15)
BUN: 19 mg/dL (ref 6–20)
CO2: 24 mmol/L (ref 22–32)
Calcium: 8 mg/dL — ABNORMAL LOW (ref 8.9–10.3)
Chloride: 107 mmol/L (ref 98–111)
Creatinine, Ser: 0.63 mg/dL (ref 0.44–1.00)
GFR, Estimated: >60 mL/min (ref >60)
Glucose, Bld: 173 mg/dL — ABNORMAL HIGH (ref 70–99)
Potassium: 4.5 mmol/L (ref 3.5–5.1)
Sodium: 134 mmol/L — ABNORMAL LOW (ref 135–145)

## 2021-10-09 LAB — D-DIMER, QUANTITATIVE: D-Dimer, Quant: 0.33 ug/mL-FEU (ref 0.00–0.50)

## 2021-10-09 LAB — MAGNESIUM: Magnesium: 2.3 mg/dL (ref 1.7–2.4)

## 2021-10-09 MED ORDER — SODIUM CHLORIDE 0.9 % IV SOLN: INTRAVENOUS | Status: DC | PRN

## 2021-10-09 MED ORDER — MELATONIN 3 MG PO TABS
3.0000 mg | ORAL_TABLET | Freq: Once | ORAL | Status: DC
  Filled 2021-10-09: qty 1

## 2021-10-09 MED ORDER — BUPIVACAINE-EPINEPHRINE (PF) 0.25% -1:200000 IJ SOLN
INTRAMUSCULAR | Status: AC
  Filled 2021-10-09: qty 30

## 2021-10-09 MED ORDER — SENNA 8.6 MG PO TABS
1.0000 | ORAL_TABLET | Freq: Two times a day (BID) | ORAL | Status: DC
  Administered 2021-10-09 – 2021-10-11 (×4): 8.6 mg via ORAL
  Filled 2021-10-09 (×4): qty 1

## 2021-10-09 MED ORDER — POLYETHYLENE GLYCOL 3350 17 G PO PACK
17.0000 g | PACK | Freq: Once | ORAL | Status: AC | PRN
  Administered 2021-10-09: 17 g via ORAL
  Filled 2021-10-09: qty 1

## 2021-10-09 MED ORDER — TRAMADOL HCL 50 MG PO TABS: 50.0000 mg | ORAL_TABLET | Freq: Once | ORAL | Status: DC | PRN

## 2021-10-09 MED ORDER — TRAMADOL HCL 50 MG PO TABS: 50.0000 mg | ORAL_TABLET | Freq: Once | ORAL | Status: DC

## 2021-10-09 NOTE — Progress Notes (Signed)
Patient awoke out of sleep and alerted staff that phlebotomist entered room wearing some kind of strong scent. This triggered a reaction with symptoms of shortness of breath, swelling around eyes, and tightness in throat. Patient denies itching. Patient took home dose of epinephrine 0.3mg  pen, one tablet Atarax 25mg  (special compound without polysorbate), Albuterol 2 puffs with spacer, and scheduled dose of Solu-Medrol given. On call provider, Gershon Cull, NP informed. No stridor, wheezing, or labored breathing observed. Patient remained alert, oriented, and able to participate in care. Vital signs remain at baseline. Will post larger signs to not enter room without talking to nursing staff due to allergies. Continuing to monitor vitals and condition.

## 2021-10-09 NOTE — TOC Progression Note (Signed)
Transition of Care [facility]) - Progression Note    Patient Details  Name: Erin Casey MRN: [id number] Date of Birth: [birth year]  Transition of [facility]) CM/SW Contact  Purcell Mouton, RN Phone Number: 10/09/2021, 3:43 PM  Clinical Narrative:    TOC reviewed chart and will follow for discharge needs.    Expected Discharge Plan: Home/Self Care Barriers to Discharge: No Barriers Identified  Expected Discharge Plan and Services Expected Discharge Plan: Home/Self Care       Living arrangements for the past 2 months: Single Family Home                                       Social Determinants of Health (SDOH) Interventions    Readmission Risk Interventions No flowsheet data found.

## 2021-10-09 NOTE — Progress Notes (Signed)
PROGRESS NOTE  Erin Casey FWY:[id number] DOB: April 29, 1981 DOA: 10/07/2021 PCP: [name], MD   LOS: 1 day   Brief narrative:  Erin Casey is a 41 y.o. female with past medical history of autoimmune urticaria/mast cell activation syndrome, neurocardiogenic syncope status post PPM presented to hospital with skin itching facial swelling throat.  Near syncope she did have nausea with concerns of anaphylaxis.  She was recently on 5-day course of steroid burst by her primary care physician.  In the ED patient received EpiPen IV Solu-Medrol Pepcid and Benadryl and was admitted hospital for further evaluation.  COVID and influenza was negative.  Chest x-ray was negative.  Patient had reported recurrent episodes of anaphylaxis multiple times in the past with hospitalization.  She was on steroids for 3 months on the last admission when she had seen her allergist in [place] and took.  Currently seen by allergist in [place] and has been diagnosed with autoimmune urticaria and mast cell activation.  Patient is awaiting for Xolair but still pending at this time.   Assessment/Plan:  Principal Problem:   Anaphylaxis Active Problems:   Neurocardiogenic syncope   Angioedema   Autoimmune urticaria   Mast cell activation syndrome (HCC)   Angioedema with anaphylaxis in the setting of history of autoimmune urticaria/ mast cell activation syndrome Continue Pepcid IV, loratadine, IV steroids.  Patient was on 2 Milligrams of prednisone for 5 days as outpatient and soon after stopping it she had an exacerbation.   Continue Solu-Medrol IV 80mg  twice daily, albuterol as needed.  Patient might need prolonged steroid taper at the time of discharge.  Patient has plans to follow-up with her allergist Dr Hinton Rao in [place] who is currently discussing to start her on Xolair.  I tried to call the office of Dr. Hinton Rao in [place] but was unable to talk in person.  I have left a voice  message for the office to call back..   History of neurocardiogenic syncope Status post pacemaker placement.  Full code   Borderline leukocytosis Secondary to steroids.  Normalized this morning.   Hypokalemia Initial potassium was 2.6.  Potassium of 4.5 this morning.   Hypocalcemia Initial calcium low but currently 8.3.  Albumin of 3.6.  Continue vitamin D supplements.   DVT prophylaxis: SCDs Start: 10/08/21 0053   Code Status: Full code  Family Communication: None  Status is: Inpatient  Remains inpatient appropriate because: IV steroids, concerns for ongoing symptoms   Consultants: None  Procedures: None  Anti-infectives:  None  Anti-infectives (From admission, onward)    None       Subjective: Today, patient was seen and examined at bedside.  Patient states that that she did have a strong odor present this morning and had  triggered symptoms of shortness of breath, swelling around the eyes and tightness in the throat and received EpiPen Atarax and albuterol with scheduled Solu-Medrol.  Objective: Vitals:   10/09/21 0800 10/09/21 1000  BP:  137/90  Pulse: 81 73  Resp: (!) 25 15  Temp: 98 F (36.7 C)   SpO2: 97% 95%    Intake/Output Summary (Last 24 hours) at 10/09/2021 1204 Last data filed at 10/09/2021 1006 Gross per 24 hour  Intake 400.75 ml  Output --  Net 400.75 ml   Filed Weights   10/07/21 1112  Weight: 120.7 kg   Body mass index is 38.17 kg/m.   Physical Exam: GENERAL: Patient is alert awake and oriented. Not in obvious distress.  Obese. HENT:  No scleral pallor or icterus. Pupils equally reactive to light. Oral mucosa is moist NECK: is supple, no gross swelling noted. CHEST:  Diminished breath sounds bilaterally. CVS: S1 and S2 heard, no murmur. Regular rate and rhythm.  ABDOMEN: Soft, non-tender, bowel sounds are present. EXTREMITIES: No edema. CNS: Cranial nerves are intact. No focal motor deficits. SKIN: warm and dry without  rashes.  Data Review: I have personally reviewed the following laboratory data and studies,  CBC: Recent Labs  Lab 10/07/21 1154 10/08/21 0237  WBC 11.3* 8.9  NEUTROABS 6.4  --   HGB 15.3* 15.8*  HCT 45.1 47.1*  MCV 93.2 94.0  PLT 214 867   Basic Metabolic Panel: Recent Labs  Lab 10/07/21 1154 10/07/21 1509 10/08/21 0237 10/09/21 0232  NA 139  --  136 134*  K 2.6* 4.0 4.1 4.5  CL 110  --  104 107  CO2 23  --  24 24  GLUCOSE 113*  --  163* 173*  BUN 14  --  12 19  CREATININE 0.64  --  0.54 0.63  CALCIUM 6.6*  --  8.3* 8.0*  MG  --  1.9  --  2.3   Liver Function Tests: Recent Labs  Lab 10/08/21 0237  ALBUMIN 3.6   No results for input(s): LIPASE, AMYLASE in the last 168 hours. No results for input(s): AMMONIA in the last 168 hours. Cardiac Enzymes: No results for input(s): CKTOTAL, CKMB, CKMBINDEX, TROPONINI in the last 168 hours. BNP (last 3 results) Recent Labs    05/23/21 1700  BNP 13.5    ProBNP (last 3 results) Recent Labs    11/09/20 1139  PROBNP 72    CBG: Recent Labs  Lab 10/07/21 1821  GLUCAP 231*   Recent Results (from the past 240 hour(s))  Resp Panel by RT-PCR (Flu A&B, Covid) Nasopharyngeal Swab     Status: None   Collection Time: 10/07/21  7:47 PM   Specimen: Nasopharyngeal Swab; Nasopharyngeal(NP) swabs in vial transport medium  Result Value Ref Range Status   SARS Coronavirus 2 by RT PCR NEGATIVE NEGATIVE Final    Comment: (NOTE) SARS-CoV-2 target nucleic acids are NOT DETECTED.  The SARS-CoV-2 RNA is generally detectable in upper respiratory specimens during the acute phase of infection. The lowest concentration of SARS-CoV-2 viral copies this assay can detect is 138 copies/mL. A negative result does not preclude SARS-Cov-2 infection and should not be used as the sole basis for treatment or other patient management decisions. A negative result may occur with  improper specimen collection/handling, submission of specimen  other than nasopharyngeal swab, presence of viral mutation(s) within the areas targeted by this assay, and inadequate number of viral copies(<138 copies/mL). A negative result must be combined with clinical observations, patient history, and epidemiological information. The expected result is Negative.  Fact Sheet for Patients:  EntrepreneurPulse.com.au  Fact Sheet for Healthcare Providers:  IncredibleEmployment.be  This test is no t yet approved or cleared by the [place] FDA and  has been authorized for detection and/or diagnosis of SARS-CoV-2 by FDA under an Emergency Use Authorization (EUA). This EUA will remain  in effect (meaning this test can be used) for the duration of the COVID-19 declaration under Section 564(b)(1) of the Act, 21 U.S.C.section 360bbb-3(b)(1), unless the authorization is terminated  or revoked sooner.       Influenza A by PCR NEGATIVE NEGATIVE Final   Influenza B by PCR NEGATIVE NEGATIVE Final    Comment: (NOTE) The Xpert Xpress SARS-CoV-2/FLU/RSV  plus assay is intended as an aid in the diagnosis of influenza from Nasopharyngeal swab specimens and should not be used as a sole basis for treatment. Nasal washings and aspirates are unacceptable for Xpert Xpress SARS-CoV-2/FLU/RSV testing.  Fact Sheet for Patients: EntrepreneurPulse.com.au  Fact Sheet for Healthcare Providers: IncredibleEmployment.be  This test is not yet approved or cleared by the [place] FDA and has been authorized for detection and/or diagnosis of SARS-CoV-2 by FDA under an Emergency Use Authorization (EUA). This EUA will remain in effect (meaning this test can be used) for the duration of the COVID-19 declaration under Section 564(b)(1) of the Act, 21 U.S.C. section 360bbb-3(b)(1), unless the authorization is terminated or revoked.  Performed at [facility], [address]  [place], [place] [address]   MRSA Next Gen by PCR, Nasal     Status: None   Collection Time: 10/07/21 11:02 PM   Specimen: Nasal Mucosa; Nasal Swab  Result Value Ref Range Status   MRSA by PCR Next Gen NOT DETECTED NOT DETECTED Final    Comment: (NOTE) The GeneXpert MRSA Assay (FDA approved for NASAL specimens only), is one component of a comprehensive MRSA colonization surveillance program. It is not intended to diagnose MRSA infection nor to guide or monitor treatment for MRSA infections. Test performance is not FDA approved in patients less than 4 years old. Performed at [facility], [place] [address] [place], [place] [address]      Studies: No results found.   Flora Lipps, MD  [facility] 10/09/2021  If 7PM-7AM, please contact night-coverage

## 2021-10-10 MED ORDER — EPINEPHRINE 1 MG/10ML IJ SOSY
PREFILLED_SYRINGE | INTRAMUSCULAR | Status: AC
  Filled 2021-10-10: qty 10

## 2021-10-10 MED ORDER — POLYETHYLENE GLYCOL 3350 17 G PO PACK
17.0000 g | PACK | Freq: Every day | ORAL | Status: DC
  Administered 2021-10-10 – 2021-10-11 (×2): 17 g via ORAL
  Filled 2021-10-10 (×2): qty 1

## 2021-10-10 MED ORDER — EPINEPHRINE 0.3 MG/0.3ML IJ SOAJ
0.3000 mg | Freq: Once | INTRAMUSCULAR | Status: DC | PRN
  Filled 2021-10-10: qty 0.6

## 2021-10-10 MED ORDER — LORATADINE 10 MG PO TABS
20.0000 mg | ORAL_TABLET | Freq: Two times a day (BID) | ORAL | Status: DC
  Administered 2021-10-10 – 2021-10-11 (×2): 20 mg via ORAL
  Filled 2021-10-10 (×2): qty 2

## 2021-10-10 NOTE — Progress Notes (Signed)
PROGRESS NOTE  Erin Casey QPR:[id number] DOB: [birth year] DOA: 10/07/2021 PCP: [name], MD   LOS: 2 days   Brief narrative:  Erin Casey is a 41 y.o. female with past medical history of autoimmune urticaria/mast cell activation syndrome, neurocardiogenic syncope status post PPM presented to hospital with skin itching facial swelling throat.  Near syncope she did have nausea with concerns of anaphylaxis.  She was recently on 5-day course of steroid burst by her primary care physician.  In the ED, patient received EpiPen, IV Solu-Medrol, Pepcid and Benadryl and was admitted hospital for further evaluation.  COVID and influenza was negative.  Chest x-ray was negative.  Patient had reported recurrent episodes of anaphylaxis multiple times in the past with hospitalization.  She was on steroids for 3 months on the last admission when she had seen her allergist in [place] and took.  Currently seen by allergist in [place] and has been diagnosed with autoimmune urticaria and mast cell activation syndrome.  Patient is awaiting for Xolair but still pending at this time.  Assessment/Plan:  Principal Problem:   Anaphylaxis Active Problems:   Neurocardiogenic syncope   Angioedema   Autoimmune urticaria   Mast cell activation syndrome (HCC)   Angioedema with anaphylaxis in the setting of history of autoimmune urticaria/ mast cell activation syndrome Continue Pepcid IV, loratadine, IV steroids.  Patient was on 92 Milligrams of prednisone for 5 days as outpatient and soon after stopping it she had an exacerbation.   Continue Solu-Medrol IV 80mg  twice daily, albuterol as needed. Patient has plans to follow-up with her allergist Dr. Hinton Rao in [place] who is currently discussing to start her on Xolair.  I tried to call the office of Dr. Hinton Rao in [place] but was unable to talk in person.  I have left a voice message for the office to call back but haven't heard yet.    I  did speak with Dr. Altha Harm Radojisic, allergist at [facility] where the patient had been in the past for this problem.  She advised high-dose Zyrtec 20 mg twice a day, Pepcid 20 twice daily and steroid taper.  She is going to follow-up with the patient in her clinic, office to call for an appointment.  She advises high doses of H2 and H1 blockers for this problem and will reevaluate her as outpatient.  I also updated the patient about it.  Will initiate Zyrtec 20 mg twice daily starting today.  If patient continues to feel stable or better, looking at potential disposition home tomorrow.  D-dimer was 0.3.   History of neurocardiogenic syncope Status post pacemaker placement.  Full code   Borderline leukocytosis Secondary to steroids.  Improved   Hypokalemia Initial potassium was 2.6.  Latest potassium 4.5.   Hypocalcemia Initial calcium low but currently 8.3.  Albumin of 3.6.  Continue vitamin D supplements.   DVT prophylaxis: SCDs Start: 10/08/21 0053   Code Status: Full code  Family Communication: None  Status is: Inpatient  Remains inpatient appropriate because: IV steroids, concerns for ongoing symptoms   Consultants: Verbally spoke with allergist at [facility].  Procedures: None  Anti-infectives:  None  Anti-infectives (From admission, onward)    None       Subjective: Today, patient was seen and examined at bedside.  Feels concerned about going home with her flareups and swelling around her eyes and face.   Objective: Vitals:   10/10/21 0400 10/10/21 0951  BP: 133/80   Pulse:  Resp:    Temp:    SpO2:  94%    Intake/Output Summary (Last 24 hours) at 10/10/2021 1104 Last data filed at 10/10/2021 0951 Gross per 24 hour  Intake 366.02 ml  Output --  Net 366.02 ml    Filed Weights   10/07/21 1112  Weight: 120.7 kg   Body mass index is 38.17 kg/m.   Physical Exam: General: Obese built, not in obvious distress HENT:   No  scleral pallor or icterus noted. Oral mucosa is moist.  Mild puffiness around the periorbital area. Chest:   Diminished breath sounds bilaterally. No crackles or wheezes.  CVS: S1 &S2 heard. No murmur.  Regular rate and rhythm. Abdomen: Soft, nontender, nondistended.  Bowel sounds are heard.   Extremities: No cyanosis, clubbing or edema.  Peripheral pulses are palpable. Psych: Alert, awake and oriented, normal mood CNS:  No cranial nerve deficits.  Power equal in all extremities.   Skin: Warm and dry.  No rashes noted.  Data Review: I have personally reviewed the following laboratory data and studies,  CBC: Recent Labs  Lab 10/07/21 1154 10/08/21 0237  WBC 11.3* 8.9  NEUTROABS 6.4  --   HGB 15.3* 15.8*  HCT 45.1 47.1*  MCV 93.2 94.0  PLT 214 295    Basic Metabolic Panel: Recent Labs  Lab 10/07/21 1154 10/07/21 1509 10/08/21 0237 10/09/21 0232  NA 139  --  136 134*  K 2.6* 4.0 4.1 4.5  CL 110  --  104 107  CO2 23  --  24 24  GLUCOSE 113*  --  163* 173*  BUN 14  --  12 19  CREATININE 0.64  --  0.54 0.63  CALCIUM 6.6*  --  8.3* 8.0*  MG  --  1.9  --  2.3    Liver Function Tests: Recent Labs  Lab 10/08/21 0237  ALBUMIN 3.6    No results for input(s): LIPASE, AMYLASE in the last 168 hours. No results for input(s): AMMONIA in the last 168 hours. Cardiac Enzymes: No results for input(s): CKTOTAL, CKMB, CKMBINDEX, TROPONINI in the last 168 hours. BNP (last 3 results) Recent Labs    05/23/21 1700  BNP 13.5     ProBNP (last 3 results) Recent Labs    11/09/20 1139  PROBNP 72     CBG: Recent Labs  Lab 10/07/21 1821  GLUCAP 231*    Recent Results (from the past 240 hour(s))  Resp Panel by RT-PCR (Flu A&B, Covid) Nasopharyngeal Swab     Status: None   Collection Time: 10/07/21  7:47 PM   Specimen: Nasopharyngeal Swab; Nasopharyngeal(NP) swabs in vial transport medium  Result Value Ref Range Status   SARS Coronavirus 2 by RT PCR NEGATIVE NEGATIVE  Final    Comment: (NOTE) SARS-CoV-2 target nucleic acids are NOT DETECTED.  The SARS-CoV-2 RNA is generally detectable in upper respiratory specimens during the acute phase of infection. The lowest concentration of SARS-CoV-2 viral copies this assay can detect is 138 copies/mL. A negative result does not preclude SARS-Cov-2 infection and should not be used as the sole basis for treatment or other patient management decisions. A negative result may occur with  improper specimen collection/handling, submission of specimen other than nasopharyngeal swab, presence of viral mutation(s) within the areas targeted by this assay, and inadequate number of viral copies(<138 copies/mL). A negative result must be combined with clinical observations, patient history, and epidemiological information. The expected result is Negative.  Fact Sheet for Patients:  EntrepreneurPulse.com.au  Fact Sheet for Healthcare Providers:  IncredibleEmployment.be  This test is no t yet approved or cleared by the [place] FDA and  has been authorized for detection and/or diagnosis of SARS-CoV-2 by FDA under an Emergency Use Authorization (EUA). This EUA will remain  in effect (meaning this test can be used) for the duration of the COVID-19 declaration under Section 564(b)(1) of the Act, 21 U.S.C.section 360bbb-3(b)(1), unless the authorization is terminated  or revoked sooner.       Influenza A by PCR NEGATIVE NEGATIVE Final   Influenza B by PCR NEGATIVE NEGATIVE Final    Comment: (NOTE) The Xpert Xpress SARS-CoV-2/FLU/RSV plus assay is intended as an aid in the diagnosis of influenza from Nasopharyngeal swab specimens and should not be used as a sole basis for treatment. Nasal washings and aspirates are unacceptable for Xpert Xpress SARS-CoV-2/FLU/RSV testing.  Fact Sheet for Patients: EntrepreneurPulse.com.au  Fact Sheet for Healthcare  Providers: IncredibleEmployment.be  This test is not yet approved or cleared by the [place] FDA and has been authorized for detection and/or diagnosis of SARS-CoV-2 by FDA under an Emergency Use Authorization (EUA). This EUA will remain in effect (meaning this test can be used) for the duration of the COVID-19 declaration under Section 564(b)(1) of the Act, 21 U.S.C. section 360bbb-3(b)(1), unless the authorization is terminated or revoked.  Performed at [facility], [address] [place], [place] [address]   MRSA Next Gen by PCR, Nasal     Status: None   Collection Time: 10/07/21 11:02 PM   Specimen: Nasal Mucosa; Nasal Swab  Result Value Ref Range Status   MRSA by PCR Next Gen NOT DETECTED NOT DETECTED Final    Comment: (NOTE) The GeneXpert MRSA Assay (FDA approved for NASAL specimens only), is one component of a comprehensive MRSA colonization surveillance program. It is not intended to diagnose MRSA infection nor to guide or monitor treatment for MRSA infections. Test performance is not FDA approved in patients less than 47 years old. Performed at [facility], [place] [address], [place], [place] [address]       Studies: No results found.   Flora Lipps, MD  [facility] 10/10/2021  If 7PM-7AM, please contact night-coverage

## 2021-10-11 LAB — CBC
HCT: 47.2 % — ABNORMAL HIGH (ref 36.0–46.0)
Hemoglobin: 15.5 g/dL — ABNORMAL HIGH (ref 12.0–15.0)
MCH: 31.1 pg (ref 26.0–34.0)
MCHC: 32.8 g/dL (ref 30.0–36.0)
MCV: 94.6 fL (ref 80.0–100.0)
Platelets: 187 10*3/uL (ref 150–400)
RBC: 4.99 MIL/uL (ref 3.87–5.11)
RDW: 11.9 % (ref 11.5–15.5)
WBC: 10.3 10*3/uL (ref 4.0–10.5)
nRBC: 0 % (ref 0.0–0.2)

## 2021-10-11 LAB — TRYPTASE: Tryptase: 2.4 ug/L (ref 2.2–13.2)

## 2021-10-11 LAB — BASIC METABOLIC PANEL
Anion gap: 6 (ref 5–15)
BUN: 18 mg/dL (ref 6–20)
CO2: 27 mmol/L (ref 22–32)
Calcium: 8.1 mg/dL — ABNORMAL LOW (ref 8.9–10.3)
Chloride: 100 mmol/L (ref 98–111)
Creatinine, Ser: 0.59 mg/dL (ref 0.44–1.00)
GFR, Estimated: >60 mL/min (ref >60)
Glucose, Bld: 213 mg/dL — ABNORMAL HIGH (ref 70–99)
Potassium: 4.2 mmol/L (ref 3.5–5.1)
Sodium: 133 mmol/L — ABNORMAL LOW (ref 135–145)

## 2021-10-11 LAB — MAGNESIUM: Magnesium: 2.4 mg/dL (ref 1.7–2.4)

## 2021-10-11 MED ORDER — MELATONIN 5 MG PO TABS
5.0000 mg | ORAL_TABLET | Freq: Once | ORAL | Status: AC
  Administered 2021-10-11: 5 mg via ORAL
  Filled 2021-10-11: qty 1

## 2021-10-11 MED ORDER — FAMOTIDINE 20 MG PO TABS: 20.0000 mg | ORAL_TABLET | Freq: Two times a day (BID) | ORAL | 2 refills | Status: AC

## 2021-10-11 MED ORDER — CETIRIZINE HCL 10 MG PO TABS: 20.0000 mg | ORAL_TABLET | Freq: Two times a day (BID) | ORAL | 1 refills | Status: AC

## 2021-10-11 NOTE — Progress Notes (Addendum)
Pt had an anaphylactic reaction to the scent of lotion after the charge RN opened her door just enough to relay that this RN would answer her call bell shortly. Pt initially took her home dose of atarax 25mg  (special compound without polysorbate), albuterol 2 puffs with spacer, and was given benadryl 25mg  IV. After about 30 minutes, pt requested this RN to look at her throat to check for swelling since her throat felt tight. Swelling was evident and at that point, pt used her bedside epi-pen.  Lovey Newcomer, NP was informed, and pt was monitored closely. 3 bedside fans were set up at the door to her room to assist in preventing scents from entering room. Additionally, all nearby staff alerted of the issue and directed to call this RN for all patient needs and not to open the door (unless emergently necessary). Pharmacy was notified for another epi-pen to keep at bedside. IV epi placed in the room for quick access until epi-pen available.

## 2021-10-11 NOTE — Discharge Summary (Signed)
Physician Discharge Summary  Erin BROOKOVER LPN:[id number] DOB: [birth year] DOA: 10/07/2021  PCP: [name], MD  Admit date: 10/07/2021 Discharge date: 10/11/2021  Admitted From: Home  Discharge disposition: Home  Recommendations for Outpatient Follow-Up:   Follow up with your primary care provider in one week.  Check CBC, BMP, magnesium in the next visit Serum levels of tryptase have been drawn in the hospital will need follow-up as outpatient. Follow-up with allergist at [facility] when scheduled by the clinic.  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  Diet recommendation: Low sodium, heart healthy  Wound care: None.  Code status: Full.   History of Present Illness:   Erin Casey is a 41 y.o. female with past medical history of autoimmune urticaria/mast cell activation syndrome, neurocardiogenic syncope status post PPM presented to hospital with skin itching facial swelling throat.  Near syncope she did have nausea with concerns of anaphylaxis.  She was recently on 5-day course of steroid burst by her primary care physician.  In the ED, patient received EpiPen, IV Solu-Medrol, Pepcid and Benadryl and was admitted hospital for further evaluation.  COVID and influenza was negative.  Chest x-ray was negative.  Patient had reported recurrent episodes of anaphylaxis multiple times in the past with hospitalization.  She was on steroids for 3 months on the last admission when she had seen her allergist in [place] and took.  Currently seen by allergist in [place] and has been diagnosed with autoimmune urticaria and mast cell activation syndrome.  Patient is awaiting for Xolair but still pending at this time.   Hospital Course:   Following conditions were addressed during hospitalization as  listed below,  Angioedema with anaphylaxis in the setting of history of autoimmune urticaria/ mast cell activation syndrome Patient received Pepcid antihistamines and IV steroids during hospitalization and had frequent of episodes of flareup while exposure to strong scent.  . Patient has plans to follow-up with her allergist Dr. Hinton Rao in [place] who is currently discussing to start her on Xolair.  I tried to call the office of Dr. Hinton Rao in [place] but was unable to talk in person. I did speak with Dr. Altha Harm Radojisic, allergist at [facility] where the patient had been in the past for this problem.  She advised high-dose Zyrtec 20 mg twice a day, Pepcid 20 twice daily and steroid taper.  She is going to follow-up with the patient in her clinic, office to call for an appointment.  She advises high doses   History of neurocardiogenic syncope Status post pacemaker placement.    Borderline leukocytosis Secondary to steroids.  Improved   Hypokalemia Initial potassium was 2.6.  Latest potassium 4.2   Hypocalcemia Initial calcium low but currently 8.3.  Albumin of 3.6.  Continue vitamin D supplements.   Disposition.  At this time, patient is stable for disposition home with outpatient PCP and allergist follow-up.  Medical Consultants:   None.  Procedures:    None Subjective:   Today, patient was seen and examined at bedside.  Patient feels overall okay but had some strong response to scent  Discharge Exam:   Vitals:   10/11/21 0105 10/11/21 0400  BP: 107/64 110/63  Pulse: 74 79  Resp: (!) 23 17  Temp:    SpO2: 91% 93%   Vitals:   10/10/21  2250 10/11/21 0000 10/11/21 0105 10/11/21 0400  BP: 138/69  107/64 110/63  Pulse: 78  74 79  Resp: 16  (!) 23 17  Temp:  98.1 F (36.7 C)    TempSrc:  Oral    SpO2: 94%  91% 93%  Weight:      Height:       General: Alert awake, not in obvious distress obese built HENT: pupils equally reacting to  light,  No scleral pallor or icterus noted. Oral mucosa is moist.  Chest:  Clear breath sounds.  No wheezes noted. CVS: S1 &S2 heard. No murmur.  Regular rate and rhythm. Abdomen: Soft, nontender, nondistended.  Bowel sounds are heard.   Extremities: No cyanosis, clubbing or edema.  Peripheral pulses are palpable. Psych: Alert, awake and oriented, normal mood CNS:  No cranial nerve deficits.  Power equal in all extremities.   Skin: Warm and dry.  No rashes noted.  The results of significant diagnostics from this hospitalization (including imaging, microbiology, ancillary and laboratory) are listed below for reference.     Diagnostic Studies:   DG Chest Port 1 View  Result Date: 10/07/2021 CLINICAL DATA:  Anaphylactic allergic reaction EXAM: PORTABLE CHEST 1 VIEW COMPARISON:  05/23/2021 chest radiograph. FINDINGS: Stable configuration of 2 lead left subclavian pacemaker. Stable cardiomediastinal silhouette with normal heart size. No pneumothorax. No pleural effusion. Lungs appear clear, with no acute consolidative airspace disease and no pulmonary edema. IMPRESSION: No active disease. Electronically Signed   By: Ilona Sorrel M.D.   On: 10/07/2021 12:19     Labs:   Basic Metabolic Panel: Recent Labs  Lab 10/07/21 1154 10/07/21 1509 10/08/21 0237 10/09/21 0232 10/11/21 0323  NA 139  --  136 134* 133*  K 2.6* 4.0 4.1 4.5 4.2  CL 110  --  104 107 100  CO2 23  --  _0 GLUCOSE 113*  --  163* 173* 213*  BUN 14  --  _1 CREATININE 0.64  --  0.54 0.63 0.59  CALCIUM 6.6*  --  8.3* 8.0* 8.1*  MG  --  1.9  --  2.3 2.4   GFR Estimated Creatinine Clearance: 131.9 mL/min (by C-G formula based on SCr of 0.59 mg/dL). Liver Function Tests: Recent Labs  Lab 10/08/21 0237  ALBUMIN 3.6   No results for input(s): LIPASE, AMYLASE in the last 168 hours. No results for input(s): AMMONIA in the last 168 hours. Coagulation profile No results for input(s): INR, PROTIME in the last  168 hours.  CBC: Recent Labs  Lab 10/07/21 1154 10/08/21 0237 10/11/21 0323  WBC 11.3* 8.9 10.3  NEUTROABS 6.4  --   --   HGB 15.3* 15.8* 15.5*  HCT 45.1 47.1* 47.2*  MCV 93.2 94.0 94.6  PLT 214 216 187   Cardiac Enzymes: No results for input(s): CKTOTAL, CKMB, CKMBINDEX, TROPONINI in the last 168 hours. BNP: Invalid input(s): POCBNP CBG: Recent Labs  Lab 10/07/21 1821  GLUCAP 231*   D-Dimer Recent Labs    10/09/21 1531  DDIMER 0.33   Hgb A1c No results for input(s): HGBA1C in the last 72 hours. Lipid Profile No results for input(s): CHOL, HDL, LDLCALC, TRIG, CHOLHDL, LDLDIRECT in the last 72 hours. Thyroid function studies No results for input(s): TSH, T4TOTAL, T3FREE, THYROIDAB in the last 72 hours.  Invalid input(s): FREET3 Anemia work up No results for input(s): VITAMINB12, FOLATE, FERRITIN, TIBC, IRON, RETICCTPCT in the last 72 hours. Microbiology Recent Results (from the  past 240 hour(s))  Resp Panel by RT-PCR (Flu A&B, Covid) Nasopharyngeal Swab     Status: None   Collection Time: 10/07/21  7:47 PM   Specimen: Nasopharyngeal Swab; Nasopharyngeal(NP) swabs in vial transport medium  Result Value Ref Range Status   SARS Coronavirus 2 by RT PCR NEGATIVE NEGATIVE Final    Comment: (NOTE) SARS-CoV-2 target nucleic acids are NOT DETECTED.  The SARS-CoV-2 RNA is generally detectable in upper respiratory specimens during the acute phase of infection. The lowest concentration of SARS-CoV-2 viral copies this assay can detect is 138 copies/mL. A negative result does not preclude SARS-Cov-2 infection and should not be used as the sole basis for treatment or other patient management decisions. A negative result may occur with  improper specimen collection/handling, submission of specimen other than nasopharyngeal swab, presence of viral mutation(s) within the areas targeted by this assay, and inadequate number of viral copies(<138 copies/mL). A negative result  must be combined with clinical observations, patient history, and epidemiological information. The expected result is Negative.  Fact Sheet for Patients:  EntrepreneurPulse.com.au  Fact Sheet for Healthcare Providers:  IncredibleEmployment.be  This test is no t yet approved or cleared by the [place] FDA and  has been authorized for detection and/or diagnosis of SARS-CoV-2 by FDA under an Emergency Use Authorization (EUA). This EUA will remain  in effect (meaning this test can be used) for the duration of the COVID-19 declaration under Section 564(b)(1) of the Act, 21 U.S.C.section 360bbb-3(b)(1), unless the authorization is terminated  or revoked sooner.       Influenza A by PCR NEGATIVE NEGATIVE Final   Influenza B by PCR NEGATIVE NEGATIVE Final    Comment: (NOTE) The Xpert Xpress SARS-CoV-2/FLU/RSV plus assay is intended as an aid in the diagnosis of influenza from Nasopharyngeal swab specimens and should not be used as a sole basis for treatment. Nasal washings and aspirates are unacceptable for Xpert Xpress SARS-CoV-2/FLU/RSV testing.  Fact Sheet for Patients: EntrepreneurPulse.com.au  Fact Sheet for Healthcare Providers: IncredibleEmployment.be  This test is not yet approved or cleared by the [place] FDA and has been authorized for detection and/or diagnosis of SARS-CoV-2 by FDA under an Emergency Use Authorization (EUA). This EUA will remain in effect (meaning this test can be used) for the duration of the COVID-19 declaration under Section 564(b)(1) of the Act, 21 U.S.C. section 360bbb-3(b)(1), unless the authorization is terminated or revoked.  Performed at [facility], [address] [place], [place] [address]   MRSA Next Gen by PCR, Nasal     Status: None   Collection Time: 10/07/21 11:02 PM   Specimen: Nasal Mucosa; Nasal Swab  Result Value Ref Range Status    MRSA by PCR Next Gen NOT DETECTED NOT DETECTED Final    Comment: (NOTE) The GeneXpert MRSA Assay (FDA approved for NASAL specimens only), is one component of a comprehensive MRSA colonization surveillance program. It is not intended to diagnose MRSA infection nor to guide or monitor treatment for MRSA infections. Test performance is not FDA approved in patients less than 45 years old. Performed at [facility], [place] [address], [place], [place] [address]      Discharge Instructions:   Discharge Instructions     Call MD for:  hives   Complete by: As directed    Diet - low sodium heart healthy   Complete by: As directed    Discharge instructions   Complete by: As directed    Follow-up  with your primary care physician in 1 week.  Follow-up with your allergy specialist at [facility] when scheduled by the clinic. Continue steroid taper.  Seek medical attention for worsening symptoms.  Please wear a mask to avoid strong sense and smell.  Please seek medical attention for worsening symptoms   Discharge patient   Complete by: As directed    After morning dose of claritin   Discharge disposition: 01-Home or Self Care   Discharge patient date: 10/11/2021   Increase activity slowly   Complete by: As directed       Allergies as of 10/11/2021       Reactions   Cinnamon Anaphylaxis   Crab Extract Allergy Skin Test Anaphylaxis   Eggs Or Egg-derived Products Anaphylaxis   Influenza Virus Vaccine Anaphylaxis   Levaquin [levofloxacin] Anaphylaxis   Pineapple Rash   Swelling, rash, GI upset Swelling, rash, GI upset Swelling, rash, GI upset   Polysorbate Anaphylaxis   Pork-derived Products Anaphylaxis   Sulfa Antibiotics Hives, Nausea Only, Rash   Amoxicillin-pot Clavulanate Other (See Comments)   [Derm] [GI]   Sulfamethoxazole Nausea And Vomiting, Rash        Medication List     STOP taking these medications    benzonatate 100 MG capsule Commonly known as:  TESSALON       TAKE these medications    albuterol 108 (90 Base) MCG/ACT inhaler Commonly known as: VENTOLIN HFA Inhale into the lungs every 4 (four) hours as needed for wheezing.   cetirizine 10 MG tablet Commonly known as: ZYRTEC Take 2 tablets (20 mg total) by mouth 2 (two) times daily. What changed: how much to take   cyanocobalamin 1000 MCG/ML injection Commonly known as: (VITAMIN B-12) Inject 1,000 mcg into the muscle every 14 (fourteen) days.   EPINEPHrine 0.3 mg/0.3 mL Soaj injection Commonly known as: EpiPen 2-Pak Inject 0.3 mLs (0.3 mg total) into the muscle once as needed for up to 1 dose (for severe allergic reaction). CAll 911 immediately if you have to use this medicine What changed: Another medication with the same name was added. Make sure you understand how and when to take each.   EPINEPHrine 0.3 mg/0.3 mL Soaj injection Commonly known as: EPI-PEN Inject 0.3 mg into the muscle as needed for anaphylaxis. What changed: You were already taking a medication with the same name, and this prescription was added. Make sure you understand how and when to take each.   famotidine 20 MG tablet Commonly known as: Pepcid Take 1 tablet (20 mg total) by mouth 2 (two) times daily.   GlucoCom Blood Glucose Monitor Devi 1 each by XX route as directed One touch verio reflect meter   ONE TOUCH ULTRA 2 w/Device Kit   Psychologist, occupational w/Device Kit USE AS DIRECTED THREE TIMES A DAY FOR CBG MONITORING   MAGNESIUM MALATE PO Take 4 tablets by mouth at bedtime.   methylphenidate 54 MG CR tablet Commonly known as: CONCERTA Take 1 tablet (54 mg total) by mouth every morning.   OneTouch Verio test strip Generic drug: glucose blood 1 each 3 (three) times daily.   predniSONE 10 MG tablet Commonly known as: DELTASONE Take 6 tablets by mouth daily for 2 days, then 5 tablets for 2 days, then 4 tablets for 2 days, then 3 tablets for 2 days, then 2 tablets for 2 days, then 1  tablet for 2 days. What changed: how much to take   PRESCRIPTION MEDICATION Take 2 tablets by mouth as  needed (anaphylactic shock/allergic reaction). Atarax made by [facility] without polysorbate   PROBIOTIC PO Take 1 capsule by mouth daily.   QUERCETIN PO Take 1,000 mg by mouth daily.   Vitamin D (Ergocalciferol) 1.25 MG (50000 UNIT) Caps capsule Commonly known as: DRISDOL Take 50,000 Units by mouth every 7 (seven) days.        Follow-up Information     [facility] EMERGENCY DEPARTMENT.   Specialty: Emergency Medicine Contact information: [address] [id number] [place] [address] [phone]                 Time coordinating discharge: 39 minutes  Signed:  Lilyth Lawyer  [facility] 10/11/2021, 8:06 AM

## 2021-10-11 NOTE — Progress Notes (Signed)
[place], NP notified of 26 run of [place]. No distress noted at this time. Pt encouraged to call if feeling any concerning symptoms. Strip saved and placed on chart.

## 2021-10-13 ENCOUNTER — Encounter: Payer: Self-pay | Admitting: Internal Medicine

## 2021-10-17 ENCOUNTER — Encounter: Payer: Self-pay | Admitting: Internal Medicine

## 2021-10-18 ENCOUNTER — Encounter (HOSPITAL_BASED_OUTPATIENT_CLINIC_OR_DEPARTMENT_OTHER): Payer: Self-pay

## 2021-10-18 ENCOUNTER — Other Ambulatory Visit: Payer: Self-pay

## 2021-10-18 ENCOUNTER — Emergency Department (HOSPITAL_BASED_OUTPATIENT_CLINIC_OR_DEPARTMENT_OTHER)
Admission: EM | Admit: 2021-10-18 | Discharge: 2021-10-18 | Disposition: A | Discharge status: Home / Self Care | Payer: 59 | Attending: Emergency Medicine | Admitting: Emergency Medicine

## 2021-10-18 DIAGNOSIS — T7840XA Allergy, unspecified, initial encounter: Secondary | ICD-10-CM | POA: Diagnosis not present

## 2021-10-18 DIAGNOSIS — Z79899 Other long term (current) drug therapy: Secondary | ICD-10-CM | POA: Diagnosis not present

## 2021-10-18 DIAGNOSIS — Z7952 Long term (current) use of systemic steroids: Secondary | ICD-10-CM | POA: Insufficient documentation

## 2021-10-18 LAB — CBC WITH DIFFERENTIAL/PLATELET
Abs Immature Granulocytes: 0.08 10*3/uL — ABNORMAL HIGH (ref 0.00–0.07)
Basophils Absolute: 0 10*3/uL (ref 0.0–0.1)
Basophils Relative: 0 %
Eosinophils Absolute: 0 10*3/uL (ref 0.0–0.5)
Eosinophils Relative: 0 %
HCT: 47.3 % — ABNORMAL HIGH (ref 36.0–46.0)
Hemoglobin: 16.2 g/dL — ABNORMAL HIGH (ref 12.0–15.0)
Immature Granulocytes: 1 %
Lymphocytes Relative: 5 %
Lymphs Abs: 0.6 10*3/uL — ABNORMAL LOW (ref 0.7–4.0)
MCH: 31.3 pg (ref 26.0–34.0)
MCHC: 34.2 g/dL (ref 30.0–36.0)
MCV: 91.3 fL (ref 80.0–100.0)
Monocytes Absolute: 0.2 10*3/uL (ref 0.1–1.0)
Monocytes Relative: 1 %
Neutro Abs: 12.6 10*3/uL — ABNORMAL HIGH (ref 1.7–7.7)
Neutrophils Relative %: 93 %
Platelets: 164 10*3/uL (ref 150–400)
RBC: 5.18 MIL/uL — ABNORMAL HIGH (ref 3.87–5.11)
RDW: 12 % (ref 11.5–15.5)
WBC: 13.5 10*3/uL — ABNORMAL HIGH (ref 4.0–10.5)
nRBC: 0 % (ref 0.0–0.2)

## 2021-10-18 LAB — BASIC METABOLIC PANEL
Anion gap: 9 (ref 5–15)
BUN: 17 mg/dL (ref 6–20)
CO2: 25 mmol/L (ref 22–32)
Calcium: 8.7 mg/dL — ABNORMAL LOW (ref 8.9–10.3)
Chloride: 100 mmol/L (ref 98–111)
Creatinine, Ser: 0.71 mg/dL (ref 0.44–1.00)
GFR, Estimated: >60 mL/min (ref >60)
Glucose, Bld: 232 mg/dL — ABNORMAL HIGH (ref 70–99)
Potassium: 4 mmol/L (ref 3.5–5.1)
Sodium: 134 mmol/L — ABNORMAL LOW (ref 135–145)

## 2021-10-18 MED ORDER — FAMOTIDINE 20 MG PO TABS
20.0000 mg | ORAL_TABLET | Freq: Once | ORAL | Status: AC
  Administered 2021-10-18: 20 mg via ORAL
  Filled 2021-10-18: qty 1

## 2021-10-18 MED ORDER — PREDNISONE 10 MG PO TABS: ORAL_TABLET | Freq: Every day | ORAL | 0 refills | Status: DC

## 2021-10-18 NOTE — Discharge Instructions (Signed)
Please follow-up with your allergy specialist and your primary care doctor.  Come back to ER as needed for recurrent allergic reaction symptoms.  If you require EpiPen use would recommend coming back immediately to ER.  Start taking the steroid course as discussed.

## 2021-10-18 NOTE — ED Provider Notes (Signed)
[facility] EMERGENCY DEPARTMENT Provider Note   CSN: [id number] Arrival date & time: 10/18/21  1627     History  Chief Complaint  Patient presents with   Allergic Reaction    Erin Casey is a 41 y.o. female.  Presented to ER with concern for allergic reaction.  Patient reports that she was discharged from ICU due to anaphylaxis on Friday.  States that she was taking prednisone taper, a couple days ago went down to 30 mg daily.  This morning when she was laying in bed she felt like her throat became tight and was nauseated, broke out in hives.  Gave herself a dose of epinephrine and called EMS.  EMS gave additional epi dose and dose of Solu-Medrol, Benadryl, Atarax and albuterol.  Patient reports that all of her symptoms seem to be getting better, currently denies any chest discomfort or difficulty in breathing, no throat swelling.  No nausea or vomiting.  Reviewed discharge summary from recent hospitalization by hospitalist service.  History of autoimmune urticaria/mast cell activation syndrome, neurocardiogenic syncope status post PPM.  Admitted due to concern for anaphylaxis.  HPI     Home Medications Prior to Admission medications   Medication Sig Start Date End Date Taking? Authorizing Provider  albuterol (PROVENTIL HFA;VENTOLIN HFA) 108 (90 Base) MCG/ACT inhaler Inhale into the lungs every 4 (four) hours as needed for wheezing.     [provider]  Blood Glucose Monitoring Suppl (GLUCOCOM BLOOD GLUCOSE MONITOR) DEVI 1 each by XX route as directed One touch verio reflect meter    [provider]  Blood Glucose Monitoring Suppl (ONE TOUCH ULTRA 2) w/Device KIT  11/21/18   [provider]  Blood Glucose Monitoring Suppl (ONETOUCH VERIO REFLECT) w/Device KIT USE AS DIRECTED THREE TIMES A DAY FOR CBG MONITORING 05/14/20   [provider]  cetirizine (ZYRTEC) 10 MG tablet Take 2 tablets (20 mg total) by mouth 2 (two) times daily. 10/11/21  11/10/21  Pokhrel, Corrie Mckusick, MD  cyanocobalamin (,VITAMIN B-12,) 1000 MCG/ML injection Inject 1,000 mcg into the muscle every 14 (fourteen) days.     [provider]  EPINEPHrine (EPIPEN 2-PAK) 0.3 mg/0.3 mL IJ SOAJ injection Inject 0.3 mLs (0.3 mg total) into the muscle once as needed for up to 1 dose (for severe allergic reaction). CAll 911 immediately if you have to use this medicine 01/23/20   Margarita Mail, PA-C  EPINEPHrine 0.3 mg/0.3 mL IJ SOAJ injection Inject 0.3 mg into the muscle as needed for anaphylaxis. 10/07/21   Sponseller, Gypsy Balsam, PA-C  famotidine (PEPCID) 20 MG tablet Take 1 tablet (20 mg total) by mouth 2 (two) times daily. 10/11/21 11/10/21  Pokhrel, Corrie Mckusick, MD  MAGNESIUM MALATE PO Take 4 tablets by mouth at bedtime.    [provider]  methylphenidate 54 MG PO CR tablet Take 1 tablet (54 mg total) by mouth every morning. 01/11/19   Deboraha Sprang, MD  [facility] VERIO test strip 1 each 3 (three) times daily. 07/17/20   [provider]  predniSONE (DELTASONE) 10 MG tablet Take 6 tablets by mouth daily for 2 days, then 5 tablets for 2 days, then 4 tablets for 2 days, then 3 tablets for 2 days, then 2 tablets for 2 days, then 1 tablet for 2 days. 10/18/21   Lucrezia Starch, MD  PRESCRIPTION MEDICATION Take 2 tablets by mouth as needed (anaphylactic shock/allergic reaction). Atarax made by [facility] without polysorbate    [provider]  Probiotic Product (PROBIOTIC PO) Take 1 capsule by mouth daily.    [provider]  QUERCETIN PO Take 1,000 mg by mouth daily.    [provider]  Vitamin D, Ergocalciferol, (DRISDOL) 1.25 MG (50000 UT) CAPS capsule Take 50,000 Units by mouth every 7 (seven) days.    [provider]      Allergies    Cinnamon, Crab extract allergy skin test, Eggs or egg-derived products, Influenza virus vaccine, Levaquin [levofloxacin], Pineapple, Polysorbate, Pork-derived products, Sulfa  antibiotics, Amoxicillin-pot clavulanate, and Sulfamethoxazole    Review of Systems   Review of Systems  Constitutional:  Positive for fatigue. Negative for chills and fever.  HENT:  Positive for sore throat. Negative for ear pain.   Eyes:  Negative for pain and visual disturbance.  Respiratory:  Positive for shortness of breath. Negative for cough.   Cardiovascular:  Negative for chest pain and palpitations.  Gastrointestinal:  Negative for abdominal pain and vomiting.  Genitourinary:  Negative for dysuria and hematuria.  Musculoskeletal:  Negative for arthralgias and back pain.  Skin:  Negative for color change and rash.  Neurological:  Negative for seizures and syncope.  All other systems reviewed and are negative.  Physical Exam Updated Vital Signs BP (!) 150/91 (BP Location: Right Arm)    Pulse (!) 106    Resp 16    Wt 120 kg    SpO2 96%    BMI 37.96 kg/m  Physical Exam Vitals and nursing note reviewed.  Constitutional:      General: She is not in acute distress.    Appearance: She is well-developed.  HENT:     Head: Normocephalic and atraumatic.     Right Ear: Tympanic membrane normal.     Left Ear: Tympanic membrane normal.     Mouth/Throat:     Mouth: Mucous membranes are moist.     Pharynx: Oropharynx is clear. No oropharyngeal exudate or posterior oropharyngeal erythema.  Eyes:     Conjunctiva/sclera: Conjunctivae normal.  Cardiovascular:     Rate and Rhythm: Normal rate and regular rhythm.     Heart sounds: No murmur heard. Pulmonary:     Effort: Pulmonary effort is normal. No respiratory distress.     Breath sounds: Normal breath sounds.  Abdominal:     Palpations: Abdomen is soft.     Tenderness: There is no abdominal tenderness.  Musculoskeletal:        General: No swelling.     Cervical back: Neck supple.  Skin:    General: Skin is warm and dry.     Capillary Refill: Capillary refill takes less than 2 seconds.  Neurological:     General: No focal  deficit present.     Mental Status: She is alert.  Psychiatric:        Mood and Affect: Mood normal.    ED Results / Procedures / Treatments   Labs (all labs ordered are listed, but only abnormal results are displayed) Labs Reviewed  CBC WITH DIFFERENTIAL/PLATELET - Abnormal; Notable for the following components:      Result Value   WBC 13.5 (*)    RBC 5.18 (*)    Hemoglobin 16.2 (*)    HCT 47.3 (*)    Neutro Abs 12.6 (*)    Lymphs Abs 0.6 (*)    Abs Immature Granulocytes 0.08 (*)    All other components within normal limits  BASIC METABOLIC PANEL - Abnormal; Notable for the following components:   Sodium 134 (*)  Glucose, Bld 232 (*)    Calcium 8.7 (*)    All other components within normal limits    EKG None  Radiology No results found.  Procedures Procedures    Medications Ordered in ED Medications  famotidine (PEPCID) tablet 20 mg (20 mg Oral Given 10/18/21 1718)    ED Course/ Medical Decision Making/ A&P                           Medical Decision Making Amount and/or Complexity of Data Reviewed Labs: ordered.  Risk Prescription drug management.   41 year old lady presenting to ER with concern for allergic reaction.  Additional history obtained from chart review, EMS report.  Recent admission for anaphylaxis, reviewed prior records, reviewed discharge summary from recent hospitalization by hospitalist service.  History of autoimmune urticaria/mast cell activation syndrome, neurocardiogenic syncope status post PPM.  Patient prolonged prednisone taper and had recently decreased dose.  Today had symptoms concerning for allergic reaction, received 2 doses of epinephrine prior to arrival in addition to Solu-Medrol, Benadryl, Atarax and albuterol.  Here on my assessment, patient did not have any ongoing symptoms.  No stridor, oropharynx appeared patent.  She was observed in ER for a few hours and did not have any recurrence of her symptoms.  Given her well  appearance, lack of any recurrent symptoms, feel she can be managed in the outpatient setting and does not require repeat admission today.  Will recommend that she go back on the higher dose of steroids and restart a steroid taper.  Recommend she have close follow-up with her primary doctor as well as her allergy specialist.  Reviewed return precautions and discharged.    After the discussed management above, the patient was determined to be safe for discharge.  The patient was in agreement with this plan and all questions regarding their care were answered.  ED return precautions were discussed and the patient will return to the ED with any significant worsening of condition.        Final Clinical Impression(s) / ED Diagnoses Final diagnoses:  Allergic reaction, initial encounter    Rx / DC Orders ED Discharge Orders          Ordered    predniSONE (DELTASONE) 10 MG tablet  Daily        10/18/21 2018              Lucrezia Starch, MD 10/18/21 2048

## 2021-10-18 NOTE — ED Triage Notes (Signed)
Pt brought in by EMS due to allergic reaction. Pt discharged from ICU last Friday due to allergic reaction. Pt reports she decreased prednisone dose pac from 60 mg to 30 mg 2 days ago. Pt states she was laying in bed and throat became tight, nauseated. Developed hives. She gave herself one epi and when EMS arrived EPI adm again. Also received solumedrol Benadryl 50mg , atarax and albuterol. Upon arriving reports chest tightness. No hives noted. No swelling in throat

## 2021-10-21 ENCOUNTER — Encounter: Payer: Self-pay | Admitting: Internal Medicine

## 2021-10-23 ENCOUNTER — Other Ambulatory Visit (HOSPITAL_COMMUNITY): Payer: Self-pay | Admitting: Family Medicine

## 2021-10-23 ENCOUNTER — Ambulatory Visit (HOSPITAL_COMMUNITY)
Admission: RE | Admit: 2021-10-23 | Discharge: 2021-10-23 | Disposition: A | Discharge status: Home / Self Care | Payer: 59 | Source: Ambulatory Visit | Attending: Cardiovascular Disease | Admitting: Cardiovascular Disease

## 2021-10-23 ENCOUNTER — Other Ambulatory Visit: Payer: Self-pay

## 2021-10-23 DIAGNOSIS — I808 Phlebitis and thrombophlebitis of other sites: Secondary | ICD-10-CM | POA: Diagnosis present

## 2021-10-30 ENCOUNTER — Encounter: Payer: Self-pay | Admitting: Internal Medicine

## 2021-11-13 ENCOUNTER — Telehealth: Payer: Self-pay

## 2021-11-13 ENCOUNTER — Other Ambulatory Visit (HOSPITAL_COMMUNITY): Payer: Self-pay | Admitting: Physician Assistant

## 2021-11-13 ENCOUNTER — Telehealth: Payer: Self-pay | Admitting: Internal Medicine

## 2021-11-13 ENCOUNTER — Other Ambulatory Visit: Payer: Self-pay

## 2021-11-13 ENCOUNTER — Ambulatory Visit (HOSPITAL_COMMUNITY)
Admission: RE | Admit: 2021-11-13 | Discharge: 2021-11-13 | Disposition: A | Discharge status: Home / Self Care | Payer: 59 | Source: Ambulatory Visit | Attending: Cardiology | Admitting: Cardiology

## 2021-11-13 DIAGNOSIS — M79601 Pain in right arm: Secondary | ICD-10-CM | POA: Insufficient documentation

## 2021-11-13 NOTE — Telephone Encounter (Signed)
The patient states she had an ICU stay for a week. She would like to talk with Dr. Caryl Comes nurse about recommending her for a vascular study. Her primary care is out of town and recommended that she call one of her other doctors to get into the study. She also thinks she has another blood clot on her arms. She did not have an issue with her pacemaker.

## 2021-11-13 NOTE — Telephone Encounter (Signed)
Patient is requesting to have ultrasound done. Please advise

## 2021-11-13 NOTE — Telephone Encounter (Signed)
Returned call to patient who states she is at Urgent Care now-back in a room, but awaiting to see doctor. States she has a blood clot in her R wrist that is 3-4 inches long and painful and was wanting to get ultrasound done at the [place] office. Advised pt that she needs an order, so to follow-through with UC and ask them or call the [place] [facility] when she leaves if appropriate.

## 2021-11-13 NOTE — Telephone Encounter (Signed)
Pt called this morning while at urgent care stating that she felt she had a blood clot in her R forearm/wrist area and was wanting an ultrasound done at our [facility] office to verify since she had been there in the past for other clots. Informed pt that physician at Urgent care will order the ultrasound and she should ask/request if the [place] could be done at [facility]. Pt just called back now, stating that she was able to go to our [facility] office for the ultrasound and the UC physician called her to say that she does have a clot, but they are not able to prescribe her medication or treatment for it, she needs to f/u with her PCP. Pt states that PCP is out of town until next week. Pt now reaching out to Dr Caryl Comes because she is concerned that "if she formed this clot on her own, there may be more somewhere else and I don't know what to do."  Will forward as an [place] to [place] for recommendation

## 2021-11-14 ENCOUNTER — Ambulatory Visit (INDEPENDENT_AMBULATORY_CARE_PROVIDER_SITE_OTHER): Payer: 59

## 2021-11-14 DIAGNOSIS — R55 Syncope and collapse: Secondary | ICD-10-CM

## 2021-11-14 NOTE — Telephone Encounter (Signed)
Pt's upper extremity vascular study positive for clot 11/13/2021.  Pt's PCP office has no one to see her for further evaluation and UC provider will not treat.  UC provider is planning to make a referral to hematology. She is not on a current blood thinner.  Will forward to Dr Caryl Comes for review and recommendations.

## 2021-11-14 NOTE — Telephone Encounter (Signed)
Spoke with pt and advised per Dr Caryl Comes, treatment for superficial clot is not emergent and may not be necessary but a referral to hematology is appropriate.   Pt states  referral was made to hematology by urgent care and pt is awaiting scheduling.  Pt thanked Therapist, sports for the call.

## 2021-11-14 NOTE — Telephone Encounter (Signed)
This is being addressed in another encounter dated 11/13/2021.  Please see that encounter for complete details.

## 2021-11-15 ENCOUNTER — Telehealth: Payer: Self-pay

## 2021-11-15 LAB — CUP PACEART REMOTE DEVICE CHECK
Date Time Interrogation Session: 20230217072840
Implantable Lead Implant Date: 20130708
Implantable Lead Implant Date: 20130708
Implantable Lead Location: 753859
Implantable Lead Location: 753860
Implantable Lead Model: 350
Implantable Lead Model: 350
Implantable Lead Serial Number: 29222139
Implantable Lead Serial Number: 29231638
Implantable Pulse Generator Implant Date: 20130708
Pulse Gen Serial Number: 66303845

## 2021-11-15 NOTE — Telephone Encounter (Signed)
Transmission from 11/14/21 reviewed. Todays transmission has not been received and patient is unable to force transmission with biotronik. Patient states she was unable to sleep well last night and felt like her heart was not in rhythm. Symptoms have now resolved. Will review transmission on Monday to assess for last nights episode. Patient appreciative of follow up. Will continue to monitor.

## 2021-11-15 NOTE — Telephone Encounter (Signed)
Patient called in stating at 1am she felt her heart beating really hard like out of her chest and it felt different from all the other times. Patient also states that she had trouble breathing at the time. Patient would like a nurse to go over her transmission

## 2021-11-19 ENCOUNTER — Telehealth: Payer: Self-pay

## 2021-11-19 NOTE — Progress Notes (Signed)
Remote pacemaker transmission.   

## 2021-11-19 NOTE — Telephone Encounter (Signed)
Patient called concerning an abnormal heart rhythm 11/15/21. Checked Biotronik and no alerts noted on 11/15/21. Answered patients questions and apt made (overdue) 01/10/22 per patient request with SK. Offered EP APP, patient declined.

## 2021-11-19 NOTE — Telephone Encounter (Signed)
Patient called back wanting a nurse to look at her transmission, same reason as stated below. Her monitor sends at night so she wants it to be looked at from 11/15/2021. Patient is aware a nurse will call her back once they look at it

## 2021-11-24 ENCOUNTER — Encounter: Payer: Self-pay | Admitting: Internal Medicine

## 2021-12-12 ENCOUNTER — Telehealth: Payer: Self-pay

## 2021-12-12 NOTE — Telephone Encounter (Signed)
Biotronik alert received for no message received for at least 21 days. Successful telephone encounter to patient to assess. Patient states remote monitor is plugged in however yellow light has been going off and on. Patient is provided Biotronik tech support and advised to call for troubleshooting. Will check for connection and remote transmission in the am.  ?

## 2021-12-17 ENCOUNTER — Emergency Department (HOSPITAL_BASED_OUTPATIENT_CLINIC_OR_DEPARTMENT_OTHER)
Admission: EM | Admit: 2021-12-17 | Discharge: 2021-12-17 | Disposition: A | Discharge status: Home / Self Care | Payer: 59 | Attending: Emergency Medicine | Admitting: Emergency Medicine

## 2021-12-17 ENCOUNTER — Encounter (HOSPITAL_BASED_OUTPATIENT_CLINIC_OR_DEPARTMENT_OTHER): Payer: Self-pay

## 2021-12-17 ENCOUNTER — Other Ambulatory Visit: Payer: Self-pay

## 2021-12-17 DIAGNOSIS — I10 Essential (primary) hypertension: Secondary | ICD-10-CM | POA: Diagnosis not present

## 2021-12-17 DIAGNOSIS — J45909 Unspecified asthma, uncomplicated: Secondary | ICD-10-CM | POA: Insufficient documentation

## 2021-12-17 DIAGNOSIS — Z950 Presence of cardiac pacemaker: Secondary | ICD-10-CM | POA: Insufficient documentation

## 2021-12-17 DIAGNOSIS — L03311 Cellulitis of abdominal wall: Secondary | ICD-10-CM | POA: Diagnosis not present

## 2021-12-17 DIAGNOSIS — R55 Syncope and collapse: Secondary | ICD-10-CM | POA: Diagnosis present

## 2021-12-17 LAB — MAGNESIUM: Magnesium: 2 mg/dL (ref 1.7–2.4)

## 2021-12-17 LAB — CBC WITH DIFFERENTIAL/PLATELET
Abs Immature Granulocytes: 0.01 10*3/uL (ref 0.00–0.07)
Basophils Absolute: 0 10*3/uL (ref 0.0–0.1)
Basophils Relative: 1 %
Eosinophils Absolute: 0.1 10*3/uL (ref 0.0–0.5)
Eosinophils Relative: 2 %
HCT: 44.6 % (ref 36.0–46.0)
Hemoglobin: 15 g/dL (ref 12.0–15.0)
Immature Granulocytes: 0 %
Lymphocytes Relative: 36 %
Lymphs Abs: 1.7 10*3/uL (ref 0.7–4.0)
MCH: 31.4 pg (ref 26.0–34.0)
MCHC: 33.6 g/dL (ref 30.0–36.0)
MCV: 93.5 fL (ref 80.0–100.0)
Monocytes Absolute: 0.5 10*3/uL (ref 0.1–1.0)
Monocytes Relative: 10 %
Neutro Abs: 2.5 10*3/uL (ref 1.7–7.7)
Neutrophils Relative %: 51 %
Platelets: 172 10*3/uL (ref 150–400)
RBC: 4.77 MIL/uL (ref 3.87–5.11)
RDW: 12.8 % (ref 11.5–15.5)
WBC: 4.9 10*3/uL (ref 4.0–10.5)
nRBC: 0 % (ref 0.0–0.2)

## 2021-12-17 LAB — URINALYSIS, ROUTINE W REFLEX MICROSCOPIC
Bilirubin Urine: NEGATIVE
Glucose, UA: NEGATIVE mg/dL
Hgb urine dipstick: NEGATIVE
Ketones, ur: NEGATIVE mg/dL
Leukocytes,Ua: NEGATIVE
Nitrite: NEGATIVE
Protein, ur: NEGATIVE mg/dL
Specific Gravity, Urine: 1.025 (ref 1.005–1.030)
pH: 7 (ref 5.0–8.0)

## 2021-12-17 LAB — COMPREHENSIVE METABOLIC PANEL
ALT: 24 U/L (ref 0–44)
AST: 16 U/L (ref 15–41)
Albumin: 3.9 g/dL (ref 3.5–5.0)
Alkaline Phosphatase: 31 U/L — ABNORMAL LOW (ref 38–126)
Anion gap: 8 (ref 5–15)
BUN: 14 mg/dL (ref 6–20)
CO2: 26 mmol/L (ref 22–32)
Calcium: 8.8 mg/dL — ABNORMAL LOW (ref 8.9–10.3)
Chloride: 106 mmol/L (ref 98–111)
Creatinine, Ser: 0.73 mg/dL (ref 0.44–1.00)
GFR, Estimated: >60 mL/min (ref >60)
Glucose, Bld: 106 mg/dL — ABNORMAL HIGH (ref 70–99)
Potassium: 4.1 mmol/L (ref 3.5–5.1)
Sodium: 140 mmol/L (ref 135–145)
Total Bilirubin: 0.4 mg/dL (ref 0.3–1.2)
Total Protein: 6.1 g/dL — ABNORMAL LOW (ref 6.5–8.1)

## 2021-12-17 LAB — TROPONIN I (HIGH SENSITIVITY): Troponin I (High Sensitivity): <2 ng/L (ref <18)

## 2021-12-17 LAB — LACTIC ACID, PLASMA: Lactic Acid, Venous: 1 mmol/L (ref 0.5–1.9)

## 2021-12-17 MED ORDER — CEPHALEXIN 500 MG PO CAPS: 500.0000 mg | ORAL_CAPSULE | Freq: Three times a day (TID) | ORAL | 0 refills | Status: AC

## 2021-12-17 NOTE — ED Provider Notes (Signed)
?[facility] ?Provider Note ? ? ?CSN: [id number] ?Arrival date & time: 12/17/21  1021 ? ?  ? ?History ? ?Chief Complaint  ?Patient presents with  ? Near Syncope  ? ? ?Erin Casey is a 41 y.o. female. ? ?HPI ? ?  ? 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 ? ? ?Has been having near syncope since Tuesday/Wednesday ?Her pacemaker remote monitor has not been transmitting and she is unsure if pacemaker is working or if monitor is not working ?Felt similar prior to having pacemaker placed with dysautonomia, but has been doing well since pacemaker in place ?Now has constant feeling like she is going to pass out.  Feels like heart rate and BP fluctuating at home--measured HR to 200s. No syncope. No dyspnea. Has occasional intermittent chest pain which is chronic and notes brief episode of chest pressure this AM that resolved. ?No nausea, vomiting, black or bloody stools ?2 days ago did develop area of redness left mons ?Xolair frequency increased ?Past Medical History:  ?Diagnosis Date  ? Anemia   ? Depletion of volume of extracellular fluid   ? Dysautonomia ([place]) 2003  ? Essential hypertension 04/20/2013  ? GERD (gastroesophageal reflux disease)   ? Neurocardiogenic syncope   ? Neurocardiogenic syncope 2003  ? Neurologic cardiac syncope   ? Pacemaker-Biotronik 04/05/2012  ? Vitamin B12 deficiency 07/28/2018  ?  ?Past Surgical History:  ?Procedure Laterality Date  ? ABDOMINAL HYSTERECTOMY    ? CESAREAN SECTION    ? CHOLECYSTECTOMY    ? hysterectomy    ? LAPAROSCOPIC GASTRIC BANDING    ? With reversal   ? PACEMAKER INSERTION    ? PVC ABLATION N/A 03/12/2018  ? Procedure: PVC ABLATION;  Surgeon: Evans Lance, MD;  Location: [facility];  Service: Cardiovascular;  Laterality: N/A;  ? TONSILLECTOMY AND ADENOIDECTOMY    ? TUBAL LIGATION     ? ULNAR TUNNEL RELEASE Right 07/28/2019  ? Procedure: Right carpal tunnel release, right cubital tunnel release;  Surgeon: Verner Mould, MD;  Location: [facility];  Service: Orthopedics;  Laterality: Right;  ?  ? ?Home Medications ?Prior to Admission medications   ?Medication Sig Start Date End Date Taking? Authorizing Provider  ?cephALEXin (KEFLEX) 500 MG capsule Take 1 capsule (500 mg total) by mouth 3 (three) times daily for 7 days. 12/17/21 12/24/21 Yes Gareth Morgan, MD  ?albuterol (PROVENTIL HFA;VENTOLIN HFA) 108 (90 Base) MCG/ACT inhaler Inhale into the lungs every 4 (four) hours as needed for wheezing.     [provider]  ?Blood Glucose Monitoring Suppl (GLUCOCOM BLOOD GLUCOSE MONITOR) DEVI 1 each by XX route as directed One touch verio reflect meter    [provider]  ?Blood Glucose Monitoring Suppl (ONE TOUCH ULTRA 2) w/Device KIT  11/21/18   [provider]  ?Blood Glucose Monitoring Suppl (ONETOUCH VERIO REFLECT) w/Device KIT USE AS DIRECTED THREE TIMES A DAY FOR CBG MONITORING 05/14/20   [provider]  ?cetirizine (ZYRTEC) 10 MG tablet Take 2 tablets (20 mg total) by mouth 2 (two) times daily. 10/11/21 11/10/21  Pokhrel, Corrie Mckusick, MD  ?cyanocobalamin (,VITAMIN B-12,) 1000 MCG/ML injection Inject 1,000 mcg into the muscle every 14 (fourteen) days.     [provider]  ?EPINEPHrine (EPIPEN 2-PAK) 0.3 mg/0.3 mL IJ SOAJ injection Inject 0.3 mLs (0.3 mg total) into the muscle once as needed for  up to 1 dose (for severe allergic reaction). CAll 911 immediately if you have to use this medicine 01/23/20   Margarita Mail, PA-C  ?EPINEPHrine 0.3 mg/0.3 mL IJ SOAJ injection Inject 0.3 mg into the muscle as needed for anaphylaxis. 10/07/21   Sponseller, Gypsy Balsam, PA-C  ?famotidine (PEPCID) 20 MG tablet Take 1 tablet (20 mg total) by mouth 2 (two) times daily. 10/11/21 11/10/21  Pokhrel, Corrie Mckusick, MD  ?MAGNESIUM MALATE PO Take 4 tablets by mouth at bedtime.    [provider]  ?methylphenidate 54 MG PO CR tablet Take 1 tablet (54 mg total) by mouth every morning. 01/11/19   Deboraha Sprang, MD  ?[facility] VERIO test strip 1 each 3 (three) times daily. 07/17/20   [provider]  ?predniSONE (DELTASONE) 10 MG tablet Take 6 tablets by mouth daily for 2 days, then 5 tablets for 2 days, then 4 tablets for 2 days, then 3 tablets for 2 days, then 2 tablets for 2 days, then 1 tablet for 2 days. 10/18/21   Lucrezia Starch, MD  ?PRESCRIPTION MEDICATION Take 2 tablets by mouth as needed (anaphylactic shock/allergic reaction). Atarax made by custom care pharmacy without polysorbate    [provider]  ?Probiotic Product (PROBIOTIC PO) Take 1 capsule by mouth daily.    [provider]  ?QUERCETIN PO Take 1,000 mg by mouth daily.    [provider]  ?Vitamin D, Ergocalciferol, (DRISDOL) 1.25 MG (50000 UT) CAPS capsule Take 50,000 Units by mouth every 7 (seven) days.    [provider]  ?   ? ?Allergies    ?Cinnamon, Crab extract allergy skin test, Eggs or egg-derived products, Influenza virus vaccine, Levaquin [levofloxacin], Pineapple, Polysorbate, Pork-derived products, Sulfa antibiotics, Chlorhexidine, Amoxicillin-pot clavulanate, and Sulfamethoxazole   ? ?Review of Systems   ?Review of Systems ?See above ? ?Physical Exam ?Updated Vital Signs ?BP 106/60   Pulse 82   Temp 98.1 ?F (36.7 ?C) (Oral)   Resp 17   Ht _0  (1.778 m)   Wt 120.2 kg   SpO2 100%   BMI 38.02 kg/m?  ?Physical Exam ?Vitals and nursing note reviewed.  ?Constitutional:   ?   General: She is not in acute distress. ?   Appearance: She is well-developed. She is not diaphoretic.  ?HENT:  ?   Head: Normocephalic and atraumatic.  ?Eyes:  ?   Conjunctiva/sclera: Conjunctivae normal.  ?Cardiovascular:  ?   Rate and Rhythm: Normal rate and regular rhythm.  ?   Heart sounds: Normal heart sounds. No murmur heard. ?  No friction rub. No gallop.  ?Pulmonary:  ?   Effort:  Pulmonary effort is normal. No respiratory distress.  ?   Breath sounds: Normal breath sounds. No wheezing or rales.  ?Abdominal:  ?   General: There is no distension.  ?   Palpations: Abdomen is soft.  ?   Tenderness: There is no abdominal tenderness. There is no guarding.  ?Musculoskeletal:     ?   General: No tenderness.  ?   Cervical back: Normal range of motion.  ?Skin: ?   General: Skin is warm and dry.  ?   Findings: No erythema or rash.  ?Neurological:  ?   Mental Status: She is alert and oriented to person, place, and time.  ? ? ?ED Results / Procedures / Treatments   ?Labs ?(all labs ordered are listed, but only abnormal results are displayed) ?Labs Reviewed  ?COMPREHENSIVE METABOLIC PANEL - Abnormal; Notable  for the following components:  ?    Result Value  ? Glucose, Bld 106 (*)   ? Calcium 8.8 (*)   ? Total Protein 6.1 (*)   ? Alkaline Phosphatase 31 (*)   ? All other components within normal limits  ?CBC WITH DIFFERENTIAL/PLATELET  ?MAGNESIUM  ?URINALYSIS, ROUTINE W REFLEX MICROSCOPIC  ?LACTIC ACID, PLASMA  ?TROPONIN I (HIGH SENSITIVITY)  ? ? ?EKG ?EKG Interpretation ? ?Date/Time:  Tuesday December 17 2021 10:29:33 EDT ?Ventricular Rate:  104 ?PR Interval:  152 ?QRS Duration: 84 ?QT Interval:  334 ?QTC Calculation: 439 ?R Axis:   6 ?Text Interpretation: Atrial-paced rhythm Cannot rule out Anterior infarct , age undetermined Abnormal ECG When compared with ECG of 07-Oct-2021 18:02, No significant change since last tracing Confirmed by Gareth Morgan [phone]) on 12/17/2021 10:32:41 AM ? ?Radiology ?No results found. ? ?Procedures ?Procedures  ? ? ?Medications Ordered in ED ?Medications - No data to display ? ?ED Course/ Medical Decision Making/ A&P ?  ?                        ?Medical Decision Making ?Amount and/or Complexity of Data Reviewed ?Labs: ordered. ? ?Risk ?Prescription drug management. ? ? ?41yo female with history of hypertension, dysautonomia, neurocardiogenic syncope with pacemaker in  place,chronic idiopathic urticaria, atopic asthma who presents with concern for about one week of constant sensation of near-syncope. ? ?EKG was evaluated by me and showed no acute findings. ? ?Urinalysis was completed sh

## 2021-12-17 NOTE — ED Triage Notes (Signed)
States pacemaker hasnt transmitted in over 21 days. States feels near syncope x 1 week. Denies chest pain, has palpitations and BP irregularity. Biotronic pacemaker. ?

## 2022-01-10 ENCOUNTER — Encounter: Payer: Self-pay | Admitting: Internal Medicine

## 2022-01-10 ENCOUNTER — Ambulatory Visit (INDEPENDENT_AMBULATORY_CARE_PROVIDER_SITE_OTHER): Payer: 59 | Admitting: Internal Medicine

## 2022-01-10 VITALS — BP 110/88 | HR 99 | Ht 70.5 in | Wt 294.0 lb

## 2022-01-10 DIAGNOSIS — R002 Palpitations: Secondary | ICD-10-CM

## 2022-01-10 DIAGNOSIS — Z950 Presence of cardiac pacemaker: Secondary | ICD-10-CM

## 2022-01-10 DIAGNOSIS — R55 Syncope and collapse: Secondary | ICD-10-CM | POA: Diagnosis not present

## 2022-01-10 DIAGNOSIS — I4729 Other ventricular tachycardia: Secondary | ICD-10-CM

## 2022-01-10 NOTE — Patient Instructions (Signed)
Medication Instructions:  ?Your physician recommends that you continue on your current medications as directed. Please refer to the Current Medication list given to you today. ? ?Hardcopy Rx for Methylphenidate CT '54mg'$  - 1 tablet by mouth daily #30 with 0RF provided for the months of 05/23, 06/23, and 07/23. ? ?*If you need a refill on your cardiac medications before your next appointment, please call your pharmacy* ? ? ?Lab Work: ?None ordered. ? ?If you have labs (blood work) drawn today and your tests are completely normal, you will receive your results only by: ?MyChart Message (if you have MyChart) OR ?A paper copy in the mail ?If you have any lab test that is abnormal or we need to change your treatment, we will call you to review the results. ? ? ?Testing/Procedures: ?None ordered. ? ? ? ?Follow-Up: ?At [facility], you and your health needs are our priority.  As part of our continuing mission to provide you with exceptional heart care, we have created designated Provider Care Teams.  These Care Teams include your primary Cardiologist (physician) and Advanced Practice Providers (APPs -  Physician Assistants and Nurse Practitioners) who all work together to provide you with the care you need, when you need it. ? ?We recommend signing up for the patient portal called "MyChart".  Sign up information is provided on this After Visit Summary.  MyChart is used to connect with patients for Virtual Visits (Telemedicine).  Patients are able to view lab/test results, encounter notes, upcoming appointments, etc.  Non-urgent messages can be sent to your provider as well.   ?To learn more about what you can do with MyChart, go to [email or web address].   ? ?Your next appointment:   ?12 months with Dr Caryl Comes ? ?Important Information About Sugar ? ? ? ? ?  ?

## 2022-01-10 NOTE — Progress Notes (Signed)
? ? ? ? ?Patient Care Team: ?[name], MD as PCP - General (Family Medicine) ?Deboraha Sprang, MD as PCP - Electrophysiology (Cardiology) ?Harold Hedge, Darrick Grinder, MD as Consulting Physician (Allergy and Immunology) ?Kennith Center, RD as Dietitian (Family Medicine) ? ? ?HPI ? ?Erin Casey is a 41 y.o. female ?Seen in follow-up for a Biotronik CLS pacemaker([facility] with [facility]) implanted for symptomatic neurocardiogenic syncope. ? ?With this, she has significant improvement in her overall symptoms.   ?   ?Tachycardia.  Underwent EP testing-GT-noninducible ? ? ? ?She is here today as she was called because of an alert from October 14-15 associated with 13 HVR events for duration of greater than 1 minute. ?She has had some atrial high ventricular rate issues related to discordant reporting.  This was all found to be appropriate. ? 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 ? ?Struggling to exercise.  Has felt some tachy palpitations with heart rates recorded in the 200s; notably this is not present on her device. ? ?Some palpitations.  Not able to exercise much because of the steroids.  Has put on weight.  Some shortness of breath.  Has to fluid restrict because of peripheral edema. ? ?Her daughter is going to matriculate to [employer] ?  ? ?She has developed a left upper extremity DVT related to infusion of incompatible drugs apparently.   Had a short-term course of Xarelto ? ?Date Cr K Hgb  ?4/21 0.72 3.9 15.1  ? 3/23 0.73 4.1 15.0  ? ? ? ?  ? ?Past Medical History:  ?Diagnosis Date  ? Anemia   ? Depletion of volume of extracellular fluid   ? Dysautonomia ([place]) 2003  ? Essential hypertension 04/20/2013  ? GERD (gastroesophageal reflux disease)   ? Neurocardiogenic syncope   ? Neurocardiogenic syncope 2003  ? Neurologic cardiac syncope   ? Pacemaker-Biotronik 04/05/2012  ? Vitamin B12 deficiency 07/28/2018  ? ? ?Past Surgical  History:  ?Procedure Laterality Date  ? ABDOMINAL HYSTERECTOMY    ? CESAREAN SECTION    ? CHOLECYSTECTOMY    ? hysterectomy    ? LAPAROSCOPIC GASTRIC BANDING    ? With reversal   ? PACEMAKER INSERTION    ? PVC ABLATION N/A 03/12/2018  ? Procedure: PVC ABLATION;  Surgeon: Evans Lance, MD;  Location: [facility];  Service: Cardiovascular;  Laterality: N/A;  ? TONSILLECTOMY AND ADENOIDECTOMY    ? TUBAL LIGATION    ? ULNAR TUNNEL RELEASE Right 07/28/2019  ? Procedure: Right carpal tunnel release, right cubital tunnel release;  Surgeon: Verner Mould, MD;  Location: [facility];  Service: Orthopedics;  Laterality: Right;  ? ? ?Current Outpatient Medications  ?Medication Sig Dispense Refill  ? albuterol (PROVENTIL HFA;VENTOLIN HFA) 108 (90 Base) MCG/ACT inhaler Inhale into the lungs every 4 (four) hours as needed for wheezing.     ? Blood Glucose Monitoring Suppl (GLUCOCOM BLOOD GLUCOSE MONITOR) DEVI 1 each by XX route as directed One touch verio reflect meter    ? Blood Glucose Monitoring Suppl (ONE TOUCH ULTRA 2) w/Device KIT     ? Blood Glucose Monitoring Suppl (ONETOUCH VERIO REFLECT) w/Device KIT USE AS DIRECTED THREE TIMES A DAY FOR CBG MONITORING    ? budesonide-formoterol (SYMBICORT) 160-4.5 MCG/ACT inhaler Inhale 2 puffs into the lungs 2 (two) times daily.    ? cyanocobalamin (,VITAMIN B-12,) 1000 MCG/ML injection Inject 1,000 mcg into  the muscle every 14 (fourteen) days.     ? EPINEPHrine 0.3 mg/0.3 mL IJ SOAJ injection Inject 0.3 mg into the muscle as needed for anaphylaxis. 2 each 0  ? MAGNESIUM MALATE PO Take 4 tablets by mouth at bedtime.    ? methylphenidate 54 MG PO CR tablet Take 1 tablet (54 mg total) by mouth every morning. 90 tablet 0  ? omalizumab (XOLAIR) 150 MG injection Inject 300 mg into the skin every 14 (fourteen) days.    ? ONETOUCH VERIO test strip 1 each 3 (three) times daily.    ? predniSONE (DELTASONE) 10 MG tablet Take 6 tablets by mouth daily for 2 days, then 5 tablets for 2  days, then 4 tablets for 2 days, then 3 tablets for 2 days, then 2 tablets for 2 days, then 1 tablet for 2 days. 42 tablet 0  ? PRESCRIPTION MEDICATION Take 2 tablets by mouth as needed (anaphylactic shock/allergic reaction). Atarax made by custom care pharmacy without polysorbate    ? QUERCETIN PO Take 1,000 mg by mouth daily.    ? Vitamin D, Ergocalciferol, (DRISDOL) 1.25 MG (50000 UT) CAPS capsule Take 50,000 Units by mouth every 7 (seven) days.    ? cetirizine (ZYRTEC) 10 MG tablet Take 2 tablets (20 mg total) by mouth 2 (two) times daily. 120 tablet 1  ? famotidine (PEPCID) 20 MG tablet Take 1 tablet (20 mg total) by mouth 2 (two) times daily. 60 tablet 2  ? Probiotic Product (PROBIOTIC PO) Take 1 capsule by mouth daily.    ? ?No current facility-administered medications for this visit.  ? ? ?Allergies  ?Allergen Reactions  ? Cinnamon Anaphylaxis  ? Crab Extract Allergy Skin Test Anaphylaxis  ? Eggs Or Egg-Derived Products Anaphylaxis  ? Influenza Virus Vaccine Anaphylaxis  ? Levaquin [Levofloxacin] Anaphylaxis  ? Other Anaphylaxis  ?  Other reaction(s): Other (See Comments) ?Chlorahexdine cause blood clots  ? Pineapple Rash and Anaphylaxis  ?  Swelling, rash, GI upset ?Swelling, rash, GI upset ?Swelling, rash, GI upset  ? Polysorbate Anaphylaxis  ? Pork-Derived Products Anaphylaxis  ? Sulfa Antibiotics Hives, Nausea Only and Rash  ? Chlorhexidine Other (See Comments)  ?  States gets blood clots when used  ? Amoxicillin-Pot Clavulanate Other (See Comments)  ?  [Derm] [GI]  ? Sulfamethoxazole Nausea And Vomiting and Rash  ? ? ? ? ?Review of Systems negative except from HPI and PMH ? ?Physical Exam ?BP 110/88   Pulse 99   Ht 5' 10.5" (1.791 m)   Wt 294 lb (133.4 kg)   SpO2 98%   BMI 41.59 kg/m?  ?Well developed and Morbidly obese  in no acute distress ?HENT normal ?Neck supple with JVP-flat ?Clear ?Device pocket well healed; without hematoma or erythema.  There is no tethering  ?Regular rate and rhythm, no  murmur ?Abd-soft with active BS ?No Clubbing cyanosis  edema ?Skin-warm and dry ?A & Oriented  Grossly normal sensory and motor function ? ?ECG atrial pacing at 99 ?Intervals 13/09/33 ?Otherwise normal ? ?Assessment and  Plan ? ?SVT//VT- NS/PVCs noninducible at EP study 6/19 ? ?Pacemaker -Biotronik/CLS  ? ?Neurocardiogenic syncope/dysautonomia ? ?Mast cell activation disorder ? ?  ?Dysautonomia has been more problematic in the context of her mast cell activation disorder.  I told her that I think that I heard that there was an [place] program for this combination, and suggested that she speak to her immunologist at [facility] about this. ? ?Some ventricular high rates. ? ?We will  continued her device programming as it is. ? ?As relates to her dysautonomia, I suspect much of the problem is related to the mast cell activation issues hence the recommendation above. ? ?  ? ? ?  ? ?

## 2022-05-07 ENCOUNTER — Encounter: Payer: Self-pay | Admitting: Internal Medicine

## 2022-05-15 ENCOUNTER — Ambulatory Visit (INDEPENDENT_AMBULATORY_CARE_PROVIDER_SITE_OTHER): Payer: 59

## 2022-05-15 DIAGNOSIS — I4729 Other ventricular tachycardia: Secondary | ICD-10-CM | POA: Diagnosis not present

## 2022-05-15 DIAGNOSIS — Z950 Presence of cardiac pacemaker: Secondary | ICD-10-CM

## 2022-05-15 LAB — CUP PACEART REMOTE DEVICE CHECK
Battery Remaining Percentage: 35 %
Brady Statistic RA Percent Paced: 51 %
Brady Statistic RV Percent Paced: 0 %
Date Time Interrogation Session: 20230816072941
Implantable Lead Implant Date: 20130708
Implantable Lead Implant Date: 20130708
Implantable Lead Location: 753859
Implantable Lead Location: 753860
Implantable Lead Model: 350
Implantable Lead Model: 350
Implantable Lead Serial Number: 29222139
Implantable Lead Serial Number: 29231638
Implantable Pulse Generator Implant Date: 20130708
Lead Channel Impedance Value: 488 Ohm
Lead Channel Impedance Value: 507 Ohm
Lead Channel Impedance Value: 507 Ohm
Lead Channel Sensing Intrinsic Amplitude: 2.1 mV
Lead Channel Sensing Intrinsic Amplitude: 9.4 mV
Lead Channel Setting Pacing Amplitude: 2 V
Lead Channel Setting Pacing Amplitude: 2.4 V
Lead Channel Setting Pacing Pulse Width: 0.4 ms
Pulse Gen Serial Number: 66303845

## 2022-06-10 NOTE — Progress Notes (Signed)
Remote pacemaker transmission.   

## 2022-07-07 ENCOUNTER — Telehealth: Payer: Self-pay

## 2022-07-07 NOTE — Telephone Encounter (Signed)
Received the following transmission alert from Biotronik:  Number of high ven. rate episodes (HVR) per day intermittently above limit (> 5) Intermittently above limit since Jul 05, 2022 1:05:00 AM - Last value 9 episode(s) per day reported on Jul 07, 2022 1:05:00 AM  Called to check on patient.  States she had a severe allergic reaction this weekend with angioedema.  She was treated and is now on prednisone to take for the reaction.  All likely contributing to the elevated HVR.  Patient is doing well otherwise and will call us if any concerns.  Will note in Biotronik that elevations may continue acutely due to prednisone use.  Ventricular rates are controlled.

## 2022-08-07 ENCOUNTER — Encounter: Payer: Self-pay | Admitting: Internal Medicine

## 2022-08-07 NOTE — Telephone Encounter (Signed)
Pt made aware that Dr Caryl Comes and his nurse are not in the office today. Pt has only couple days left of medication. Aware I will forward this to Rosann Auerbach to address when she returns to office tomorrow. Pt agreeable to plan.

## 2022-08-14 ENCOUNTER — Ambulatory Visit (INDEPENDENT_AMBULATORY_CARE_PROVIDER_SITE_OTHER): Payer: 59

## 2022-08-14 DIAGNOSIS — R55 Syncope and collapse: Secondary | ICD-10-CM | POA: Diagnosis not present

## 2022-08-14 LAB — CUP PACEART REMOTE DEVICE CHECK
Battery Voltage: 35
Date Time Interrogation Session: 20231116080820
Implantable Lead Connection Status: 753985
Implantable Lead Connection Status: 753985
Implantable Lead Implant Date: 20130708
Implantable Lead Implant Date: 20130708
Implantable Lead Location: 753859
Implantable Lead Location: 753860
Implantable Lead Model: 350
Implantable Lead Model: 350
Implantable Lead Serial Number: 29222139
Implantable Lead Serial Number: 29231638
Implantable Pulse Generator Implant Date: 20130708
Pulse Gen Serial Number: 66303845

## 2022-09-03 NOTE — Progress Notes (Signed)
Remote pacemaker transmission.   

## 2022-10-19 ENCOUNTER — Encounter: Payer: Self-pay | Admitting: Internal Medicine

## 2022-11-13 ENCOUNTER — Ambulatory Visit (INDEPENDENT_AMBULATORY_CARE_PROVIDER_SITE_OTHER): Payer: 59

## 2022-11-13 DIAGNOSIS — R55 Syncope and collapse: Secondary | ICD-10-CM

## 2022-11-13 LAB — CUP PACEART REMOTE DEVICE CHECK
Battery Remaining Percentage: 35 %
Brady Statistic RA Percent Paced: 52 %
Date Time Interrogation Session: 20240215081129
Implantable Lead Connection Status: 753985
Implantable Lead Connection Status: 753985
Implantable Lead Implant Date: 20130708
Implantable Lead Implant Date: 20130708
Implantable Lead Location: 753859
Implantable Lead Location: 753860
Implantable Lead Model: 350
Implantable Lead Model: 350
Implantable Lead Serial Number: 29222139
Implantable Lead Serial Number: 29231638
Implantable Pulse Generator Implant Date: 20130708
Lead Channel Impedance Value: 507 Ohm
Lead Channel Impedance Value: 585 Ohm
Lead Channel Pacing Threshold Amplitude: 1 V
Lead Channel Pacing Threshold Pulse Width: 0.4 ms
Lead Channel Sensing Intrinsic Amplitude: 2.9 mV
Lead Channel Sensing Intrinsic Amplitude: 9.1 mV
Lead Channel Setting Pacing Amplitude: 2 V
Lead Channel Setting Pacing Amplitude: 2.4 V
Lead Channel Setting Pacing Pulse Width: 0.4 ms
Pulse Gen Serial Number: 66303845

## 2022-12-10 NOTE — Progress Notes (Signed)
Remote pacemaker transmission.   

## 2022-12-15 ENCOUNTER — Telehealth: Payer: Self-pay

## 2022-12-15 NOTE — Telephone Encounter (Signed)
32 HVR events reported 12/15/2022 @ 1:05 AM.   Patient reports recent allergic reaction which she is now taking steroids for. Reports yesterday she increased her steriods to 20 mg. Tomorrow 12/16/2022 and Wednesday she decreases her dose to 10 mg and completes wednesday. Advised I will forward to Dr. Caryl Comes to advise. Patient aware to call if any symptoms arise.

## 2023-01-05 DIAGNOSIS — I493 Ventricular premature depolarization: Secondary | ICD-10-CM | POA: Insufficient documentation

## 2023-01-05 DIAGNOSIS — I471 Supraventricular tachycardia, unspecified: Secondary | ICD-10-CM | POA: Insufficient documentation

## 2023-01-05 DIAGNOSIS — G901 Familial dysautonomia [Riley-Day]: Secondary | ICD-10-CM | POA: Insufficient documentation

## 2023-01-08 ENCOUNTER — Ambulatory Visit: Payer: 59 | Attending: Internal Medicine | Admitting: Internal Medicine

## 2023-01-08 ENCOUNTER — Encounter: Payer: Self-pay | Admitting: Internal Medicine

## 2023-01-08 VITALS — BP 109/78 | HR 114 | Ht 70.5 in | Wt 288.8 lb

## 2023-01-08 DIAGNOSIS — I4729 Other ventricular tachycardia: Secondary | ICD-10-CM | POA: Diagnosis not present

## 2023-01-08 DIAGNOSIS — R55 Syncope and collapse: Secondary | ICD-10-CM

## 2023-01-08 DIAGNOSIS — Z950 Presence of cardiac pacemaker: Secondary | ICD-10-CM

## 2023-01-08 DIAGNOSIS — I493 Ventricular premature depolarization: Secondary | ICD-10-CM

## 2023-01-08 DIAGNOSIS — I471 Supraventricular tachycardia, unspecified: Secondary | ICD-10-CM

## 2023-01-08 DIAGNOSIS — G901 Familial dysautonomia [Riley-Day]: Secondary | ICD-10-CM

## 2023-01-08 NOTE — Patient Instructions (Signed)
Medication Instructions:  Your physician recommends that you continue on your current medications as directed. Please refer to the Current Medication list given to you today.  *If you need a refill on your cardiac medications before your next appointment, please call your pharmacy*   Lab Work: None ordered.  If you have labs (blood work) drawn today and your tests are completely normal, you will receive your results only by: MyChart Message (if you have MyChart) OR A paper copy in the mail If you have any lab test that is abnormal or we need to change your treatment, we will call you to review the results.   Testing/Procedures: None ordered.    Follow-Up: At [facility], you and your health needs are our priority.  As part of our continuing mission to provide you with exceptional heart care, we have created designated Provider Care Teams.  These Care Teams include your primary Cardiologist (physician) and Advanced Practice Providers (APPs -  Physician Assistants and Nurse Practitioners) who all work together to provide you with the care you need, when you need it.  We recommend signing up for the patient portal called "MyChart".  Sign up information is provided on this After Visit Summary.  MyChart is used to connect with patients for Virtual Visits (Telemedicine).  Patients are able to view lab/test results, encounter notes, upcoming appointments, etc.  Non-urgent messages can be sent to your provider as well.   To learn more about what you can do with MyChart, go to [email or web address].    Your next appointment:   12 months with Dr Klein 

## 2023-01-08 NOTE — Progress Notes (Signed)
Patient Care Team: Dois Davenport, MD as PCP - General (Family Medicine) Duke Salvia, MD as PCP - Electrophysiology (Cardiology) Irena Cords, Enzo Montgomery, MD as Consulting Physician (Allergy and Immunology) Linna Darner, RD as Dietitian (Family Medicine)   HPI  Erin Casey is a 42 y.o. female Seen in follow-up for a Biotronik CLS pacemaker([employer] with [employer]) implanted for symptomatic neurocardiogenic syncope.  With this, she has significant improvement in her overall symptoms.      Tachycardia.  Underwent EP testing-GT-noninducible    She is here today as she was called because of an alert from October 14-15 associated with 13 HVR events for duration of greater than 1 minute. She has had some atrial high ventricular rate issues related to discordant reporting.  This was all found to be appropriate.  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  She has been on prednisone more often than not she says in the last year  Aware of rapid heart rates.  Exercise tolerance not withstanding is pretty good.  No chest pain shortness of breath or edema   She has developed a left upper extremity DVT related to infusion of incompatible drugs apparently.   Had a short-term course of Xarelto  Date Cr K Hgb  4/21 0.72 3.9 15.1   3/23 0.73 4.1 15.0        Past Medical History:  Diagnosis Date   Anemia    Depletion of volume of extracellular fluid    Dysautonomia (HCC) 2003   Essential hypertension 04/20/2013   GERD (gastroesophageal reflux disease)    Neurocardiogenic syncope    Neurocardiogenic syncope 2003   Neurologic cardiac syncope    Pacemaker-Biotronik 04/05/2012   Vitamin B12 deficiency 07/28/2018    Past Surgical History:  Procedure Laterality Date   ABDOMINAL HYSTERECTOMY     CESAREAN SECTION     CHOLECYSTECTOMY     hysterectomy     LAPAROSCOPIC GASTRIC BANDING     With  reversal    PACEMAKER INSERTION     PVC ABLATION N/A 03/12/2018   Procedure: PVC ABLATION;  Surgeon: Marinus Maw, MD;  Location: [facility];  Service: Cardiovascular;  Laterality: N/A;   TONSILLECTOMY AND ADENOIDECTOMY     TUBAL LIGATION     ULNAR TUNNEL RELEASE Right 07/28/2019   Procedure: Right carpal tunnel release, right cubital tunnel release;  Surgeon: Ernest Mallick, MD;  Location: [facility] OR;  Service: Orthopedics;  Laterality: Right;    Current Outpatient Medications  Medication Sig Dispense Refill   budesonide-formoterol (SYMBICORT) 160-4.5 MCG/ACT inhaler Inhale 2 puffs into the lungs 2 (two) times daily.     cetirizine (ZYRTEC) 10 MG tablet Take 2 tablets (20 mg total) by mouth 2 (two) times daily. 120 tablet 1   cyanocobalamin (,VITAMIN B-12,) 1000 MCG/ML injection Inject 1,000 mcg into the muscle every 7 (seven) days.     EPINEPHrine 0.3 mg/0.3 mL IJ SOAJ injection Inject 0.3 mg into the muscle as needed for anaphylaxis. 2 each 0   famotidine (PEPCID) 20 MG tablet Take 1 tablet (20 mg total) by mouth 2 (two) times daily. 60 tablet 2   MAGNESIUM MALATE PO Take 4 tablets by mouth at bedtime.     methylphenidate 54 MG PO CR tablet Take 1 tablet (54 mg total) by mouth every morning. 90 tablet 0   omalizumab (XOLAIR) 150 MG injection Inject 300  mg into the skin every 14 (fourteen) days.     PRESCRIPTION MEDICATION Take 2 tablets by mouth as needed (anaphylactic shock/allergic reaction). Atarax made by [facility] without polysorbate     No current facility-administered medications for this visit.    Allergies  Allergen Reactions   Cinnamon Anaphylaxis   Crab Extract Anaphylaxis   Egg-Derived Products Anaphylaxis   Influenza Virus Vaccine Anaphylaxis   Levaquin [Levofloxacin] Anaphylaxis   Other Anaphylaxis    Other reaction(s): Other (See Comments) Chlorahexdine cause blood clots   Pineapple Rash and Anaphylaxis    Swelling, rash, GI  upset Swelling, rash, GI upset Swelling, rash, GI upset   Polysorbate Anaphylaxis   Pork-Derived Products Anaphylaxis   Sulfa Antibiotics Hives, Nausea Only and Rash   Chlorhexidine Other (See Comments)    States gets blood clots when used   Amoxicillin-Pot Clavulanate Other (See Comments)    [Derm] [GI]   Sulfamethoxazole Nausea And Vomiting and Rash      Review of Systems negative except from HPI and PMH  Physical Exam BP 103/68 (Patient Position: Standing)   Pulse (!) 104   Wt 288 lb 12.8 oz (131 kg)   BMI 40.85 kg/m  Well developed and nourished in no acute distress HENT normal Neck supple with JVP-  flat  Clear Device pocket well healed; without hematoma or erythema.  There is no tethering  Regular rate and rhythm, no murmurs or gallops Abd-soft with active BS No Clubbing cyanosis edema Skin-warm and dry A & Oriented  Grossly normal sensory and motor function  ECG atrial pacing at 98 Intervals 13/09/35    Device function is normal. Programming changes none  See Paceart for details   Mild increasing trend of atrial impedance we will follow   Assessment and  Plan  SVT//VT- NS/PVCs noninducible at EP study 6/19  Sinus tachycardia  Pacemaker -Biotronik/CLS   Neurocardiogenic syncope/dysautonomia  Mast cell activation disorder  Adrenal tumor  Dysautonomia is relatively well-controlled.  There is significant increase in heart rates seemingly, over the last year the heart rate trend has not been excessive, the average heart rate is in the 90s, it looks like it is lower at night surprisingly, but she is aware and others have been aware of this.  Beta-blockers or ivabradine make reasonable sense to consider to try to control her heart rate.  She has had some low blood pressure so the latter might be the preferred drug.  However, given the concerns of her mast cell activation disorder which has been life-threatening, I have asked the patient to reach out to her  physician at [facility] to get her thoughts as to which if either these drug classes would be preferable.  As relates to her adrenal tumor, I asked her to reach out to the endocrine oncologist at [facility] and asking the question as to whether there is any concern about secreting catecholamines

## 2023-01-13 ENCOUNTER — Encounter: Payer: Self-pay | Admitting: Internal Medicine

## 2023-02-12 ENCOUNTER — Ambulatory Visit (INDEPENDENT_AMBULATORY_CARE_PROVIDER_SITE_OTHER): Payer: 59

## 2023-02-12 DIAGNOSIS — I4729 Other ventricular tachycardia: Secondary | ICD-10-CM

## 2023-02-12 LAB — CUP PACEART REMOTE DEVICE CHECK
Date Time Interrogation Session: 20240516074355
Implantable Lead Connection Status: 753985
Implantable Lead Connection Status: 753985
Implantable Lead Implant Date: 20130708
Implantable Lead Implant Date: 20130708
Implantable Lead Location: 753859
Implantable Lead Location: 753860
Implantable Lead Model: 350
Implantable Lead Model: 350
Implantable Lead Serial Number: 29222139
Implantable Lead Serial Number: 29231638
Implantable Pulse Generator Implant Date: 20130708
Pulse Gen Serial Number: 66303845

## 2023-02-13 ENCOUNTER — Telehealth: Payer: Self-pay

## 2023-02-13 NOTE — Telephone Encounter (Signed)
Following alert received from [employer] received for Scheduled remote reviewed. Normal device function.  Up to 10 VHR episodes per day per trends, longest 1 min 40 sec, no EGMs available. Routed to triage for review. Up to 41 AHR episodes per day per trends, burden 0%, no EGMs available. Known ST/SVT per 01/08/23 office Visit in Epic.  Attempted to call patient for DC apt to review episodes. Bi[employer]ronik monitor unable to send manual transmission. No answer, LMTCB.

## 2023-02-16 NOTE — Telephone Encounter (Signed)
Attempted to call cell. No answer, LMTCB.

## 2023-02-17 ENCOUNTER — Ambulatory Visit: Payer: 59 | Attending: Internal Medicine

## 2023-02-17 DIAGNOSIS — I4729 Other ventricular tachycardia: Secondary | ICD-10-CM

## 2023-02-17 LAB — CUP PACEART INCLINIC DEVICE CHECK
Date Time Interrogation Session: 20240521161710
Implantable Lead Connection Status: 753985
Implantable Lead Connection Status: 753985
Implantable Lead Implant Date: 20130708
Implantable Lead Implant Date: 20130708
Implantable Lead Location: 753859
Implantable Lead Location: 753860
Implantable Lead Model: 350
Implantable Lead Model: 350
Implantable Lead Serial Number: 29222139
Implantable Lead Serial Number: 29231638
Implantable Pulse Generator Implant Date: 20130708
Pulse Gen Serial Number: 66303845

## 2023-02-17 NOTE — Progress Notes (Signed)
Patient seen in device clinic to retrieve events on pacemaker. 20 HVR/AT events logged. See attached report. Longest in duration 7 minutes 34 seconds. Patient reports she was playing golf during this time and asymptomatic.

## 2023-02-26 NOTE — Progress Notes (Signed)
Remote pacemaker transmission.   

## 2023-03-23 ENCOUNTER — Telehealth: Payer: Self-pay

## 2023-03-23 NOTE — Telephone Encounter (Signed)
Received alert from [employer]. Patient has had 16 HVR episodes, longest is 2 minutes.  No EGM, time period 6/22-6/23 time frame.    Spoke with patient.  She has not felt well this weekend.  She is titrating down on prednisone dosing and cut to 20mg  this weekend.  She was on prednisone for an allergic reaction.  Also, is having to limit fluids due to increased swelling from Prednisone.  Patient reports mild diarrhea this weekend as well.   No other medication changes.  She is better today and will contact our office if symptoms return.  We discussed fluid intake and need to have adequate hydration during heat. No other changes to diet, health or medications.   FYI to Dr. Graciela Husbands in case anything further.

## 2023-03-23 NOTE — Telephone Encounter (Signed)
Woud follow

## 2023-04-15 ENCOUNTER — Encounter: Payer: Self-pay | Admitting: Internal Medicine

## 2023-05-14 ENCOUNTER — Ambulatory Visit (INDEPENDENT_AMBULATORY_CARE_PROVIDER_SITE_OTHER): Payer: 59

## 2023-05-14 DIAGNOSIS — I4729 Other ventricular tachycardia: Secondary | ICD-10-CM

## 2023-05-14 LAB — CUP PACEART REMOTE DEVICE CHECK
Battery Voltage: 35
Date Time Interrogation Session: 20240815075440
Implantable Lead Connection Status: 753985
Implantable Lead Connection Status: 753985
Implantable Lead Implant Date: 20130708
Implantable Lead Implant Date: 20130708
Implantable Lead Location: 753859
Implantable Lead Location: 753860
Implantable Lead Model: 350
Implantable Lead Model: 350
Implantable Lead Serial Number: 29222139
Implantable Lead Serial Number: 29231638
Implantable Pulse Generator Implant Date: 20130708
Pulse Gen Serial Number: 66303845

## 2023-05-27 ENCOUNTER — Telehealth: Payer: Self-pay

## 2023-05-27 NOTE — Telephone Encounter (Addendum)
Increase in daily HVR episodes @ 31. Pt reports feeling good today. Was fatigued the previous two days. Will continue to monitor.

## 2023-05-27 NOTE — Progress Notes (Signed)
Remote pacemaker transmission.   

## 2023-06-04 ENCOUNTER — Other Ambulatory Visit: Payer: Self-pay | Admitting: Oncology

## 2023-06-04 DIAGNOSIS — Z006 Encounter for examination for normal comparison and control in clinical research program: Secondary | ICD-10-CM

## 2023-06-14 ENCOUNTER — Encounter: Payer: Self-pay | Admitting: Internal Medicine

## 2023-06-15 NOTE — Telephone Encounter (Signed)
Spoke with [facility] pharmacist and advised Concerta Rx was written for 8/19 but was filled on 8/15.  Pharmacist states RN will need to re-write the methylphenidate RX with today's date for this month and next month.  Pharmacist will provide prvious Rx back previous Rx to pt for exchange of the new Rx.

## 2023-06-15 NOTE — Telephone Encounter (Signed)
Pt walked in and presented previous Methylphenidate Rx's for 06/18/23 and 07/18/23.  Those Rx's were placed in the shred bin and Rx for Methylphenidate CR 54mg  - 1 tablet by mouth daily #30 with 0 RF's for 06/15/23 and 07/15/23 provided to pt.  Pt was grateful for the assistance.

## 2023-06-15 NOTE — Telephone Encounter (Signed)
Spoke with pt and advised pt will need to pick up previous Methylphenidate Rx as [facility] filled Rx prior to date written on the Rx of 05/18/2023 and bring to [place] office.  RN will write new Rx with 06/15/2023 and 07/15/2023 date on them.  Pt verbalizes understanding and agrees with current plan.

## 2023-07-14 ENCOUNTER — Telehealth: Payer: Self-pay

## 2023-07-14 NOTE — Telephone Encounter (Signed)
No messages received for at least 21 days Last message received 22 days ago. The patient will be deactivated in 68 days.

## 2023-07-14 NOTE — Telephone Encounter (Signed)
I spoke with the patient and she is going to call [employer] tech support when she gets home.   She states she feels SOB when she exercise. After speaking with Boneta Lucks she agreed to see Dr. Graciela Husbands to see if her recommend if she needs any programming changes. We told her we will send the scheduler a phone note to get her on the schedule with Graciela Husbands.

## 2023-07-15 ENCOUNTER — Encounter: Payer: Self-pay | Admitting: Internal Medicine

## 2023-07-27 ENCOUNTER — Telehealth: Payer: Self-pay

## 2023-07-27 NOTE — Telephone Encounter (Signed)
[employer] alert received for  HVR alert. Patient reports she started back playing tennis yesterday which lasted 2 hours. Patient advised to stay well hydrated and work herself up to playing in shorter durations. Pt voiced understanding and states she will call back if she has any further questions or concerns. Will continue to monitor.   Pt plays tennis every Sunday from 2:30-4:30 pm.

## 2023-07-27 NOTE — Telephone Encounter (Signed)
Erroneous encounter

## 2023-07-31 ENCOUNTER — Other Ambulatory Visit (HOSPITAL_COMMUNITY): Payer: 59

## 2023-08-13 ENCOUNTER — Ambulatory Visit (INDEPENDENT_AMBULATORY_CARE_PROVIDER_SITE_OTHER): Payer: 59

## 2023-08-13 DIAGNOSIS — I4729 Other ventricular tachycardia: Secondary | ICD-10-CM

## 2023-08-15 LAB — CUP PACEART REMOTE DEVICE CHECK
Battery Remaining Percentage: 35 %
Brady Statistic RA Percent Paced: 54 %
Brady Statistic RV Percent Paced: 0 %
Date Time Interrogation Session: 20241114075956
Implantable Lead Connection Status: 753985
Implantable Lead Connection Status: 753985
Implantable Lead Implant Date: 20130708
Implantable Lead Implant Date: 20130708
Implantable Lead Location: 753859
Implantable Lead Location: 753860
Implantable Lead Model: 350
Implantable Lead Model: 350
Implantable Lead Serial Number: 29222139
Implantable Lead Serial Number: 29231638
Implantable Pulse Generator Implant Date: 20130708
Lead Channel Impedance Value: 507 Ohm
Lead Channel Impedance Value: 566 Ohm
Lead Channel Pacing Threshold Amplitude: 1 V
Lead Channel Pacing Threshold Pulse Width: 0.4 ms
Lead Channel Sensing Intrinsic Amplitude: 2.4 mV
Lead Channel Sensing Intrinsic Amplitude: 9.2 mV
Lead Channel Setting Pacing Amplitude: 2 V
Lead Channel Setting Pacing Amplitude: 2.4 V
Lead Channel Setting Pacing Pulse Width: 0.4 ms
Pulse Gen Serial Number: 66303845

## 2023-08-20 ENCOUNTER — Other Ambulatory Visit: Payer: Self-pay

## 2023-08-20 ENCOUNTER — Emergency Department (HOSPITAL_BASED_OUTPATIENT_CLINIC_OR_DEPARTMENT_OTHER)
Admission: EM | Admit: 2023-08-20 | Discharge: 2023-08-21 | Disposition: A | Discharge status: Home / Self Care | Payer: 59 | Attending: Emergency Medicine | Admitting: Emergency Medicine

## 2023-08-20 ENCOUNTER — Emergency Department (HOSPITAL_BASED_OUTPATIENT_CLINIC_OR_DEPARTMENT_OTHER): Payer: 59 | Admitting: Radiology

## 2023-08-20 ENCOUNTER — Emergency Department (HOSPITAL_BASED_OUTPATIENT_CLINIC_OR_DEPARTMENT_OTHER): Payer: 59

## 2023-08-20 DIAGNOSIS — M7989 Other specified soft tissue disorders: Secondary | ICD-10-CM | POA: Insufficient documentation

## 2023-08-20 LAB — CBC
HCT: 46.6 % — ABNORMAL HIGH (ref 36.0–46.0)
Hemoglobin: 15.9 g/dL — ABNORMAL HIGH (ref 12.0–15.0)
MCH: 31.1 pg (ref 26.0–34.0)
MCHC: 34.1 g/dL (ref 30.0–36.0)
MCV: 91.2 fL (ref 80.0–100.0)
Platelets: 195 10*3/uL (ref 150–400)
RBC: 5.11 MIL/uL (ref 3.87–5.11)
RDW: 12.4 % (ref 11.5–15.5)
WBC: 7.6 10*3/uL (ref 4.0–10.5)
nRBC: 0 % (ref 0.0–0.2)

## 2023-08-20 LAB — COMPREHENSIVE METABOLIC PANEL
ALT: 20 U/L (ref 0–44)
AST: 17 U/L (ref 15–41)
Albumin: 4.1 g/dL (ref 3.5–5.0)
Alkaline Phosphatase: 33 U/L — ABNORMAL LOW (ref 38–126)
Anion gap: 7 (ref 5–15)
BUN: 10 mg/dL (ref 6–20)
CO2: 27 mmol/L (ref 22–32)
Calcium: 9.3 mg/dL (ref 8.9–10.3)
Chloride: 103 mmol/L (ref 98–111)
Creatinine, Ser: 0.71 mg/dL (ref 0.44–1.00)
GFR, Estimated: >60 mL/min (ref >60)
Glucose, Bld: 87 mg/dL (ref 70–99)
Potassium: 3.8 mmol/L (ref 3.5–5.1)
Sodium: 137 mmol/L (ref 135–145)
Total Bilirubin: 0.5 mg/dL (ref <1.2)
Total Protein: 6.7 g/dL (ref 6.5–8.1)

## 2023-08-20 LAB — TROPONIN I (HIGH SENSITIVITY)
Troponin I (High Sensitivity): 2 ng/L (ref <18)
Troponin I (High Sensitivity): 2 ng/L (ref <18)

## 2023-08-20 LAB — BRAIN NATRIURETIC PEPTIDE: B Natriuretic Peptide: 30.6 pg/mL (ref 0.0–100.0)

## 2023-08-20 MED ORDER — FAMOTIDINE 20 MG PO TABS
20.0000 mg | ORAL_TABLET | Freq: Once | ORAL | Status: AC
  Administered 2023-08-20: 20 mg via ORAL
  Filled 2023-08-20: qty 1

## 2023-08-20 MED ORDER — FUROSEMIDE 20 MG PO TABS: 20.0000 mg | ORAL_TABLET | Freq: Every day | ORAL | 0 refills | Status: DC

## 2023-08-20 MED ORDER — CETIRIZINE HCL 5 MG/5ML PO SOLN
1.0000 mg | Freq: Once | ORAL | Status: AC
  Administered 2023-08-20: 1 mg via ORAL
  Filled 2023-08-20: qty 5

## 2023-08-20 MED ORDER — SODIUM CHLORIDE 0.9 % IV SOLN: INTRAVENOUS | Status: DC

## 2023-08-20 MED ORDER — ACETAMINOPHEN 500 MG PO TABS
1000.0000 mg | ORAL_TABLET | Freq: Once | ORAL | Status: AC
  Administered 2023-08-20: 1000 mg via ORAL
  Filled 2023-08-20: qty 2

## 2023-08-20 MED ORDER — MOMETASONE FURO-FORMOTEROL FUM 200-5 MCG/ACT IN AERO: 2.0000 | INHALATION_SPRAY | Freq: Two times a day (BID) | RESPIRATORY_TRACT | Status: DC

## 2023-08-20 MED ORDER — IOHEXOL 350 MG/ML SOLN
100.0000 mL | Freq: Once | INTRAVENOUS | Status: AC | PRN
  Administered 2023-08-20: 75 mL via INTRAVENOUS

## 2023-08-20 NOTE — ED Provider Notes (Signed)
[place] EMERGENCY DEPARTMENT AT [facility] Provider Note   CSN: [id number] Arrival date & time: 08/20/23  1951     History  Chief Complaint  Patient presents with   Leg Swelling    Erin Casey is a 42 y.o. female.  HPI   42 year old female presents emergency department with complaint of left leg swelling, shortness of breath, chest tightness.  Patient states that symptoms began 2 hours prior to arrival.  Reports history of DVT but not currently anticoagulated.  Describes chest pain as central chest tightness worsened with taking a deep breath.  Denies any fever, cough, abdominal pain, nausea, vomiting, trauma to leg.  Past medical history significant for dysautonomia, neurocardiogenic syncope, hypertension, mast cell activation syndrome  Home Medications Prior to Admission medications   Medication Sig Start Date End Date Taking? Authorizing Provider  furosemide (LASIX) 20 MG tablet Take 1 tablet (20 mg total) by mouth daily. 08/20/23  Yes Sherian Maroon A, PA  Azelastine HCl 137 MCG/SPRAY SOLN Place 2 sprays into both nostrils 2 (two) times daily.    [provider]  budesonide-formoterol (SYMBICORT) 160-4.5 MCG/ACT inhaler Inhale 2 puffs into the lungs 2 (two) times daily.    [provider]  cetirizine (ZYRTEC) 10 MG tablet Take 2 tablets (20 mg total) by mouth 2 (two) times daily. 10/11/21 01/08/23  Pokhrel, Rebekah Chesterfield, MD  cyanocobalamin (,VITAMIN B-12,) 1000 MCG/ML injection Inject 1,000 mcg into the muscle every 7 (seven) days.    [provider]  doxycycline (VIBRA-TABS) 100 MG tablet Take 100 mg by mouth daily. As needed 12/12/22   [provider]  EPINEPHrine 0.3 mg/0.3 mL IJ SOAJ injection Inject 0.3 mg into the muscle as needed for anaphylaxis. 10/07/21   Sponseller, Eugene Gavia, PA-C  famotidine (PEPCID) 20 MG tablet Take 1 tablet (20 mg total) by mouth 2 (two) times daily. 10/11/21 11/10/21  Pokhrel, Rebekah Chesterfield, MD  hydrOXYzine  (ATARAX) 50 MG tablet Take 50 mg by mouth as needed. 11/21/21   [provider]  MAGNESIUM MALATE PO Take 4 tablets by mouth at bedtime.    [provider]  methylphenidate 54 MG PO CR tablet Take 1 tablet (54 mg total) by mouth every morning. 01/11/19   Duke Salvia, MD  METRONIDAZOLE, TOPICAL, 0.75 % LOTN 1 Application 2 (two) times daily. 07/08/22   [provider]  montelukast (SINGULAIR) 10 MG tablet Take 10 mg by mouth at bedtime. 07/08/22   [provider]  omalizumab Geoffry Paradise) 150 MG injection Inject 300 mg into the skin every 14 (fourteen) days.    [provider]  Prenatal 27-1 MG TABS Take 1 tablet by mouth daily. 09/23/22   [provider]  PRESCRIPTION MEDICATION Take 2 tablets by mouth as needed (anaphylactic shock/allergic reaction). Atarax [facility] by [facility] without polysorbate    [provider]      Allergies    Cinnamon, Crab extract, Egg-derived products, Influenza virus vaccine, Levaquin [levofloxacin], Other, Pineapple, Polysorbate, Pork-derived products, Sulfa antibiotics, Chlorhexidine, Amoxicillin-pot clavulanate, and Sulfamethoxazole    Review of Systems   Review of Systems  All other systems reviewed and are negative.   Physical Exam Updated Vital Signs BP 132/85 (BP Location: Right Arm)   Pulse 81   Temp 97.6 F (36.4 C)   Resp 18   Ht 5' 10.5" (1.791 m)   Wt 117.9 kg   SpO2 98%   BMI 36.78 kg/m  Physical Exam Vitals and nursing note reviewed.  Constitutional:  General: She is not in acute distress.    Appearance: She is well-developed.  HENT:     Head: Normocephalic and atraumatic.  Eyes:     Conjunctiva/sclera: Conjunctivae normal.  Cardiovascular:     Rate and Rhythm: Normal rate and regular rhythm.     Pulses: Normal pulses.     Heart sounds: No murmur heard. Pulmonary:     Effort: Pulmonary effort is normal. No respiratory distress.     Breath sounds: Normal  breath sounds. No wheezing, rhonchi or rales.  Abdominal:     Palpations: Abdomen is soft.     Tenderness: There is no abdominal tenderness. There is no guarding.  Musculoskeletal:     Cervical back: Neck supple.     Comments: 1+ pitting edema left lower extremity with compared to right.  No overlying tenderness of left lower extremity.  Full range of motion of bilateral hips, knees, ankles, digits.  Pedal pulses 2+ bilaterally.  Skin:    General: Skin is warm and dry.     Capillary Refill: Capillary refill takes less than 2 seconds.  Neurological:     Mental Status: She is alert.  Psychiatric:        Mood and Affect: Mood normal.     ED Results / Procedures / Treatments   Labs (all labs ordered are listed, but only abnormal results are displayed) Labs Reviewed  CBC - Abnormal; Notable for the following components:      Result Value   Hemoglobin 15.9 (*)    HCT 46.6 (*)    All other components within normal limits  COMPREHENSIVE METABOLIC PANEL - Abnormal; Notable for the following components:   Alkaline Phosphatase 33 (*)    All other components within normal limits  BRAIN NATRIURETIC PEPTIDE  TROPONIN I (HIGH SENSITIVITY)  TROPONIN I (HIGH SENSITIVITY)    EKG None  Radiology CT Angio Chest PE W/Cm &/Or Wo Cm  Result Date: 08/20/2023 CLINICAL DATA:  Pulmonary embolism suspected, high probability. Left leg swelling and some shortness of breath. History of blood clots. EXAM: CT ANGIOGRAPHY CHEST WITH CONTRAST TECHNIQUE: Multidetector CT imaging of the chest was performed using the standard protocol during bolus administration of intravenous contrast. Multiplanar CT image reconstructions and MIPs were obtained to evaluate the vascular anatomy. RADIATION DOSE REDUCTION: This exam was performed according to the departmental dose-optimization program which includes automated exposure control, adjustment of the mA and/or kV according to patient size and/or use of iterative  reconstruction technique. CONTRAST:  75mL OMNIPAQUE IOHEXOL 350 MG/ML SOLN COMPARISON:  05/23/2021. FINDINGS: Cardiovascular: The heart is normal in size and there is no pericardial effusion. Pacemaker leads are noted in the heart. The aorta and pulmonary trunk are normal in caliber. No large central pulmonary embolus. Evaluation of the of the pulmonary arteries is limited due to mixing artifact. Mediastinum/Nodes: No enlarged mediastinal, hilar, or axillary lymph nodes. Thyroid gland, trachea, and esophagus demonstrate no significant findings. Lungs/Pleura: Lungs are clear. No pleural effusion or pneumothorax. Upper Abdomen: A 1.7 cm left adrenal nodule is noted with attenuation of 34 Hounsfield units. The right adrenal gland is within normal limits. The gallbladder is surgically absent. No acute abnormality. Musculoskeletal: Pacemaker device is present in the anterior left chest wall. No acute osseous abnormality. Review of the MIP images confirms the above findings. IMPRESSION: 1. No large central pulmonary embolus. Evaluation of the pulmonary arteries is limited due to mixing artifact. 2. No acute process in the chest. 3. 1.7 cm left adrenal nodule,  probable benign adenoma. One year follow-up adrenal washout CT is recommended. If stable for 1 year, no further follow-up imaging. Electronically Signed   By: Thornell Sartorius M.D.   On: 08/20/2023 23:06   DG Chest 2 View  Result Date: 08/20/2023 CLINICAL DATA:  Chest pain.  Left leg swelling. EXAM: CHEST - 2 VIEW COMPARISON:  10/07/2021 FINDINGS: Left-sided pacemaker in place. Low lung volumes. The cardiomediastinal contours are normal. Pulmonary vasculature is normal. No consolidation, pleural effusion, or pneumothorax. No acute osseous abnormalities are seen. IMPRESSION: Low lung volumes without acute chest finding. Electronically Signed   By: Narda Rutherford M.D.   On: 08/20/2023 21:37   US Venous Img Lower  Left (DVT Study)  Result Date:  08/20/2023 CLINICAL DATA:  Left lower extremity edema. EXAM: Left LOWER EXTREMITY VENOUS DOPPLER ULTRASOUND TECHNIQUE: Gray-scale sonography with compression, as well as color and duplex ultrasound, were performed to evaluate the deep venous system(s) from the level of the common femoral vein through the popliteal and proximal calf veins. COMPARISON:  Ultrasound dated 07/18/2018. FINDINGS: VENOUS Normal compressibility of the common femoral, superficial femoral, and popliteal veins, as well as the visualized calf veins. Visualized portions of profunda femoral vein and great saphenous vein unremarkable. No filling defects to suggest DVT on grayscale or color Doppler imaging. Doppler waveforms show normal direction of venous flow, normal respiratory plasticity and response to augmentation. Limited views of the contralateral common femoral vein are unremarkable. OTHER None. Limitations: none IMPRESSION: Negative. Electronically Signed   By: Elgie Collard M.D.   On: 08/20/2023 21:11    Procedures Procedures    Medications Ordered in ED Medications  mometasone-formoterol (DULERA) 200-5 MCG/ACT inhaler 2 puff (has no administration in time range)  0.9 %  sodium chloride infusion ( Intravenous New Bag/Given 08/20/23 2211)  cetirizine HCl (Zyrtec) 5 MG/5ML solution 1 mg (1 mg Oral Given 08/20/23 2212)  famotidine (PEPCID) tablet 20 mg (20 mg Oral Given 08/20/23 2212)  acetaminophen (TYLENOL) tablet 1,000 mg (1,000 mg Oral Given 08/20/23 2212)  iohexol (OMNIPAQUE) 350 MG/ML injection 100 mL (75 mLs Intravenous Contrast Given 08/20/23 2235)    ED Course/ Medical Decision Making/ A&P                                 Medical Decision Making Amount and/or Complexity of Data Reviewed Labs: ordered. Radiology: ordered.  Risk OTC drugs. Prescription drug management.   This patient presents to the ED for concern of leg swelling, chest tightness this involves an extensive number of treatment options,  and is a complaint that carries with it a high risk of complications and morbidity.  The differential diagnosis includes ACS, PE, pneumothorax, CHF, DVT peripheral edema, hyperlipidemia, other   Co morbidities that complicate the patient evaluation  See HPI   Additional history obtained:  Additional history obtained from EMR External records from outside source obtained and reviewed including hospital records   Lab Tests:  I Ordered, and personally interpreted labs.  The pertinent results include: No leukocytosis.  Polycythemia with a hemoglobin of 15.9.  Placed within range.  No Electra abnormalities.  No transaminitis.  No renal dysfunction.  BNP normal.  Initial troponin of 2 with repeat 2   Imaging Studies ordered:  I ordered imaging studies including chest x-ray, CT angio chest PE, DVT ultrasound I independently visualized and interpreted imaging which showed  Chest x-ray: No acute cardiopulmonary abnormalities. CT angio chest PE:  No evidence of PE.  Adrenal nodule. DVT ultrasound: No evidence of DVT I agree with the radiologist interpretation   Cardiac Monitoring: / EKG:  The patient was maintained on a cardiac monitor.  I personally viewed and interpreted the cardiac monitored which showed an underlying rhythm of: Sinus rhythm   Consultations Obtained:  N/a   Problem List / ED Course / Critical interventions / Medication management  Lower extremity swelling, chest tightness I ordered medication including Tylenol, Pepcid, Zyrtec   Reevaluation of the patient after these medicines showed that the patient improved I have reviewed the patients home medicines and have made adjustments as needed   Social Determinants of Health:  Denies tobacco, licit drug use   Test / Admission - Considered:  Lower extremity swelling, chest tightness Vitals signs within normal range and stable throughout visit. Laboratory/imaging studies significant for: See above 42 year old  female presents emergency department with complaints of left lower extremity swelling as well as symptoms of chest tightness.  Patient with reported history of DVT not currently anticoagulated.  On exam, patient with mild lower extremity edema 1+ on left lower extremity compared to right.  Workup today overall reassuring.  DVT ultrasound negative for DVT.  Patient with delta negative troponin, lack of acute ischemic change on EKG.  Patient with heart score of 0-3.  Low suspicion for ACS.  Patient with PE study negative for PE.  No evidence of pneumonia, pneumothorax, pleural effusion or other abnormality on imaging studies of patient's chest.  Patient without evidence of chest pain radiating to back, hypertension, pulse deficits, neurologic deficits; low suspicion for dissection especially with no CT imaging concerning for send pathology.  Patient without evidence of volume overload with negative BNP; low suspicion for CHF.  Patient's chest pain seemed to improve as time elapsed while in the ED; suspect that chest tightness could be secondary to increased anxious feelings and evidence of leg swelling and history of DVT and symptoms occurred after noticing said leg swelling..  Patient without evidence of hypoalbuminemia, hypoproteinemia as cause of lower extremity swelling.  Lower extremity swelling most likely secondary to dependent edema.  Recommend elevation of legs above level of heart as well as compression socks at home.  Will recommend follow-up with primary care for reassessment of symptoms.  Treatment plan discussed with the patient and she acknowledged understanding was agreeable to said plan.  Patient overall well-appearing, afebrile in no acute distress. Worrisome signs and symptoms were discussed with the patient, and the patient acknowledged understanding to return to the ED if noticed. Patient was stable upon discharge.          Final Clinical Impression(s) / ED Diagnoses Final diagnoses:   Leg swelling    Rx / DC Orders      Peter Garter, [place] 08/20/23 2359    Glyn Ade, MD 08/21/23 1455

## 2023-08-20 NOTE — ED Notes (Signed)
Report given to the next RN.Marland KitchenMarland Kitchen

## 2023-08-20 NOTE — ED Triage Notes (Signed)
Noticed swelling in left leg 2 hr PTA. HX blood clots in both basilic veins. Mast cell activation. Some SOB.

## 2023-08-20 NOTE — Discharge Instructions (Signed)
As discussed, workup today overall reassuring.  No evidence of DVT or blood clot in your lung.  Will recommend treatment of lower extremity swelling with elevation of legs above the level of your heart as well as wearing compression socks.  Will also send in 2 doses of Lasix to take over the next couple of days to help with swelling.  Recommend follow-up with your primary care for reassessment of your symptoms.  Please do not hesitate to return if the worrisome signs and symptoms we discussed become apparent.

## 2023-08-25 ENCOUNTER — Ambulatory Visit: Payer: 59 | Attending: Internal Medicine | Admitting: Internal Medicine

## 2023-08-25 ENCOUNTER — Other Ambulatory Visit (HOSPITAL_COMMUNITY): Payer: 59

## 2023-08-25 DIAGNOSIS — G901 Familial dysautonomia [Riley-Day]: Secondary | ICD-10-CM

## 2023-08-25 DIAGNOSIS — Z950 Presence of cardiac pacemaker: Secondary | ICD-10-CM

## 2023-08-25 DIAGNOSIS — I493 Ventricular premature depolarization: Secondary | ICD-10-CM

## 2023-08-25 DIAGNOSIS — I4729 Other ventricular tachycardia: Secondary | ICD-10-CM

## 2023-08-25 DIAGNOSIS — I471 Supraventricular tachycardia, unspecified: Secondary | ICD-10-CM

## 2023-09-01 NOTE — Progress Notes (Signed)
Remote pacemaker transmission.   

## 2023-09-09 ENCOUNTER — Encounter: Payer: Self-pay | Admitting: Internal Medicine

## 2023-09-09 ENCOUNTER — Ambulatory Visit: Payer: 59 | Attending: Internal Medicine | Admitting: Internal Medicine

## 2023-09-09 VITALS — BP 126/81 | HR 100 | Ht 70.5 in | Wt 279.0 lb

## 2023-09-09 DIAGNOSIS — I4729 Other ventricular tachycardia: Secondary | ICD-10-CM | POA: Diagnosis not present

## 2023-09-09 DIAGNOSIS — I471 Supraventricular tachycardia, unspecified: Secondary | ICD-10-CM | POA: Diagnosis not present

## 2023-09-09 NOTE — Progress Notes (Signed)
Patient Care Team: Dois Davenport, MD as PCP - General (Family Medicine) Duke Salvia, MD as PCP - Electrophysiology (Cardiology) Irena Cords, Enzo Montgomery, MD as Consulting Physician (Allergy and Immunology) Linna Darner, RD as Dietitian (Family Medicine)   HPI  Erin Casey is a 42 y.o. female Seen in follow-up for a Biotronik CLS pacemaker([facility] with [facility]) implanted for symptomatic neurocardiogenic syncope.  With this, she has significant improvement in her overall symptoms.    Also symptomatic PVCs  Underwent EP testing [id number]   Recurrent alerts for high ventricular rates.  Often clustering with multiple episodes in a day  Diagnosed w mast cell disorder and has had 2 anaphylactic hospitalizations requiring epinephrine and ICU stays.  Rx w prednisone currently not  The patient denies chest pain,   nocturnal dyspnea, orthopnea or peripheral edema.  There have been syncope.  Complains of exercise limitations with specific exercises where her heart rate goes very fast and she gets short of breath and lightheaded.  Exercising now for about 4 5 months..   Date Cr K Hgb  4/21 0.72 3.9 15.1   3/23 0.73 4.1 15.0  11/24 0.71 3.8  15.9   DATE TEST EF   2/22 Echo  55-60%                   Past Medical History:  Diagnosis Date   Anemia    Depletion of volume of extracellular fluid    Dysautonomia (HCC) 2003   Essential hypertension 04/20/2013   GERD (gastroesophageal reflux disease)    Neurocardiogenic syncope    Neurocardiogenic syncope 2003   Neurologic cardiac syncope    Pacemaker-Biotronik 04/05/2012   Vitamin B12 deficiency 07/28/2018    Past Surgical History:  Procedure Laterality Date   ABDOMINAL HYSTERECTOMY     CESAREAN SECTION     CHOLECYSTECTOMY     hysterectomy     LAPAROSCOPIC GASTRIC BANDING     With reversal    PACEMAKER INSERTION     PVC ABLATION N/A 03/12/2018   Procedure: PVC ABLATION;  Surgeon: Marinus Maw, MD;   Location: [facility] INVASIVE CV LAB;  Service: Cardiovascular;  Laterality: N/A;   TONSILLECTOMY AND ADENOIDECTOMY     TUBAL LIGATION     ULNAR TUNNEL RELEASE Right 07/28/2019   Procedure: Right carpal tunnel release, right cubital tunnel release;  Surgeon: Ernest Mallick, MD;  Location: [facility] OR;  Service: Orthopedics;  Laterality: Right;    Current Outpatient Medications  Medication Sig Dispense Refill   Azelastine HCl 137 MCG/SPRAY SOLN Place 2 sprays into both nostrils 2 (two) times daily.     budesonide-formoterol (SYMBICORT) 160-4.5 MCG/ACT inhaler Inhale 2 puffs into the lungs 2 (two) times daily.     cetirizine (ZYRTEC) 10 MG tablet Take 2 tablets (20 mg total) by mouth 2 (two) times daily. 120 tablet 1   cyanocobalamin (,VITAMIN B-12,) 1000 MCG/ML injection Inject 1,000 mcg into the muscle every 7 (seven) days.     EPINEPHrine 0.3 mg/0.3 mL IJ SOAJ injection Inject 0.3 mg into the muscle as needed for anaphylaxis. 2 each 0   famotidine (PEPCID) 20 MG tablet Take 1 tablet (20 mg total) by mouth 2 (two) times daily. 60 tablet 2   hydrOXYzine (ATARAX) 50 MG tablet Take 50 mg by mouth as needed.     MAGNESIUM MALATE PO Take 4 tablets by mouth at bedtime.     methylphenidate 54 MG PO CR tablet Take  1 tablet (54 mg total) by mouth every morning. 90 tablet 0   METRONIDAZOLE, TOPICAL, 0.75 % LOTN 1 Application 2 (two) times daily.     montelukast (SINGULAIR) 10 MG tablet Take 10 mg by mouth at bedtime.     omalizumab (XOLAIR) 150 MG injection Inject 300 mg into the skin every 14 (fourteen) days.     Prenatal 27-1 MG TABS Take 1 tablet by mouth daily.     PRESCRIPTION MEDICATION Take 2 tablets by mouth as needed (anaphylactic shock/allergic reaction). Atarax [facility] by [facility] without polysorbate     No current facility-administered medications for this visit.    Allergies  Allergen Reactions   Cinnamon Anaphylaxis   Crab Extract Anaphylaxis   Egg-Derived Products Anaphylaxis    Influenza Virus Vaccine Anaphylaxis   Levaquin [Levofloxacin] Anaphylaxis   Other Anaphylaxis    Other reaction(s): Other (See Comments) Chlorahexdine cause blood clots   Pineapple Rash and Anaphylaxis    Swelling, rash, GI upset Swelling, rash, GI upset Swelling, rash, GI upset   Polysorbate Anaphylaxis   Pork-Derived Products Anaphylaxis   Sulfa Antibiotics Hives, Nausea Only and Rash   Chlorhexidine Other (See Comments)    States gets blood clots when used   Amoxicillin-Pot Clavulanate Other (See Comments)    [Derm] [GI]   Sulfamethoxazole Nausea And Vomiting and Rash      Review of Systems negative except from HPI and PMH  Physical Exam BP 126/81 (Patient Position: Standing)   Pulse 100   Ht 5' 10.5" (1.791 m)   Wt 279 lb (126.6 kg)   SpO2 98%   BMI 39.47 kg/m  Well developed and well nourished in no acute distress HENT normal Neck supple with JVP-flat Clear Device pocket well healed; without hematoma or erythema.  There is no tethering  Regular rate and rhythm, no murmur Abd-soft with active BS No Clubbing cyanosis  edema Skin-warm and dry A & Oriented  Grossly normal sensory and motor function  ECG Apacing 85 17/09/36  Device function is normal.  There were a significant number of heartbeats, I spoke to her by Excell Seltzer about Programming changes CLS was changed from high--medium See Paceart for details     Assessment and  Plan  SVT//VT- NS/PVCs noninducible at EP study 6/19  Sinus tachycardia  Pacemaker -Biotronik/CLS   Neurocardiogenic syncope/dysautonomia  Mast cell activation disorder  Adrenal tumor   Dysautonomia is relatively quiescent.  With the heart rates extending into her upper sensor rate very rapidly, will decrease the slope and hopefully that will obviate some of her symptoms and go down gradually so as to not trigger dysautonomic symptoms.  She is also continues to have episodes of atrial tachycardia versus sinus  tachycardia--spectated the latter as it seems to involve mostly out of an atrial paced rhythm suggesting that her own heart rates are being driven faster than her atrial pacing rate

## 2023-09-09 NOTE — Patient Instructions (Signed)

## 2023-09-29 ENCOUNTER — Other Ambulatory Visit (HOSPITAL_COMMUNITY): Payer: Self-pay

## 2023-10-15 ENCOUNTER — Encounter: Payer: Self-pay | Admitting: Internal Medicine

## 2023-10-20 ENCOUNTER — Other Ambulatory Visit (HOSPITAL_COMMUNITY): Payer: Self-pay

## 2023-10-27 ENCOUNTER — Telehealth: Payer: Self-pay

## 2023-10-27 NOTE — Telephone Encounter (Signed)
Patient reports not feeling as well since CLS adjustments were made. Would like her device programmed back to the way it was before. See last OV note 09/14/24 for details. Device Clinic apt made 10/28/23 @ 4:00 pm.

## 2023-10-27 NOTE — Telephone Encounter (Signed)
Pt recently had her sensitivity turned down to medium. She thinks she may need it turned back up to high. She gets SOB just going to kitchen to recliner.

## 2023-10-28 ENCOUNTER — Ambulatory Visit: Payer: 59 | Attending: Internal Medicine

## 2023-10-28 DIAGNOSIS — Z950 Presence of cardiac pacemaker: Secondary | ICD-10-CM

## 2023-10-28 NOTE — Progress Notes (Signed)
CLS adjust from Medium to High. See paceart

## 2023-11-12 ENCOUNTER — Ambulatory Visit (INDEPENDENT_AMBULATORY_CARE_PROVIDER_SITE_OTHER): Payer: 59

## 2023-11-12 DIAGNOSIS — I471 Supraventricular tachycardia, unspecified: Secondary | ICD-10-CM

## 2023-11-13 LAB — CUP PACEART REMOTE DEVICE CHECK
Date Time Interrogation Session: 20250213121605
Implantable Lead Connection Status: 753985
Implantable Lead Connection Status: 753985
Implantable Lead Implant Date: 20130708
Implantable Lead Implant Date: 20130708
Implantable Lead Location: 753859
Implantable Lead Location: 753860
Implantable Lead Model: 350
Implantable Lead Model: 350
Implantable Lead Serial Number: 29222139
Implantable Lead Serial Number: 29231638
Implantable Pulse Generator Implant Date: 20130708
Pulse Gen Serial Number: 66303845

## 2023-12-15 ENCOUNTER — Encounter: Payer: Self-pay | Admitting: Internal Medicine

## 2023-12-15 NOTE — Telephone Encounter (Signed)
**Note De-identified  [place] Obfuscation** Please advise 

## 2023-12-16 NOTE — Addendum Note (Signed)
 Addended by: Elease Etienne A on: 12/16/2023 03:21 PM   Modules accepted: Orders

## 2023-12-16 NOTE — Progress Notes (Signed)
 Remote pacemaker transmission.

## 2024-01-21 ENCOUNTER — Other Ambulatory Visit: Payer: Self-pay

## 2024-01-21 ENCOUNTER — Emergency Department (HOSPITAL_BASED_OUTPATIENT_CLINIC_OR_DEPARTMENT_OTHER)

## 2024-01-21 ENCOUNTER — Encounter (HOSPITAL_BASED_OUTPATIENT_CLINIC_OR_DEPARTMENT_OTHER): Payer: Self-pay | Admitting: Emergency Medicine

## 2024-01-21 ENCOUNTER — Emergency Department (HOSPITAL_BASED_OUTPATIENT_CLINIC_OR_DEPARTMENT_OTHER)
Admission: EM | Admit: 2024-01-21 | Discharge: 2024-01-21 | Disposition: A | Discharge status: Home / Self Care | Attending: Emergency Medicine | Admitting: Emergency Medicine

## 2024-01-21 DIAGNOSIS — Z950 Presence of cardiac pacemaker: Secondary | ICD-10-CM | POA: Insufficient documentation

## 2024-01-21 DIAGNOSIS — J45909 Unspecified asthma, uncomplicated: Secondary | ICD-10-CM | POA: Diagnosis not present

## 2024-01-21 DIAGNOSIS — R300 Dysuria: Secondary | ICD-10-CM | POA: Diagnosis not present

## 2024-01-21 DIAGNOSIS — Z7952 Long term (current) use of systemic steroids: Secondary | ICD-10-CM | POA: Insufficient documentation

## 2024-01-21 DIAGNOSIS — R55 Syncope and collapse: Secondary | ICD-10-CM | POA: Insufficient documentation

## 2024-01-21 DIAGNOSIS — R42 Dizziness and giddiness: Secondary | ICD-10-CM | POA: Insufficient documentation

## 2024-01-21 DIAGNOSIS — R519 Headache, unspecified: Secondary | ICD-10-CM | POA: Diagnosis not present

## 2024-01-21 DIAGNOSIS — R112 Nausea with vomiting, unspecified: Secondary | ICD-10-CM | POA: Diagnosis not present

## 2024-01-21 DIAGNOSIS — D72829 Elevated white blood cell count, unspecified: Secondary | ICD-10-CM | POA: Insufficient documentation

## 2024-01-21 DIAGNOSIS — Z7951 Long term (current) use of inhaled steroids: Secondary | ICD-10-CM | POA: Insufficient documentation

## 2024-01-21 LAB — URINALYSIS, MICROSCOPIC (REFLEX)

## 2024-01-21 LAB — BASIC METABOLIC PANEL WITH GFR
Anion gap: 12 (ref 5–15)
BUN: 15 mg/dL (ref 6–20)
CO2: 25 mmol/L (ref 22–32)
Calcium: 9.2 mg/dL (ref 8.9–10.3)
Chloride: 102 mmol/L (ref 98–111)
Creatinine, Ser: 0.87 mg/dL (ref 0.44–1.00)
GFR, Estimated: >60 mL/min (ref >60)
Glucose, Bld: 138 mg/dL — ABNORMAL HIGH (ref 70–99)
Potassium: 4 mmol/L (ref 3.5–5.1)
Sodium: 139 mmol/L (ref 135–145)

## 2024-01-21 LAB — CBC
HCT: 50 % — ABNORMAL HIGH (ref 36.0–46.0)
Hemoglobin: 16.7 g/dL — ABNORMAL HIGH (ref 12.0–15.0)
MCH: 30.9 pg (ref 26.0–34.0)
MCHC: 33.4 g/dL (ref 30.0–36.0)
MCV: 92.6 fL (ref 80.0–100.0)
Platelets: 197 10*3/uL (ref 150–400)
RBC: 5.4 MIL/uL — ABNORMAL HIGH (ref 3.87–5.11)
RDW: 12.8 % (ref 11.5–15.5)
WBC: 13.9 10*3/uL — ABNORMAL HIGH (ref 4.0–10.5)
nRBC: 0 % (ref 0.0–0.2)

## 2024-01-21 LAB — URINALYSIS, ROUTINE W REFLEX MICROSCOPIC
Bilirubin Urine: NEGATIVE
Glucose, UA: NEGATIVE mg/dL
Hgb urine dipstick: NEGATIVE
Ketones, ur: NEGATIVE mg/dL
Leukocytes,Ua: NEGATIVE
Nitrite: POSITIVE — AB
Protein, ur: NEGATIVE mg/dL
Specific Gravity, Urine: 1.015 (ref 1.005–1.030)
pH: 6 (ref 5.0–8.0)

## 2024-01-21 LAB — CBG MONITORING, ED: Glucose-Capillary: 148 mg/dL — ABNORMAL HIGH (ref 70–99)

## 2024-01-21 MED ORDER — DIPHENHYDRAMINE HCL 50 MG/ML IJ SOLN
25.0000 mg | Freq: Once | INTRAMUSCULAR | Status: AC
  Administered 2024-01-21: 25 mg via INTRAVENOUS
  Filled 2024-01-21: qty 1

## 2024-01-21 MED ORDER — LACTATED RINGERS IV BOLUS
500.0000 mL | Freq: Once | INTRAVENOUS | Status: AC
  Administered 2024-01-21: 500 mL via INTRAVENOUS

## 2024-01-21 MED ORDER — PREDNISONE 10 MG PO TABS: ORAL_TABLET | ORAL | 0 refills | Status: AC

## 2024-01-21 MED ORDER — CEPHALEXIN 500 MG PO CAPS: 500.0000 mg | ORAL_CAPSULE | Freq: Two times a day (BID) | ORAL | 0 refills | Status: AC

## 2024-01-21 MED ORDER — PROCHLORPERAZINE EDISYLATE 10 MG/2ML IJ SOLN
10.0000 mg | Freq: Once | INTRAMUSCULAR | Status: AC
  Administered 2024-01-21: 10 mg via INTRAVENOUS
  Filled 2024-01-21: qty 2

## 2024-01-21 NOTE — Discharge Instructions (Addendum)
 We were able to scan your head which was reassuring and negative.  All of the labs were negative as well.  We will continue with prednisone  40 mg for 5 days, then moved to 20 mg for 5 days, then 10 mg for 5 days before stopping.  We will start Keflex  to take twice a day for 7 days for suspected urinary tract infection  I recommend you follow-up with your endocrinologist as well as your asthma and allergy physician as soon as possible.  Stay hydrated as able.  Monitor for symptoms and do not hesitate to return if you have worsening or changing symptoms.

## 2024-01-21 NOTE — ED Notes (Signed)
 Spoke with Pavan from [facility]. Fax being sent. Dr. Tamela Fake notified.

## 2024-01-21 NOTE — ED Provider Notes (Addendum)
  EMERGENCY DEPARTMENT AT [facility] Provider Note   CSN: [id number] Arrival date & time: 01/21/24  1008     History  Chief Complaint  Patient presents with   Near Syncope    Erin Casey is a 43 y.o. female. The patient, with a history of moderate persistent asthma, vocal cord dysfunction, chronic idiopathic urticaria/MCAS, autoimmune endocrinopathy, neurocardiogenic syncope with pacemaker and dysautonomia presents with a headache, lightheadedness, and nausea. They report feeling like they're going to pass out, regardless of whether they're sitting or standing. These symptoms started on Saturday and have gradually worsened, with yesterday being the worst. They also report vomiting once yesterday, but deny any blood in the vomit.  The patient is currently on a steroid taper, which they believe may be causing their symptoms. They stopped the steroid yesterday and are down to twenty mg today, with plans to continue tapering over the next few days. They have previously been on longer tapers, but this time they tried a shorter one. They also have an adrenal nodule, which has been present for at least three years. The nodule is thought to be benign, but it's too dense to be certain.  In addition to these symptoms, the patient also has a history of anaphylactic reactions, which have led to hospitalizations and the use of an EpiPen . They are currently on Xolair for this, which has been effective for the most part, but they had two reactions in the past week.     Home Medications Prior to Admission medications   Medication Sig Start Date End Date Taking? Authorizing Provider  cephALEXin  (KEFLEX ) 500 MG capsule Take 1 capsule (500 mg total) by mouth 2 (two) times daily for 7 days. 01/21/24 01/28/24 Yes Ernestina Headland, MD  predniSONE  (DELTASONE ) 10 MG tablet Take 4 tablets (40 mg total) by mouth daily with breakfast for 5 days, THEN 2 tablets (20 mg total) daily with breakfast  for 5 days, THEN 1 tablet (10 mg total) daily with breakfast for 5 days. 01/21/24 02/05/24 Yes Ernestina Headland, MD  Azelastine HCl 137 MCG/SPRAY SOLN Place 2 sprays into both nostrils 2 (two) times daily.    [provider]  budesonide-formoterol  (SYMBICORT) 160-4.5 MCG/ACT inhaler Inhale 2 puffs into the lungs 2 (two) times daily.    [provider]  cetirizine  (ZYRTEC ) 10 MG tablet Take 2 tablets (20 mg total) by mouth 2 (two) times daily. 10/11/21 09/09/23  Pokhrel, Laxman, MD  cyanocobalamin  (,VITAMIN B-12,) 1000 MCG/ML injection Inject 1,000 mcg into the muscle every 7 (seven) days.    [provider]  EPINEPHrine  0.3 mg/0.3 mL IJ SOAJ injection Inject 0.3 mg into the muscle as needed for anaphylaxis. 10/07/21   Sponseller, Rebekah R, PA-C  famotidine  (PEPCID ) 20 MG tablet Take 1 tablet (20 mg total) by mouth 2 (two) times daily. 10/11/21 09/09/23  Pokhrel, Laxman, MD  hydrOXYzine (ATARAX) 50 MG tablet Take 50 mg by mouth as needed. 11/21/21   [provider]  MAGNESIUM MALATE PO Take 4 tablets by mouth at bedtime.    [provider]  methylphenidate  54 MG PO CR tablet Take 1 tablet (54 mg total) by mouth every morning. 01/11/19   Verona Goodwill, MD  METRONIDAZOLE, TOPICAL, 0.75 % LOTN 1 Application 2 (two) times daily. 07/08/22   [provider]  montelukast (SINGULAIR) 10 MG tablet Take 10 mg by mouth at bedtime. 07/08/22   [provider]  omalizumab Anitra Ket) 150 MG injection Inject 300 mg into  the skin every 14 (fourteen) days.    [provider]  Prenatal 27-1 MG TABS Take 1 tablet by mouth daily. 09/23/22   [provider]  PRESCRIPTION MEDICATION Take 2 tablets by mouth as needed (anaphylactic shock/allergic reaction). Atarax [facility] by [facility] without polysorbate    [provider]      Allergies    Cinnamon, Crab extract, Egg-derived products, Influenza virus vaccine, Levaquin [levofloxacin],  Other, Pineapple, Polysorbate, Pork-derived products, Sulfa antibiotics, Chlorhexidine , Amoxicillin-pot clavulanate, and Sulfamethoxazole    Review of Systems   Review of Systems  Constitutional:  Negative for chills and fever.  HENT:  Negative for ear pain and sore throat.   Eyes:  Negative for pain and visual disturbance.  Respiratory:  Negative for cough and shortness of breath.   Cardiovascular:  Positive for near-syncope. Negative for chest pain and palpitations.  Gastrointestinal:  Positive for nausea. Negative for abdominal pain and vomiting.  Genitourinary:  Negative for dysuria and hematuria.  Musculoskeletal:  Negative for arthralgias and back pain.  Skin:  Negative for color change and rash.  Neurological:  Positive for weakness. Negative for dizziness, seizures and syncope.  Psychiatric/Behavioral:  Negative for confusion and decreased concentration.   All other systems reviewed and are negative.   Physical Exam Updated Vital Signs BP 124/67   Pulse 78   Temp 97.7 F (36.5 C) (Oral)   Resp 20   SpO2 95%  Physical Exam Vitals reviewed.  Constitutional:      Appearance: Normal appearance.  HENT:     Head: Normocephalic.     Nose: Nose normal.     Mouth/Throat:     Mouth: Mucous membranes are moist.  Eyes:     Conjunctiva/sclera: Conjunctivae normal.  Cardiovascular:     Rate and Rhythm: Normal rate and regular rhythm.  Pulmonary:     Effort: Pulmonary effort is normal.     Breath sounds: Normal breath sounds.  Musculoskeletal:     Cervical back: Normal range of motion.  Skin:    Capillary Refill: Capillary refill takes less than 2 seconds.  Neurological:     General: No focal deficit present.     Mental Status: She is alert and oriented to person, place, and time.     Cranial Nerves: No cranial nerve deficit.     Sensory: No sensory deficit.     Motor: No weakness.  Psychiatric:        Mood and Affect: Mood normal.        Behavior: Behavior normal.      ED Results / Procedures / Treatments   Labs (all labs ordered are listed, but only abnormal results are displayed) Labs Reviewed  BASIC METABOLIC PANEL WITH GFR - Abnormal; Notable for the following components:      Result Value   Glucose, Bld 138 (*)    All other components within normal limits  CBC - Abnormal; Notable for the following components:   WBC 13.9 (*)    RBC 5.40 (*)    Hemoglobin 16.7 (*)    HCT 50.0 (*)    All other components within normal limits  URINALYSIS, ROUTINE W REFLEX MICROSCOPIC - Abnormal; Notable for the following components:   APPearance CLOUDY (*)    Nitrite POSITIVE (*)    All other components within normal limits  URINALYSIS, MICROSCOPIC (REFLEX) - Abnormal; Notable for the following components:   Bacteria, UA MANY (*)    All other components within normal limits  CBG  MONITORING, ED - Abnormal; Notable for the following components:   Glucose-Capillary 148 (*)    All other components within normal limits  URINE CULTURE    EKG See Plan   Radiology No results found.  Procedures Procedures    Medications Ordered in ED Medications - No data to display  ED Course/ Medical Decision Making/ A&P                                 Medical Decision Making Amount and/or Complexity of Data Reviewed Labs: ordered. Radiology: ordered.  Risk Prescription drug management.   Medical Decision Making:   Erin Casey is a 43 y.o. female who presented to the ED today with dizziness, presyncope, and nausea detailed above.    Patient's presentation is complicated by their history of MCAS.  Complete initial physical exam performed, notably the patient was alert and oriented without focal neurologic deficit.    Reviewed and confirmed nursing documentation for past medical history, family history, social history.    Initial Assessment:   With the patient's presentation of dizziness/headache/presyncopal event, most likely diagnosis is adrenal  cause. Other diagnoses were considered including (but not limited to) CVA/TIA, orthostatic hypotension. These are considered less likely due to history of present illness and physical exam findings.  This is most consistent with an acute complicated illness  Initial Plan:  CT head to rule out structural cause of symptoms Screening labs including CBC and Metabolic panel to evaluate for infectious or metabolic etiology of disease.  Urinalysis with reflex culture ordered to evaluate for UTI or relevant urologic/nephrologic pathology.  EKG to evaluate for cardiac pathology Objective evaluation as below reviewed   Initial Study Results:   Laboratory  All laboratory results reviewed without evidence of clinically relevant pathology.   Exceptions include: Positive nitrites in urine  EKG EKG was reviewed independently. Rate, rhythm, axis, intervals all examined and without medically relevant abnormality. ST segments without concerns for elevations.    Radiology:  Pending head CT   Final Assessment and Plan:     Dizziness: Patient presented for acute onset of dizziness, near syncope, nausea.  She believes this was related to her prednisone  taper as it was shorter than normal that she received prior for anaphylactic reaction.  Will rule out other possible causes of her symptoms.  Ordered CT head to be performed.  Additionally, ordered UA positive for nitrites - will treat for UTI as patient is symptomatic as she reports she has dysuria.  No evidence of infection or evolving sepsis on examination, hemodynamically stable.  Orthostatics pending.  EKG without acute changes.  CBG within normal limits, BMP and CBC overall unremarkable with a slight leukocytosis to 13.9.  The patient reports of what she suspects to be a benign adrenal adenoma (incidental finding on previous imaging).  Has had cortisol testing in the past in 2021 that was normal.  Advised her to follow-up with endocrinology.  Electrolytes  within normal limits and blood pressure well-maintained here.  Once CT head is obtained, we will move forward with a prolonged steroid taper.  Advised the patient to follow-up with endocrinology as well as allergy and asthma.  Anticipate disposition once all workup is completed.    Clinical Impression:  1. Near syncope      Data Unavailable          Final Clinical Impression(s) / ED Diagnoses Final diagnoses:  Near syncope  Rx / DC Orders ED Discharge Orders          Ordered    predniSONE  (DELTASONE ) 10 MG tablet  Q breakfast        01/21/24 1444    cephALEXin  (KEFLEX ) 500 MG capsule  2 times daily        01/21/24 1447              Ernestina Headland, MD 01/21/24 1451    Ernestina Headland, MD 01/21/24 1511    Scarlette Currier, MD 01/21/24 2231

## 2024-01-21 NOTE — ED Triage Notes (Signed)
 Pt POV- been  on prednisone  taper after anaphylactic rxn two weeks ago, c/o near syncope yesterday. C/o nausea, denies emesis/diarrhea.   Good po intake.  Hx of adrenal tumor. AOx4, answers delayed.

## 2024-01-21 NOTE — ED Notes (Signed)
 Called lab to add urine culture.

## 2024-01-23 LAB — URINE CULTURE: Culture: >=100000 — AB

## 2024-01-24 ENCOUNTER — Telehealth (HOSPITAL_BASED_OUTPATIENT_CLINIC_OR_DEPARTMENT_OTHER): Payer: Self-pay | Admitting: *Deleted

## 2024-01-24 NOTE — Telephone Encounter (Signed)
 Post ED Visit - Positive Culture Follow-up  Culture report reviewed by antimicrobial stewardship pharmacist: [facility] []  Court Distance, Pharm.D. []  Skeet Duke, Pharm.D., BCPS AQ-ID []  Leslee Rase, Pharm.D., BCPS []  Garland Junk, Pharm.D., BCPS []  [place], [place].D., BCPS, AAHIVP []  Alcide Aly, Pharm.D., BCPS, AAHIVP []  Jerri Morale, PharmD, BCPS []  Graham Laws, PharmD, BCPS []  Cleda Curly, PharmD, BCPS []  Tamar Fairly, PharmD []  Ballard Levels, PharmD, BCPS [x]  Dionicio Fray, PharmD  [facility] []  Arlyne Bering, PharmD []  Sherryle Don, PharmD []  Van Gelinas, PharmD []  Delila Felty, Rph []  Luna [place]) Cleora Daft, PharmD []  Augustina Block, PharmD []  Arie Kurtz, PharmD []  Sharlyn Deaner, PharmD []  Agnes Hose, PharmD []  Kendall Pauls, PharmD []  Gladstone Lamer, PharmD []  Armanda Bern, PharmD []  Tera Fellows, PharmD   Positive urine culture Treated with Cephalexin , organism sensitive to the same and no further patient follow-up is required at this time.  Erin Casey 01/24/2024, 10:19 AM

## 2024-10-12 NOTE — Progress Notes (Signed)
 Erin Casey is a 44 y.o. female seen in follow up. She was last seen on 05/18/2024.   Night sweats/hot flashes. Not sure if post-menopausal as she has history of hysterectomy. Estrogen and progesterone were added in 04/2024. She reports initially they alleviated her poor sleep and night sweats initially, however about 3-4 weeks ago these symptoms returned. She had a stomach virus last week, symptoms have resolved. Otherwise feels well.   Past Medical History:  Diagnosis Date   Dysautonomia (CMS/HHS-HCC)    Mast cell activation, unspecified (HHS-HCC)    Neurocardiogenic syncope    Pacemaker    internal   Rosacea, unspecified     Past Surgical History:  Procedure Laterality Date   CHOLECYSTECTOMY     HYSTERECTOMY     partial    Outpatient Medications Marked as Taking for the 10/12/24 encounter (Telemedicine) with Solum, Anna Melissa, MD  Medication Sig Dispense Refill   albuterol  MDI, PROVENTIL , VENTOLIN , PROAIR , HFA 90 mcg/actuation inhaler Inhale 2 inhalations into the lungs every 4 (four) hours as needed for Wheezing 3 each 1   azelastine (ASTELIN) 137 mcg nasal spray USE 1 SPRAY IN BOTH NOSTRILS  TWICE DAILY 60 mL 2   cetirizine  (ZYRTEC ) 10 MG tablet 2 TABS BY MOUTH TWICE DAILY FOR HIVES 360 tablet 0   cyanocobalamin  (VITAMIN B12) 1,000 mcg/mL injection Inject 1,000 mcg into the muscle once a week     famotidine  (PEPCID ) 20 MG tablet Take 20 mg by mouth once daily     hydrOXYzine (ATARAX) 25 MG tablet One tab at bedtime 30 tablet 12   MAGNESIUM MALATE, BULK, MISC Take 2 capsules by mouth once daily Unknown dosage     methylphenidate  HCl (CONCERTA ) 54 MG ER tablet Take 1 tablet (54 mg total) by mouth every morning 30 tablet 0   metroNIDAZOLE (METROLOTION) 0.75 % topical lotion metronidazole 0.75 % lotion     montelukast (SINGULAIR) 10 mg tablet Take 1 tablet (10 mg total) by mouth at bedtime 90 tablet 3   omalizumab (XOLAIR) 300 mg/2 mL autoinjector Inject 4 mLs (600  mg total) subcutaneously every 14 (fourteen) days 8 mL 12   progesterone (PROMETRIUM) 200 MG capsule Take 1 capsule (200 mg total) by mouth once daily 90 capsule 1   SYMBICORT 160-4.5 mcg/actuation inhaler USE 2 INHALATIONS BY MOUTH TWICE DAILY 30.6 g 1   [DISCONTINUED] estradiol (DOTTI) patch 0.025 mg/24 hr Place 1 patch onto the skin twice a week 8 patch 3   Current Facility-Administered Medications for the 10/12/24 encounter (Telemedicine) with Solum, Anna Melissa, MD  Medication Dose Route Frequency Provider Last Rate Last Admin   omalizumab (XOLAIR) vial 300 mg  300 mg Subcutaneous Q28 Days Radojicic, Cristine, MD   300 mg at 12/19/21 9071    Exam  GEN: well developed, well nourished female, in NAD.  PSYC: alert and oriented, good insight  Assessment  1. Menopausal hot flushes   2. Night sweats     Plan *Continue progesterone 200 mg qhs.  *Continue estrogen patch, however will increase dose from 0.025 mg/24 hr twice weekly to 0.0375 mg/24 hr twice weekly.   *Will see her back in follow up in 6 months or sooner if needed.  --------------------------  This video encounter was conducted with the patient's (or proxy's) verbal consent via secure, interactive audio and video telecommunications while away from clinic/office/hospital.  The patient (or proxy) was instructed to have this encounter in a suitably private space and to only have persons present to whom  they give permission to participate. In addition, patient identity was confirmed by use of name plus an additional identifier.  This visit was coded based on medical decision making (MDM).

## 2024-10-27 ENCOUNTER — Emergency Department (HOSPITAL_BASED_OUTPATIENT_CLINIC_OR_DEPARTMENT_OTHER)
Admission: EM | Admit: 2024-10-27 | Discharge: 2024-10-28 | Disposition: A | Discharge status: Home / Self Care | Attending: Emergency Medicine | Admitting: Emergency Medicine

## 2024-10-27 ENCOUNTER — Other Ambulatory Visit: Payer: Self-pay

## 2024-10-27 ENCOUNTER — Encounter (HOSPITAL_BASED_OUTPATIENT_CLINIC_OR_DEPARTMENT_OTHER): Payer: Self-pay | Admitting: Emergency Medicine

## 2024-10-27 DIAGNOSIS — R29898 Other symptoms and signs involving the musculoskeletal system: Secondary | ICD-10-CM

## 2024-10-27 DIAGNOSIS — R531 Weakness: Secondary | ICD-10-CM | POA: Diagnosis not present

## 2024-10-27 DIAGNOSIS — M79602 Pain in left arm: Secondary | ICD-10-CM | POA: Diagnosis not present

## 2024-10-27 DIAGNOSIS — Y92410 Unspecified street and highway as the place of occurrence of the external cause: Secondary | ICD-10-CM | POA: Diagnosis not present

## 2024-10-27 DIAGNOSIS — M7989 Other specified soft tissue disorders: Secondary | ICD-10-CM | POA: Insufficient documentation

## 2024-10-27 DIAGNOSIS — R200 Anesthesia of skin: Secondary | ICD-10-CM | POA: Diagnosis not present

## 2024-10-27 DIAGNOSIS — R202 Paresthesia of skin: Secondary | ICD-10-CM | POA: Diagnosis present

## 2024-10-27 DIAGNOSIS — M542 Cervicalgia: Secondary | ICD-10-CM | POA: Diagnosis not present

## 2024-10-27 LAB — CBC WITH DIFFERENTIAL/PLATELET
Abs Immature Granulocytes: 0.03 10*3/uL (ref 0.00–0.07)
Basophils Absolute: 0 10*3/uL (ref 0.0–0.1)
Basophils Relative: 0 %
Eosinophils Absolute: 0.2 10*3/uL (ref 0.0–0.5)
Eosinophils Relative: 2 %
HCT: 44.8 % (ref 36.0–46.0)
Hemoglobin: 15.3 g/dL — ABNORMAL HIGH (ref 12.0–15.0)
Immature Granulocytes: 0 %
Lymphocytes Relative: 37 %
Lymphs Abs: 3.5 10*3/uL (ref 0.7–4.0)
MCH: 30.7 pg (ref 26.0–34.0)
MCHC: 34.2 g/dL (ref 30.0–36.0)
MCV: 90 fL (ref 80.0–100.0)
Monocytes Absolute: 0.6 10*3/uL (ref 0.1–1.0)
Monocytes Relative: 7 %
Neutro Abs: 5 10*3/uL (ref 1.7–7.7)
Neutrophils Relative %: 54 %
Platelets: 228 10*3/uL (ref 150–400)
RBC: 4.98 MIL/uL (ref 3.87–5.11)
RDW: 12.4 % (ref 11.5–15.5)
WBC: 9.5 10*3/uL (ref 4.0–10.5)
nRBC: 0 % (ref 0.0–0.2)

## 2024-10-27 LAB — COMPREHENSIVE METABOLIC PANEL WITH GFR
ALT: 18 U/L (ref 0–44)
AST: 13 U/L — ABNORMAL LOW (ref 15–41)
Albumin: 3.9 g/dL (ref 3.5–5.0)
Alkaline Phosphatase: 55 U/L (ref 38–126)
Anion gap: 9 (ref 5–15)
BUN: 14 mg/dL (ref 6–20)
CO2: 24 mmol/L (ref 22–32)
Calcium: 9 mg/dL (ref 8.9–10.3)
Chloride: 105 mmol/L (ref 98–111)
Creatinine, Ser: 0.68 mg/dL (ref 0.44–1.00)
GFR, Estimated: >60 mL/min
Glucose, Bld: 139 mg/dL — ABNORMAL HIGH (ref 70–99)
Potassium: 3.9 mmol/L (ref 3.5–5.1)
Sodium: 138 mmol/L (ref 135–145)
Total Bilirubin: 0.2 mg/dL (ref 0.0–1.2)
Total Protein: 6.1 g/dL — ABNORMAL LOW (ref 6.5–8.1)
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
## 2024-10-28 ENCOUNTER — Emergency Department (HOSPITAL_COMMUNITY)

## 2024-10-28 DIAGNOSIS — R29898 Other symptoms and signs involving the musculoskeletal system: Secondary | ICD-10-CM | POA: Diagnosis not present

## 2024-10-28 DIAGNOSIS — M79602 Pain in left arm: Secondary | ICD-10-CM | POA: Diagnosis not present

## 2024-10-28 DIAGNOSIS — R200 Anesthesia of skin: Secondary | ICD-10-CM

## 2024-10-28 MED ORDER — GADOBUTROL 1 MMOL/ML IV SOLN: 10.0000 mL | Freq: Once | INTRAVENOUS | Status: DC | PRN

## 2024-10-28 MED ORDER — GADOBUTROL 1 MMOL/ML IV SOLN
6.0000 mL | Freq: Once | INTRAVENOUS | Status: AC | PRN
  Administered 2024-10-28: 6 mL via INTRAVENOUS

## 2024-10-28 MED ORDER — METHOCARBAMOL 500 MG PO TABS: 500.0000 mg | ORAL_TABLET | Freq: Two times a day (BID) | ORAL | 0 refills | Status: AC

## 2024-10-28 MED ORDER — GADOBUTROL 1 MMOL/ML IV SOLN
4.0000 mL | Freq: Once | INTRAVENOUS | Status: AC | PRN
  Administered 2024-10-28: 4 mL via INTRAVENOUS

## 2024-10-28 MED ORDER — MONTELUKAST SODIUM 10 MG PO TABS
10.0000 mg | ORAL_TABLET | Freq: Every day | ORAL | Status: DC
  Administered 2024-10-28: 10 mg via ORAL
  Filled 2024-10-28: qty 1

## 2024-10-28 MED ORDER — METHYLPREDNISOLONE 4 MG PO TBPK: ORAL_TABLET | ORAL | 0 refills | Status: AC
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
## 2024-10-28 NOTE — Discharge Instructions (Signed)
 May begin taking steroid pack as prescribed.  May take Robaxin  twice a day as needed for pain/muscle spasms.  Follow-up with primary care doctor in 1 week for recheck.  Return to ED if any symptoms worsen including syncopal episode, numbness/tingling/weakness, chest pain, difficulty breathing.

## 2024-10-28 NOTE — ED Notes (Signed)
 IV cannot be started until pt is being picked up for MRI due to chronic blood condition
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
